# Patient Record
Sex: Female | Born: 1950 | Race: White | Hispanic: No | Smoking: Current every day smoker
Health system: Southern US, Community
[De-identification: ages and names within clinical notes are randomized; demographics above are authoritative.]

## PROBLEM LIST (undated history)

## (undated) DIAGNOSIS — R42 Dizziness and giddiness: Secondary | ICD-10-CM

## (undated) DIAGNOSIS — F419 Anxiety disorder, unspecified: Secondary | ICD-10-CM

## (undated) DIAGNOSIS — I10 Essential (primary) hypertension: Secondary | ICD-10-CM

## (undated) DIAGNOSIS — F32A Depression, unspecified: Secondary | ICD-10-CM

## (undated) DIAGNOSIS — M1990 Unspecified osteoarthritis, unspecified site: Secondary | ICD-10-CM

## (undated) DIAGNOSIS — I428 Other cardiomyopathies: Secondary | ICD-10-CM

## (undated) DIAGNOSIS — I519 Heart disease, unspecified: Secondary | ICD-10-CM

## (undated) DIAGNOSIS — R634 Abnormal weight loss: Secondary | ICD-10-CM

## (undated) DIAGNOSIS — F17200 Nicotine dependence, unspecified, uncomplicated: Secondary | ICD-10-CM

## (undated) DIAGNOSIS — R233 Spontaneous ecchymoses: Secondary | ICD-10-CM

## (undated) DIAGNOSIS — J4520 Mild intermittent asthma, uncomplicated: Secondary | ICD-10-CM

## (undated) DIAGNOSIS — E7800 Pure hypercholesterolemia, unspecified: Secondary | ICD-10-CM

## (undated) HISTORY — PX: OVARIAN CYST REMOVAL: SHX89

---

## 2005-11-04 ENCOUNTER — Ambulatory Visit: Payer: Self-pay | Admitting: Family Medicine

## 2005-12-02 ENCOUNTER — Ambulatory Visit: Payer: Self-pay | Admitting: Family Medicine

## 2005-12-19 ENCOUNTER — Ambulatory Visit: Payer: Self-pay | Admitting: Gastroenterology

## 2006-01-15 ENCOUNTER — Ambulatory Visit: Payer: Self-pay | Admitting: Internal Medicine

## 2006-01-30 ENCOUNTER — Ambulatory Visit: Payer: Self-pay | Admitting: Internal Medicine

## 2006-09-23 ENCOUNTER — Ambulatory Visit: Payer: Self-pay | Admitting: Family Medicine

## 2008-09-17 ENCOUNTER — Ambulatory Visit: Payer: Self-pay | Admitting: Internal Medicine

## 2009-04-22 ENCOUNTER — Ambulatory Visit: Payer: Self-pay | Admitting: Internal Medicine

## 2010-04-18 ENCOUNTER — Ambulatory Visit: Payer: Self-pay | Admitting: Family Medicine

## 2011-01-29 ENCOUNTER — Ambulatory Visit: Payer: Self-pay | Admitting: Gastroenterology

## 2011-10-29 DIAGNOSIS — F17210 Nicotine dependence, cigarettes, uncomplicated: Secondary | ICD-10-CM | POA: Insufficient documentation

## 2011-10-29 DIAGNOSIS — I429 Cardiomyopathy, unspecified: Secondary | ICD-10-CM | POA: Insufficient documentation

## 2011-10-29 DIAGNOSIS — Z720 Tobacco use: Secondary | ICD-10-CM | POA: Insufficient documentation

## 2012-05-06 ENCOUNTER — Ambulatory Visit: Payer: Self-pay | Admitting: Family Medicine

## 2012-11-03 DIAGNOSIS — F32A Depression, unspecified: Secondary | ICD-10-CM | POA: Insufficient documentation

## 2012-11-03 DIAGNOSIS — F329 Major depressive disorder, single episode, unspecified: Secondary | ICD-10-CM | POA: Insufficient documentation

## 2013-07-22 ENCOUNTER — Ambulatory Visit: Payer: Self-pay | Admitting: Family Medicine

## 2013-08-02 ENCOUNTER — Ambulatory Visit: Payer: Self-pay

## 2013-08-05 LAB — BETA STREP CULTURE(ARMC)

## 2013-08-31 ENCOUNTER — Ambulatory Visit: Payer: Self-pay | Admitting: Gastroenterology

## 2014-07-31 ENCOUNTER — Ambulatory Visit: Payer: Self-pay | Admitting: Physician Assistant

## 2014-07-31 LAB — URINALYSIS, COMPLETE
Bacteria: NEGATIVE
Bilirubin,UR: NEGATIVE
GLUCOSE, UR: NEGATIVE
Ketone: NEGATIVE
Leukocyte Esterase: NEGATIVE
Nitrite: NEGATIVE
PH: 6 (ref 5.0–8.0)
PROTEIN: NEGATIVE
SPECIFIC GRAVITY: 1.02 (ref 1.000–1.030)

## 2014-08-02 LAB — URINE CULTURE

## 2015-05-23 ENCOUNTER — Other Ambulatory Visit: Payer: Self-pay | Admitting: Family Medicine

## 2015-05-23 DIAGNOSIS — J4521 Mild intermittent asthma with (acute) exacerbation: Secondary | ICD-10-CM

## 2015-07-09 ENCOUNTER — Ambulatory Visit
Admission: EM | Admit: 2015-07-09 | Discharge: 2015-07-09 | Disposition: A | Discharge status: Home / Self Care | Payer: 59 | Attending: Internal Medicine | Admitting: Internal Medicine

## 2015-07-09 ENCOUNTER — Encounter: Payer: Self-pay | Admitting: *Deleted

## 2015-07-09 DIAGNOSIS — I10 Essential (primary) hypertension: Secondary | ICD-10-CM | POA: Insufficient documentation

## 2015-07-09 DIAGNOSIS — R110 Nausea: Secondary | ICD-10-CM | POA: Diagnosis present

## 2015-07-09 DIAGNOSIS — F419 Anxiety disorder, unspecified: Secondary | ICD-10-CM | POA: Diagnosis not present

## 2015-07-09 DIAGNOSIS — E780 Pure hypercholesterolemia: Secondary | ICD-10-CM | POA: Diagnosis not present

## 2015-07-09 DIAGNOSIS — Z79899 Other long term (current) drug therapy: Secondary | ICD-10-CM | POA: Insufficient documentation

## 2015-07-09 DIAGNOSIS — N3001 Acute cystitis with hematuria: Secondary | ICD-10-CM | POA: Insufficient documentation

## 2015-07-09 DIAGNOSIS — R509 Fever, unspecified: Secondary | ICD-10-CM | POA: Diagnosis present

## 2015-07-09 DIAGNOSIS — M545 Low back pain: Secondary | ICD-10-CM | POA: Diagnosis present

## 2015-07-09 DIAGNOSIS — F17210 Nicotine dependence, cigarettes, uncomplicated: Secondary | ICD-10-CM | POA: Diagnosis not present

## 2015-07-09 HISTORY — DX: Anxiety disorder, unspecified: F41.9

## 2015-07-09 HISTORY — DX: Heart disease, unspecified: I51.9

## 2015-07-09 HISTORY — DX: Essential (primary) hypertension: I10

## 2015-07-09 HISTORY — DX: Pure hypercholesterolemia, unspecified: E78.00

## 2015-07-09 LAB — URINALYSIS COMPLETE WITH MICROSCOPIC (ARMC ONLY)
BACTERIA UA: NONE SEEN — AB
Bilirubin Urine: NEGATIVE
Glucose, UA: NEGATIVE mg/dL
Ketones, ur: NEGATIVE mg/dL
Leukocytes, UA: NEGATIVE
Nitrite: NEGATIVE
Protein, ur: NEGATIVE mg/dL
WBC, UA: NONE SEEN WBC/hpf (ref <3)
pH: 6.5 (ref 5.0–8.0)

## 2015-07-09 MED ORDER — NITROFURANTOIN MONOHYD MACRO 100 MG PO CAPS: 100.0000 mg | ORAL_CAPSULE | Freq: Two times a day (BID) | ORAL | Status: DC

## 2015-07-09 MED ORDER — PROMETHAZINE HCL 25 MG PO TABS: 25.0000 mg | ORAL_TABLET | Freq: Four times a day (QID) | ORAL | Status: DC | PRN

## 2015-07-09 NOTE — ED Provider Notes (Signed)
CSN: [id number]     Arrival date & time 07/09/15  1014 History   First MD Initiated Contact with Patient 07/09/15 1200     Chief Complaint  Patient presents with  . Urinary Tract Infection   HPI Patient is a 64 year old lady who presents today with malaise, fever to 101 last night. Low back discomfort, radiates forward bilaterally to the suprapubic area. Really feels achy all over. Very nauseous, but no vomiting. No diarrhea. No change in bowel habits. No dysuria, no hesitancy, no frequency, but did have an unexpected episode of urge incontinence last night. No unusual vaginal bleeding or discharge. Has a little nasal congestion and a little fullness in her anterior neck glands, but is renovating her kitchen and it's very dusty.  Past Medical History  Diagnosis Date  . Hypertension   . Anxiety   . High cholesterol   . 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 Ovarian cyst removal      History  Substance Use Topics  . Smoking status: Current Every Day Smoker -- 1.00 packs/day    Types: Cigarettes  . Smokeless tobacco: Not on file  . Alcohol Use: No    Review of Systems  All other systems reviewed and are negative.   Allergies  Review of patient's allergies indicates no known allergies.  Home Medications   Prior to Admission medications   Medication Sig Start Date End Date Taking? Authorizing Provider  atorvastatin (LIPITOR) 10 MG tablet Take 10 mg by mouth daily.   Yes Historical Provider, MD  carvedilol (COREG) 3.125 MG tablet Take 3.125 mg by mouth 2 (two) times daily with a meal.   Yes Historical Provider, MD  sertraline (ZOLOFT) 100 MG tablet Take 100 mg by mouth daily.   Yes Historical Provider, MD  Valsartan (DIOVAN PO) Take by mouth.   Yes Historical Provider, MD  montelukast (SINGULAIR) 10 MG tablet TAKE 1 TABLET BY MOUTH EVERY DAY 05/25/15   Duanne Limerick, MD                 BP 109/66 mmHg  Pulse 60   Temp(Src) 98.7 F (37.1 C) (Oral)  Resp 20  Ht  (1.676 m)  Wt 150 lb (68.04 kg)  BMI 24.22 kg/m2  SpO2 96% Physical Exam  Constitutional: She is oriented to person, place, and time. No distress.  Alert, nicely groomed  HENT:  Head: Atraumatic.  Eyes:  Conjugate gaze, no eye redness/drainage  Neck: Neck supple.  Cardiovascular: Normal rate.   Pulmonary/Chest: No respiratory distress.  Abdominal: She exhibits no distension.  Musculoskeletal: Normal range of motion.  No leg swelling  Neurological: She is alert and oriented to person, place, and time.  Skin: Skin is warm and dry.  No cyanosis  Nursing note and vitals reviewed.   ED Course  Procedures  Results for orders placed or performed during the hospital encounter of 07/09/15  Urinalysis complete, with microscopic  Result Value Ref Range   Color, Urine YELLOW YELLOW   APPearance CLEAR CLEAR   Glucose, UA NEGATIVE NEGATIVE mg/dL   Bilirubin Urine NEGATIVE NEGATIVE   Ketones, ur NEGATIVE NEGATIVE mg/dL   Specific Gravity, Urine <1.005 (L) 1.005 - 1.030   Hgb urine dipstick 2+ (A) NEGATIVE   pH 6.5 5.0 - 8.0   Protein, ur NEGATIVE NEGATIVE mg/dL   Nitrite NEGATIVE NEGATIVE   Leukocytes, UA NEGATIVE NEGATIVE   RBC /  HPF 6-30 <3 RBC/hpf   WBC, UA NONE SEEN <3 WBC/hpf   Bacteria, UA NONE SEEN (A) RARE   Squamous Epithelial / LPF 0-5 (A) RARE   Amorphous Crystal PRESENT    Urine culture pending  MDM   1. Acute cystitis with hematuria    Discharge Medication List as of 07/09/2015 12:12 PM    START taking these medications   Details  nitrofurantoin, macrocrystal-monohydrate, (MACROBID) 100 MG capsule Take 1 capsule (100 mg total) by mouth 2 (two) times daily., Starting 07/09/2015, Until Discontinued, Normal    promethazine (PHENERGAN) 25 MG tablet Take 1 tablet (25 mg total) by mouth every 6 (six) hours as needed for nausea or vomiting., Starting 07/09/2015, Until Discontinued, Normal       Recheck or followup  pcp/Dr Yetta Barre if not improving in a few days. Have urine rechecked in a few weeks to see if rbc's are still present.    Eustace Moore, MD 07/09/15 1726

## 2015-07-09 NOTE — Discharge Instructions (Signed)
Prescription for macrobid (antibiotic) sent to the [facility] in [place] Urine sample had blood in it; urine culture is pending.   Have urine rechecked for blood in a few weeks, to make sure it goes away.  Urinary Tract Infection Urinary tract infections (UTIs) can develop anywhere along your urinary tract. Your urinary tract is your body's drainage system for removing wastes and extra water. Your urinary tract includes two kidneys, two ureters, a bladder, and a urethra. Your kidneys are a pair of bean-shaped organs. Each kidney is about the size of your fist. They are located below your ribs, one on each side of your spine. CAUSES Infections are caused by microbes, which are microscopic organisms, including fungi, viruses, and bacteria. These organisms are so small that they can only be seen through a microscope. Bacteria are the microbes that most commonly cause UTIs. SYMPTOMS  Symptoms of UTIs may vary by age and gender of the patient and by the location of the infection. Symptoms in young women typically include a frequent and intense urge to urinate and a painful, burning feeling in the bladder or urethra during urination. Older women and men are more likely to be tired, shaky, and weak and have muscle aches and abdominal pain. A fever may mean the infection is in your kidneys. Other symptoms of a kidney infection include pain in your back or sides below the ribs, nausea, and vomiting. DIAGNOSIS To diagnose a UTI, your caregiver will ask you about your symptoms. Your caregiver also will ask to provide a urine sample. The urine sample will be tested for bacteria and white blood cells. White blood cells are made by your body to help fight infection. TREATMENT  Typically, UTIs can be treated with medication. Because most UTIs are caused by a bacterial infection, they usually can be treated with the use of antibiotics. The choice of antibiotic and length of treatment depend on your symptoms and the type  of bacteria causing your infection. HOME CARE INSTRUCTIONS  If you were prescribed antibiotics, take them exactly as your caregiver instructs you. Finish the medication even if you feel better after you have only taken some of the medication.  Drink enough water and fluids to keep your urine clear or pale yellow.  Avoid caffeine, tea, and carbonated beverages. They tend to irritate your bladder.  Empty your bladder often. Avoid holding urine for long periods of time.  Empty your bladder before and after sexual intercourse.  After a bowel movement, women should cleanse from front to back. Use each tissue only once. SEEK MEDICAL CARE IF:   You have back pain.  You develop a fever.  Your symptoms do not begin to resolve within 3 days. SEEK IMMEDIATE MEDICAL CARE IF:   You have severe back pain or lower abdominal pain.  You develop chills.  You have nausea or vomiting.  You have continued burning or discomfort with urination. MAKE SURE YOU:   Understand these instructions.  Will watch your condition.  Will get help right away if you are not doing well or get worse. Document Released: 08/28/2005 Document Revised: 05/19/2012 Document Reviewed: 12/27/2011 [facility] Patient Information 2015 [place], [place]. This information is not intended to replace advice given to you by your health care provider. Make sure you discuss any questions you have with your health care provider.

## 2015-07-09 NOTE — ED Notes (Signed)
Patient c/o lower back pain and lower pelvic / urge to go x yesterday.

## 2015-07-11 ENCOUNTER — Ambulatory Visit (INDEPENDENT_AMBULATORY_CARE_PROVIDER_SITE_OTHER): Payer: 59 | Admitting: Family Medicine

## 2015-07-11 ENCOUNTER — Encounter: Payer: Self-pay | Admitting: Family Medicine

## 2015-07-11 VITALS — BP 120/80 | HR 64 | Temp 100.9°F | Ht 66.0 in | Wt 147.0 lb

## 2015-07-11 DIAGNOSIS — N10 Acute tubulo-interstitial nephritis: Secondary | ICD-10-CM | POA: Diagnosis not present

## 2015-07-11 LAB — POCT URINALYSIS DIPSTICK
Bilirubin, UA: NEGATIVE
Glucose, UA: NEGATIVE
KETONES UA: NEGATIVE
Nitrite, UA: NEGATIVE
Protein, UA: NEGATIVE
Spec Grav, UA: <=1.005
UROBILINOGEN UA: 0.2
pH, UA: 6

## 2015-07-11 LAB — URINE CULTURE

## 2015-07-11 MED ORDER — LEVOFLOXACIN 500 MG PO TABS: 500.0000 mg | ORAL_TABLET | Freq: Every day | ORAL | Status: DC

## 2015-07-11 NOTE — Progress Notes (Signed)
Name: Marilyn Molina   MRN: [id number]    DOB: 1950-12-31   Date:07/11/2015       Progress Note  Subjective  Chief Complaint  Chief Complaint  Patient presents with  . Back Pain    had blood in urine  . Sinusitis    with headache    Back Pain This is a new problem. The current episode started in the past 7 days. The problem occurs daily. The problem has been waxing and waning since onset. The pain is present in the sacro-iliac. The quality of the pain is described as aching. The pain does not radiate. The pain is at a severity of 6/10. The pain is moderate. Associated symptoms include bladder incontinence and a fever. Pertinent negatives include no abdominal pain, bowel incontinence, chest pain, dysuria, headaches, leg pain, numbness, paresis, paresthesias, pelvic pain, tingling or weight loss. Treatments tried: macrobid. The treatment provided no relief.  Sinusitis This is a recurrent problem. The current episode started in the past 7 days. The problem has been waxing and waning since onset. The maximum temperature recorded prior to her arrival was 100.4 - 100.9 F. The fever has been present for 3 to 4 days. Her pain is at a severity of 4/10. Associated symptoms include chills, congestion, coughing, sinus pressure and a sore throat. Pertinent negatives include no diaphoresis, ear pain, headaches, hoarse voice, neck pain or shortness of breath. Past treatments include antibiotics.  Fever  This is a new problem. The current episode started in the past 7 days. The problem occurs daily. The problem has been waxing and waning. The maximum temperature noted was 100 to 100.9 F. Associated symptoms include congestion, coughing, diarrhea, nausea and a sore throat. Pertinent negatives include no abdominal pain, chest pain, ear pain, headaches, muscle aches, rash, urinary pain or wheezing. She has tried acetaminophen for the symptoms.    No problem-specific assessment & plan notes found for this  encounter.   Past Medical History  Diagnosis Date  . Hypertension   . Anxiety   . High cholesterol   . 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 Ovarian cyst removal      History reviewed. No pertinent family history.  History   Social History  . Marital Status: Married    Spouse Name: N/A  . Number of Children: N/A  . Years of Education: N/A   Occupational History  . Not on file.   Social History Main Topics  . Smoking status: Current Every Day Smoker -- 1.00 packs/day    Types: Cigarettes  . Smokeless tobacco: Not on file  . Alcohol Use: No  . Drug Use: No  . Sexual Activity: No   Other Topics Concern  . Not on file   Social History Narrative    No Known Allergies   Review of Systems  Constitutional: Positive for fever and chills. Negative for weight loss, malaise/fatigue and diaphoresis.  HENT: Positive for congestion, sinus pressure and sore throat. Negative for ear discharge, ear pain and hoarse voice.   Eyes: Negative for blurred vision.  Respiratory: Positive for cough. Negative for sputum production, shortness of breath and wheezing.   Cardiovascular: Negative for chest pain, palpitations and leg swelling.  Gastrointestinal: Positive for nausea and diarrhea. Negative for heartburn, abdominal pain, constipation, blood in stool, melena and bowel incontinence.  Genitourinary: Positive for bladder incontinence. Negative for dysuria, urgency, frequency, hematuria and pelvic  pain.  Musculoskeletal: Positive for back pain. Negative for myalgias, joint pain and neck pain.  Skin: Negative for rash.  Neurological: Negative for dizziness, tingling, sensory change, focal weakness, numbness, headaches and paresthesias.  Endo/Heme/Allergies: Negative for environmental allergies and polydipsia. Does not bruise/bleed easily.  Psychiatric/Behavioral: Negative for depression and suicidal ideas. The patient is not  nervous/anxious and does not have insomnia.      Objective  Filed Vitals:   07/11/15 1357  BP: 120/80  Pulse: 64  Temp: 100.9 F (38.3 C)  TempSrc: Oral  Height:  (1.676 m)  Weight: 147 lb (66.679 kg)    Physical Exam  Constitutional: She is well-developed, well-nourished, and in no distress. No distress.  HENT:  Head: Normocephalic and atraumatic.  Right Ear: External ear normal.  Left Ear: External ear normal.  Nose: Nose normal.  Mouth/Throat: Oropharynx is clear and moist.  Eyes: Conjunctivae and EOM are normal. Pupils are equal, round, and reactive to light. Right eye exhibits no discharge. Left eye exhibits no discharge.  Neck: Normal range of motion. Neck supple. No JVD present. No thyromegaly present.  Cardiovascular: Normal rate, regular rhythm, normal heart sounds and intact distal pulses.  Exam reveals no gallop and no friction rub.   No murmur heard. Pulmonary/Chest: Effort normal and breath sounds normal.  Abdominal: Soft. Bowel sounds are normal. She exhibits no mass. There is no tenderness. There is no guarding.  Musculoskeletal: Normal range of motion. She exhibits no edema.  Lymphadenopathy:    She has no cervical adenopathy.  Neurological: She is alert. She has normal reflexes.  Skin: Skin is warm and dry. She is not diaphoretic.  Psychiatric: Mood and affect normal.      Assessment & Plan  Problem List Items Addressed This Visit    None    Visit Diagnoses    Acute pyelonephritis    -  Primary    Relevant Medications    levofloxacin (LEVAQUIN) 500 MG tablet    Other Relevant Orders    CBC w/Diff/Platelet    POCT Urinalysis Dipstick    Urine Culture    Rocky mtn spotted fvr ab, IgM-blood         Dr. [facility]  07/11/2015

## 2015-07-12 ENCOUNTER — Other Ambulatory Visit: Payer: Self-pay

## 2015-07-12 LAB — CBC WITH DIFFERENTIAL/PLATELET
BASOS: 0 %
Basophils Absolute: 0 10*3/uL (ref 0.0–0.2)
EOS (ABSOLUTE): 0 10*3/uL (ref 0.0–0.4)
Eos: 0 %
HEMOGLOBIN: 13.4 g/dL (ref 11.1–15.9)
Hematocrit: 39.5 % (ref 34.0–46.6)
Immature Grans (Abs): 0 10*3/uL (ref 0.0–0.1)
Immature Granulocytes: 0 %
LYMPHS: 14 %
Lymphocytes Absolute: 1.9 10*3/uL (ref 0.7–3.1)
MCH: 30.8 pg (ref 26.6–33.0)
MCHC: 33.9 g/dL (ref 31.5–35.7)
MCV: 91 fL (ref 79–97)
MONOCYTES: 8 %
Monocytes Absolute: 1.1 10*3/uL — ABNORMAL HIGH (ref 0.1–0.9)
NEUTROS ABS: 10.5 10*3/uL — AB (ref 1.4–7.0)
Neutrophils: 78 %
Platelets: 257 10*3/uL (ref 150–379)
RBC: 4.35 x10E6/uL (ref 3.77–5.28)
RDW: 15.3 % (ref 12.3–15.4)
WBC: 13.7 10*3/uL — ABNORMAL HIGH (ref 3.4–10.8)

## 2015-07-13 ENCOUNTER — Encounter: Payer: Self-pay | Admitting: Family Medicine

## 2015-07-13 ENCOUNTER — Ambulatory Visit (INDEPENDENT_AMBULATORY_CARE_PROVIDER_SITE_OTHER): Payer: 59 | Admitting: Family Medicine

## 2015-07-13 VITALS — BP 120/64 | HR 68 | Temp 98.9°F | Ht 66.0 in | Wt 147.0 lb

## 2015-07-13 DIAGNOSIS — K5732 Diverticulitis of large intestine without perforation or abscess without bleeding: Secondary | ICD-10-CM | POA: Diagnosis not present

## 2015-07-13 LAB — ROCKY MTN SPOTTED FVR AB, IGM-BLOOD: RMSF IgM: 0.81 index (ref 0.00–0.89)

## 2015-07-13 LAB — URINE CULTURE

## 2015-07-13 MED ORDER — METRONIDAZOLE 500 MG PO TABS: 500.0000 mg | ORAL_TABLET | Freq: Three times a day (TID) | ORAL | Status: DC

## 2015-07-13 NOTE — Progress Notes (Signed)
Name: Barrett Holthaus   MRN: [id number]    DOB: 09-Oct-1951   Date:07/13/2015       Progress Note  Subjective  Chief Complaint  Chief Complaint  Patient presents with  . Fever    follow up for fever and elevated WBC count- RMSF normal    Fever  This is a new problem. The current episode started in the past 7 days. The problem has been gradually improving. The maximum temperature noted was 99 to 99.9 F. Pertinent negatives include no abdominal pain, chest pain, congestion, coughing, diarrhea, ear pain, headaches, muscle aches, nausea, rash, sleepiness, sore throat, urinary pain, vomiting or wheezing. She has tried fluids for the symptoms. The treatment provided mild relief.    No problem-specific assessment & plan notes found for this encounter.   Past Medical History  Diagnosis Date  . Hypertension   . Anxiety   . High cholesterol   . 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 Ovarian cyst removal      History reviewed. No pertinent family history.  Social History   Social History  . Marital Status: Married    Spouse Name: N/A  . Number of Children: N/A  . Years of Education: N/A   Occupational History  . Not on file.   Social History Main Topics  . Smoking status: Current Every Day Smoker -- 1.00 packs/day    Types: Cigarettes  . Smokeless tobacco: Not on file  . Alcohol Use: No  . Drug Use: No  . Sexual Activity: No   Other Topics Concern  . Not on file   Social History Narrative    No Known Allergies   Review of Systems  Constitutional: Positive for fever. Negative for chills, weight loss and malaise/fatigue.  HENT: Negative for congestion, ear discharge, ear pain and sore throat.   Eyes: Negative for blurred vision.  Respiratory: Negative for cough, sputum production, shortness of breath and wheezing.   Cardiovascular: Negative for chest pain, palpitations and leg swelling.  Gastrointestinal:  Negative for heartburn, nausea, vomiting, abdominal pain, diarrhea, constipation, blood in stool and melena.  Genitourinary: Negative for dysuria, urgency, frequency and hematuria.  Musculoskeletal: Negative for myalgias, back pain, joint pain and neck pain.  Skin: Negative for rash.  Neurological: Negative for dizziness, tingling, sensory change, focal weakness and headaches.  Endo/Heme/Allergies: Negative for environmental allergies and polydipsia. Does not bruise/bleed easily.  Psychiatric/Behavioral: Negative for depression and suicidal ideas. The patient is not nervous/anxious and does not have insomnia.      Objective  Filed Vitals:   07/13/15 1442  BP: 120/64  Pulse: 68  Temp: 98.9 F (37.2 C)  TempSrc: Oral  Height:  (1.676 m)  Weight: 147 lb (66.679 kg)    Physical Exam  Constitutional: She is well-developed, well-nourished, and in no distress. No distress.  HENT:  Head: Normocephalic and atraumatic.  Right Ear: External ear normal.  Left Ear: External ear normal.  Nose: Nose normal.  Mouth/Throat: Oropharynx is clear and moist.  Eyes: Conjunctivae and EOM are normal. Pupils are equal, round, and reactive to light. Right eye exhibits no discharge. Left eye exhibits no discharge.  Neck: Normal range of motion. Neck supple. No JVD present. No thyromegaly present.  Cardiovascular: Normal rate, regular rhythm, normal heart sounds and intact distal pulses.  Exam reveals no gallop and no friction rub.   No murmur heard. Pulmonary/Chest: Effort normal  and breath sounds normal.  Abdominal: Soft. Bowel sounds are normal. She exhibits no mass. There is tenderness in the left lower quadrant. There is no rigidity, no rebound, no guarding and no CVA tenderness.  Musculoskeletal: Normal range of motion. She exhibits no edema.  Lymphadenopathy:    She has no cervical adenopathy.  Neurological: She is alert. She has normal reflexes.  Skin: Skin is warm and dry. She is not  diaphoretic.  Psychiatric: Mood and affect normal.      Assessment & Plan  Problem List Items Addressed This Visit    None    Visit Diagnoses    Diverticulitis of large intestine without perforation or abscess without bleeding    -  Primary    Relevant Medications    metroNIDAZOLE (FLAGYL) 500 MG tablet         Dr. [facility]  07/13/2015

## 2015-07-19 IMAGING — MG MM DIGITAL SCREENING BILAT W/ CAD
1 series · 4 of 4 positions shown · non-contrast
Comparison: None.

REASON FOR EXAM: scr mammo no order
COMMENTS:

PROCEDURE:     MMM - MMM DGT SCR NO ORDER W/CAD  - July 22, 2013  [DATE]
CLINICAL DATA: Screening.
DIGITAL SCREENING BILATERAL MAMMOGRAM WITH CAD

[R CC · right · 4 of 4 slices shown]
[im 1/4]
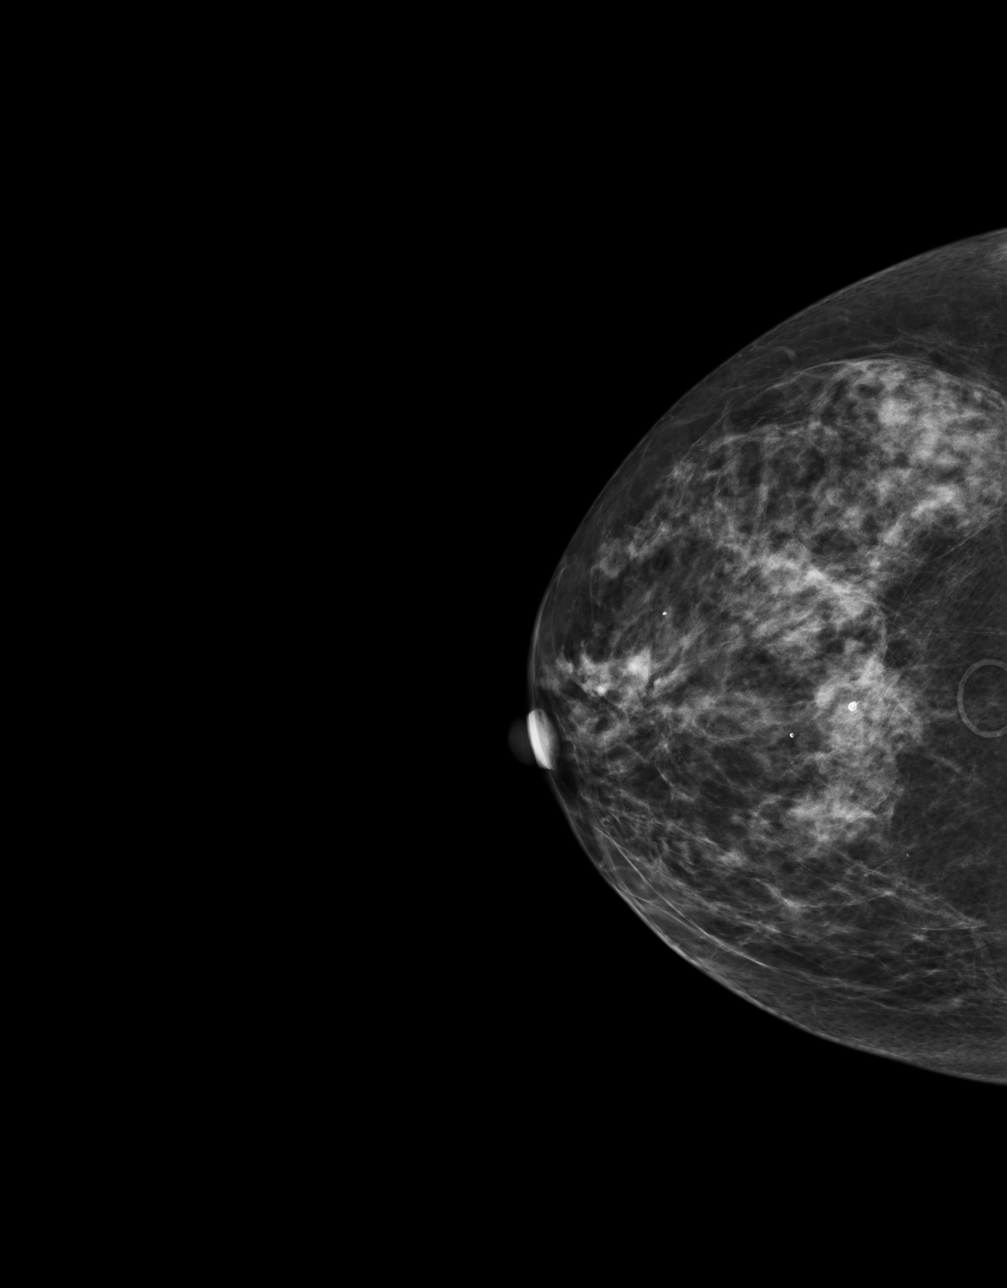
[im 2/4]
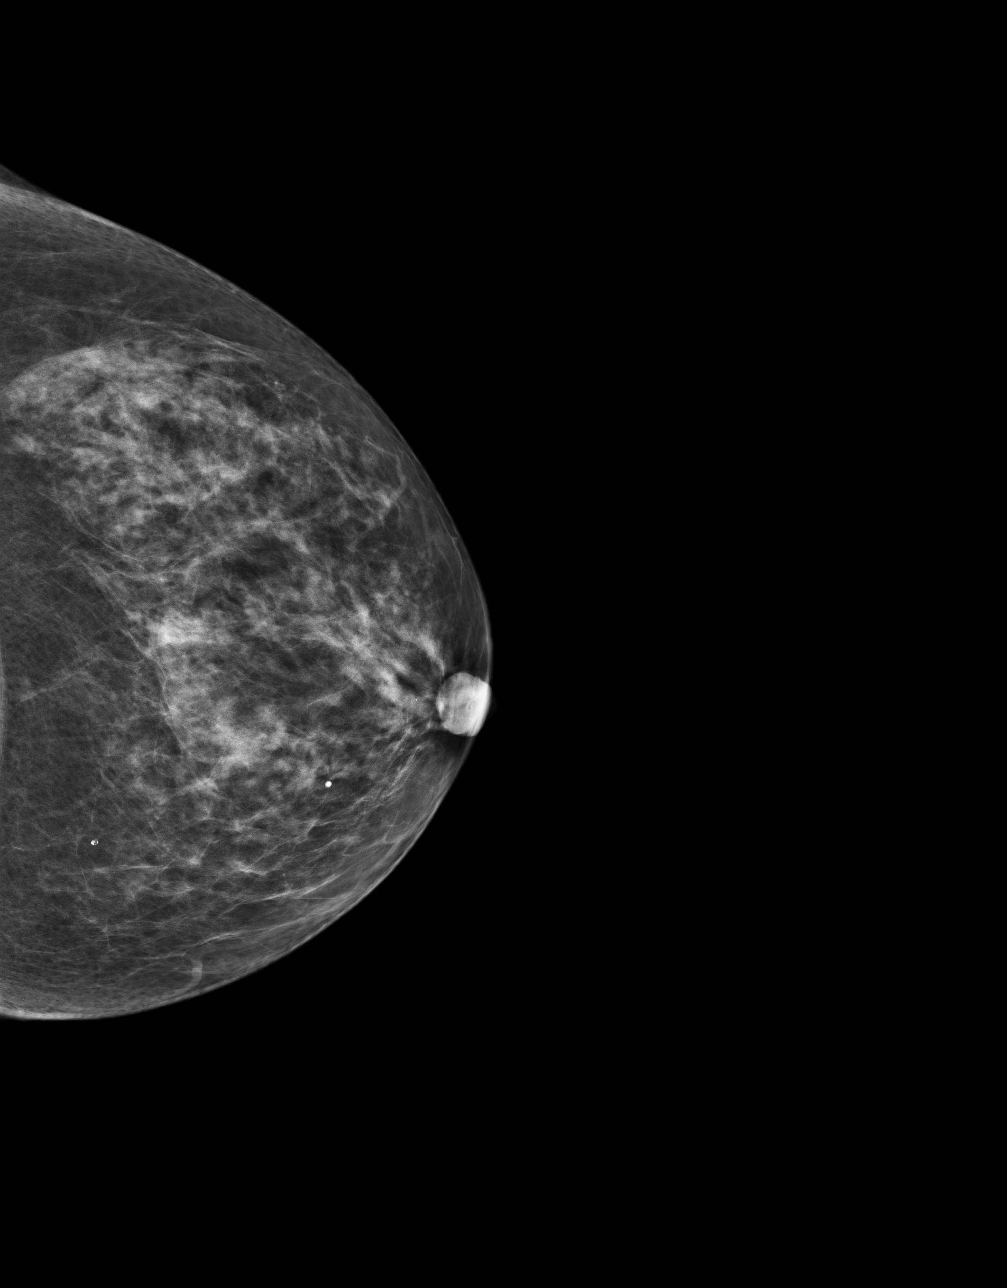
[im 3/4]
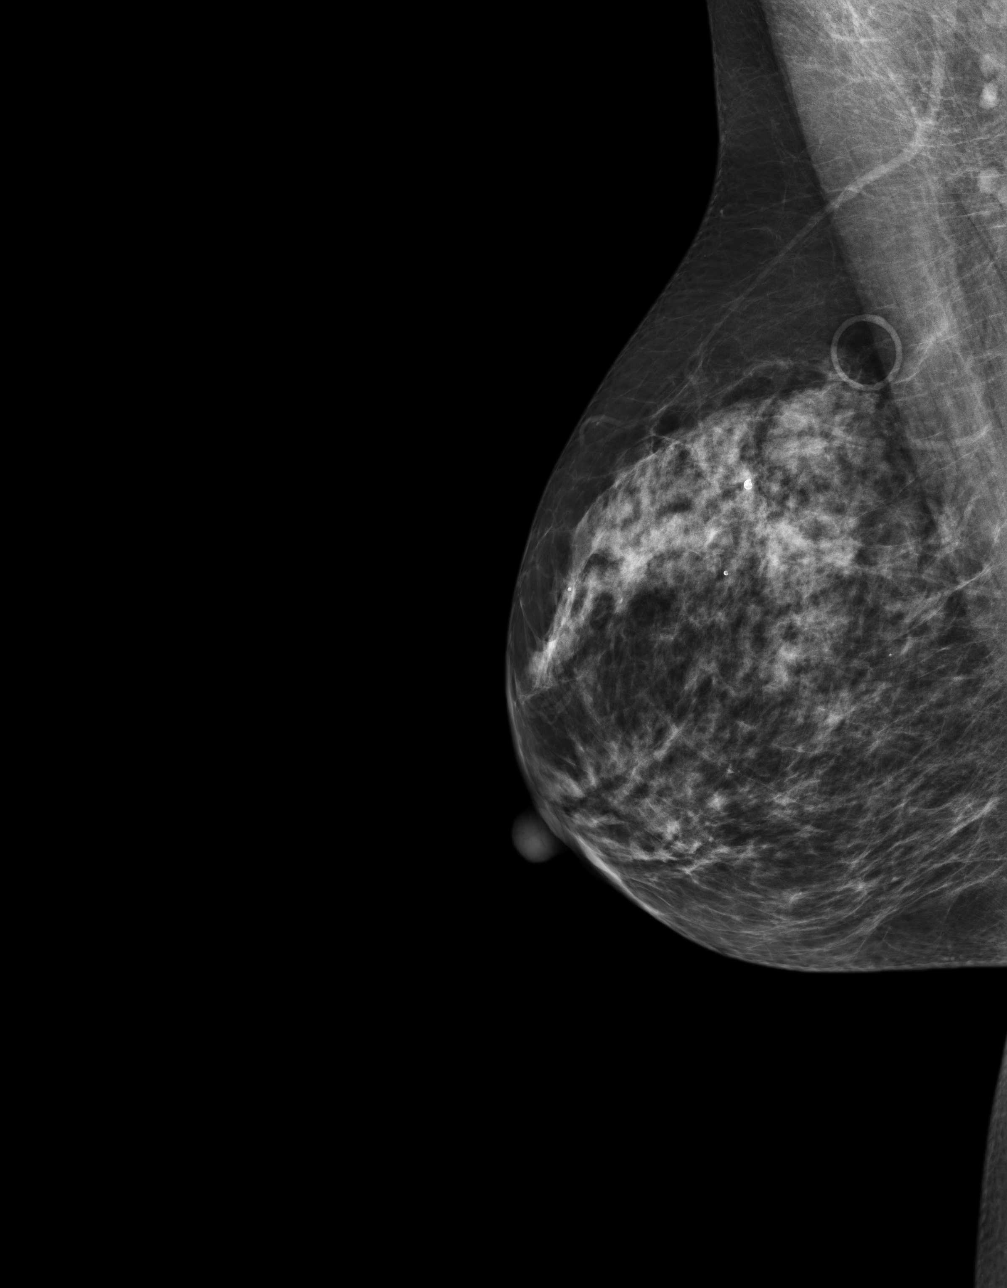
[im 4/4]
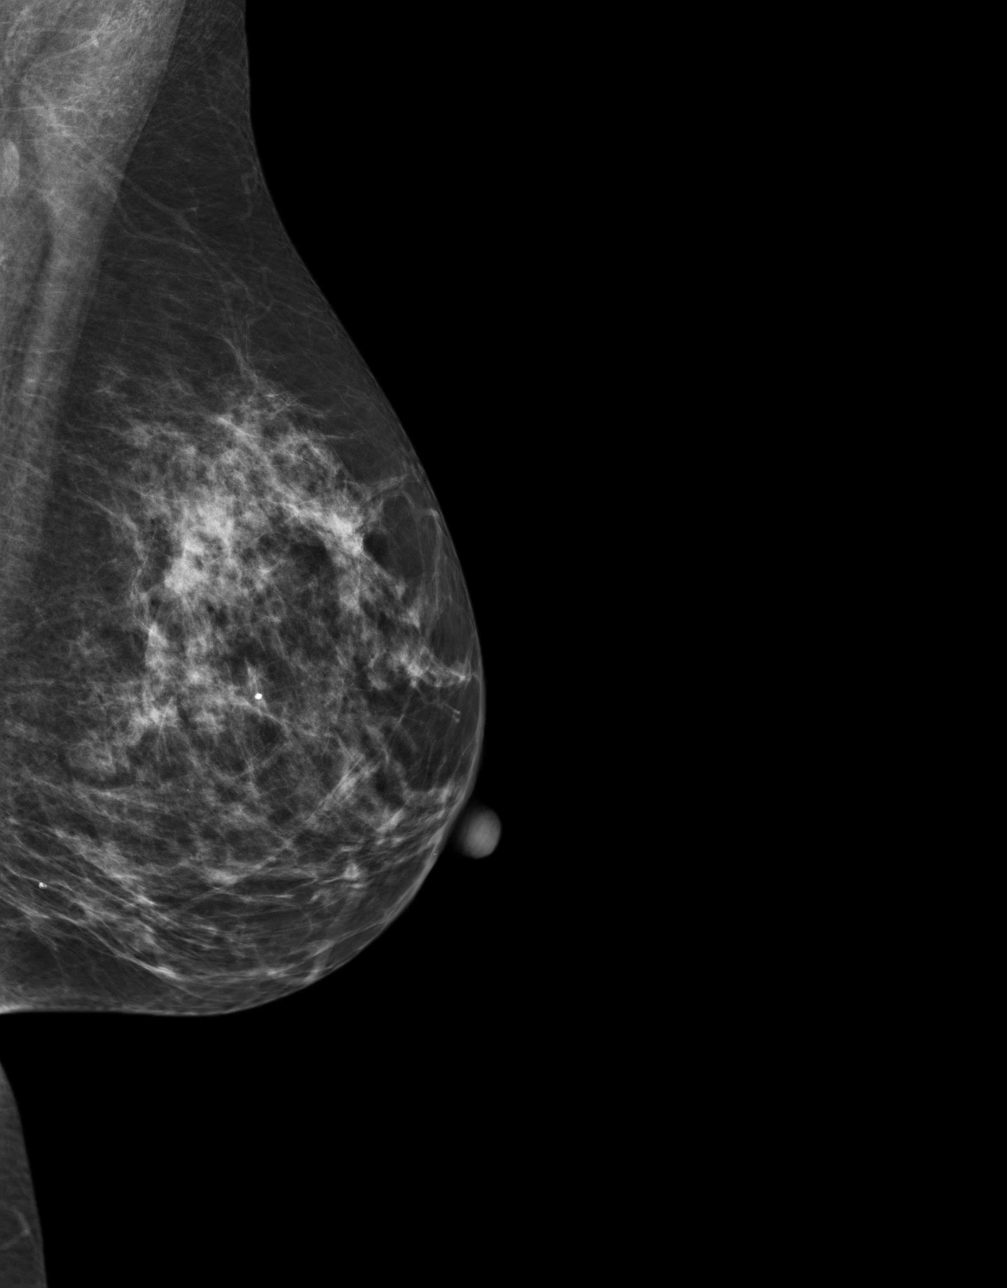

[4 of 4 positions shown; findings below may reference images not displayed]

FINDINGS: ACR Breast Density Category c:  The breast tissue is
heterogeneously dense, which may obscure small masses.

There are no findings suspicious for malignancy.

Images were processed with CAD.
IMPRESSION: No mammographic evidence of malignancy.

A result letter of this screening mammogram will be mailed directly
to the patient.

RECOMMENATION:
Screening mammogram in one year. (Code:JI-K-T8D)

BI-RADS CATEGORY 1:  Negative.

## 2015-08-28 ENCOUNTER — Other Ambulatory Visit: Payer: Self-pay | Admitting: Family Medicine

## 2015-08-28 DIAGNOSIS — F329 Major depressive disorder, single episode, unspecified: Secondary | ICD-10-CM

## 2015-08-28 DIAGNOSIS — F32A Depression, unspecified: Secondary | ICD-10-CM

## 2015-10-18 ENCOUNTER — Other Ambulatory Visit: Payer: Self-pay | Admitting: Family Medicine

## 2015-11-21 DIAGNOSIS — I10 Essential (primary) hypertension: Secondary | ICD-10-CM | POA: Insufficient documentation

## 2015-11-21 DIAGNOSIS — F419 Anxiety disorder, unspecified: Secondary | ICD-10-CM | POA: Insufficient documentation

## 2016-03-26 ENCOUNTER — Ambulatory Visit (INDEPENDENT_AMBULATORY_CARE_PROVIDER_SITE_OTHER): Payer: BLUE CROSS/BLUE SHIELD | Admitting: Family Medicine

## 2016-03-26 ENCOUNTER — Encounter: Payer: Self-pay | Admitting: Family Medicine

## 2016-03-26 VITALS — BP 120/70 | HR 78 | Ht 66.0 in | Wt 146.0 lb

## 2016-03-26 DIAGNOSIS — J0100 Acute maxillary sinusitis, unspecified: Secondary | ICD-10-CM

## 2016-03-26 DIAGNOSIS — F419 Anxiety disorder, unspecified: Principal | ICD-10-CM

## 2016-03-26 DIAGNOSIS — F418 Other specified anxiety disorders: Secondary | ICD-10-CM

## 2016-03-26 DIAGNOSIS — F329 Major depressive disorder, single episode, unspecified: Secondary | ICD-10-CM

## 2016-03-26 DIAGNOSIS — F32A Depression, unspecified: Secondary | ICD-10-CM

## 2016-03-26 MED ORDER — ALPRAZOLAM 0.25 MG PO TABS: 0.2500 mg | ORAL_TABLET | Freq: Two times a day (BID) | ORAL | Status: DC | PRN

## 2016-03-26 MED ORDER — AMOXICILLIN 500 MG PO CAPS: 500.0000 mg | ORAL_CAPSULE | Freq: Three times a day (TID) | ORAL | Status: DC

## 2016-03-26 MED ORDER — SERTRALINE HCL 50 MG PO TABS: 50.0000 mg | ORAL_TABLET | Freq: Every day | ORAL | Status: DC

## 2016-03-26 NOTE — Progress Notes (Signed)
Name: Marilyn Molina   MRN: [id number]    DOB: [birth year]   Date:03/26/2016       Progress Note  Subjective  Chief Complaint  Chief Complaint  Patient presents with  . Depression    been off med x 2 weeks- stopped abruptly because she ran out of refills  . Sinusitis    Depression        This is a recurrent problem.  The current episode started more than 1 month ago.   The onset quality is gradual.   The problem occurs constantly.  The problem has been gradually improving since onset.  Associated symptoms include no decreased concentration, no fatigue, no helplessness, no hopelessness, does not have insomnia, not irritable, no restlessness, no decreased interest, no appetite change, no body aches, no myalgias, no headaches, no indigestion, not sad and no suicidal ideas.     The symptoms are aggravated by family issues.  Past treatments include SSRIs - Selective serotonin reuptake inhibitors.  Compliance with treatment is good. Sinusitis This is a chronic problem. The current episode started more than 1 month ago. The problem has been waxing and waning since onset. There has been no fever. Associated symptoms include congestion, diaphoresis and a sore throat. Pertinent negatives include no chills, coughing, ear pain, headaches, hoarse voice, neck pain, shortness of breath, sinus pressure, sneezing or swollen glands. Past treatments include acetaminophen. The treatment provided mild relief.    No problem-specific assessment & plan notes found for this encounter.   Past Medical History  Diagnosis Date  . Hypertension   . Anxiety   . High cholesterol   . 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 Ovarian cyst removal      History reviewed. No pertinent family history.  Social History   Social History  . Marital Status: Married    Spouse Name: N/A  . Number of Children: N/A  . Years of Education: N/A   Occupational History   . Not on file.   Social History Main Topics  . Smoking status: Current Every Day Smoker -- 1.00 packs/day    Types: Cigarettes  . Smokeless tobacco: Not on file  . Alcohol Use: No  . Drug Use: No  . Sexual Activity: No   Other Topics Concern  . Not on file   Social History Narrative    No Known Allergies   Review of Systems  Constitutional: Positive for diaphoresis. Negative for fever, chills, weight loss, malaise/fatigue, appetite change and fatigue.  HENT: Positive for congestion and sore throat. Negative for ear discharge, ear pain, hoarse voice, sinus pressure and sneezing.   Eyes: Negative for blurred vision.  Respiratory: Negative for cough, sputum production, shortness of breath and wheezing.   Cardiovascular: Negative for chest pain, palpitations and leg swelling.  Gastrointestinal: Negative for heartburn, nausea, abdominal pain, diarrhea, constipation, blood in stool and melena.  Genitourinary: Negative for dysuria, urgency, frequency and hematuria.  Musculoskeletal: Negative for myalgias, back pain, joint pain and neck pain.  Skin: Negative for rash.  Neurological: Negative for dizziness, tingling, sensory change, focal weakness and headaches.  Endo/Heme/Allergies: Negative for environmental allergies and polydipsia. Does not bruise/bleed easily.  Psychiatric/Behavioral: Positive for depression. Negative for suicidal ideas and decreased concentration. The patient is not nervous/anxious and does not have insomnia.      Objective  Filed Vitals:   03/26/16 1513  BP: 120/70  Pulse: 78  Height: 5\' 6"  (1.676 m)  Weight: 146 lb (66.225 kg)    Physical Exam  Constitutional: She is well-developed, well-nourished, and in no distress. She is not irritable. No distress.  HENT:  Head: Normocephalic and atraumatic.  Right Ear: External ear normal.  Left Ear: External ear normal.  Nose: Nose normal.  Mouth/Throat: Oropharynx is clear and moist.  Eyes: Conjunctivae  and EOM are normal. Pupils are equal, round, and reactive to light. Right eye exhibits no discharge. Left eye exhibits no discharge.  Neck: Normal range of motion. Neck supple. No JVD present. No thyromegaly present.  Cardiovascular: Normal rate, regular rhythm, normal heart sounds and intact distal pulses.  Exam reveals no gallop and no friction rub.   No murmur heard. Pulmonary/Chest: Effort normal and breath sounds normal.  Abdominal: Soft. Bowel sounds are normal. She exhibits no mass. There is no tenderness. There is no guarding.  Musculoskeletal: Normal range of motion. She exhibits no edema.  Lymphadenopathy:    She has no cervical adenopathy.  Neurological: She is alert. She has normal reflexes.  Skin: Skin is warm and dry. She is not diaphoretic.  Psychiatric: Mood and affect normal.  Nursing note and vitals reviewed.     Assessment & Plan  Problem List Items Addressed This Visit      Other   Anxiety and depression - Primary   Relevant Medications   sertraline (ZOLOFT) 50 MG tablet   ALPRAZolam (XANAX) 0.25 MG tablet    Other Visit Diagnoses    Acute maxillary sinusitis, recurrence not specified        Relevant Medications    amoxicillin (AMOXIL) 500 MG capsule         Dr. [facility]  03/26/2016

## 2016-04-19 ENCOUNTER — Telehealth: Payer: Self-pay

## 2016-04-19 DIAGNOSIS — J329 Chronic sinusitis, unspecified: Secondary | ICD-10-CM

## 2016-04-19 MED ORDER — DOXYCYCLINE HYCLATE 100 MG PO TABS: 100.0000 mg | ORAL_TABLET | Freq: Two times a day (BID) | ORAL | Status: DC

## 2016-04-19 NOTE — Telephone Encounter (Signed)
Patient called saying that she is not feeling any better. She reports that she still has sinus pain, thick yellow drainage, low grade fever, and upper teeth pain. Patient was prescribed Amoxil 500mg  about 2-3 weeks ago. She is requesting that something else be called in to help with symptoms. Per Dr. Yetta Barre, ok to send in Doxycycline 100 mg BID X 10 days. Patient was advised.

## 2016-04-22 ENCOUNTER — Other Ambulatory Visit: Payer: Self-pay

## 2016-04-22 MED ORDER — SERTRALINE HCL 100 MG PO TABS: 100.0000 mg | ORAL_TABLET | Freq: Every day | ORAL | Status: DC

## 2016-04-23 ENCOUNTER — Other Ambulatory Visit: Payer: Self-pay

## 2016-05-20 ENCOUNTER — Other Ambulatory Visit: Payer: Self-pay | Admitting: Family Medicine

## 2016-07-16 ENCOUNTER — Ambulatory Visit (INDEPENDENT_AMBULATORY_CARE_PROVIDER_SITE_OTHER): Payer: BLUE CROSS/BLUE SHIELD | Admitting: Family Medicine

## 2016-07-16 ENCOUNTER — Encounter: Payer: Self-pay | Admitting: Family Medicine

## 2016-07-16 VITALS — BP 120/70 | HR 70 | Ht 66.0 in | Wt 145.0 lb

## 2016-07-16 DIAGNOSIS — J0100 Acute maxillary sinusitis, unspecified: Secondary | ICD-10-CM

## 2016-07-16 MED ORDER — AMOXICILLIN-POT CLAVULANATE 875-125 MG PO TABS: 1.0000 | ORAL_TABLET | Freq: Two times a day (BID) | ORAL | 0 refills | Status: DC

## 2016-07-16 NOTE — Progress Notes (Signed)
Name: Marilyn Molina   MRN: [id number]    DOB: 06-01-1951   Date:07/16/2016       Progress Note  Subjective  Chief Complaint  Chief Complaint  Patient presents with  . Sinusitis    swollen glands, headache, stopped up/ cong, sore throat    Sinusitis  This is a new problem. The current episode started in the past 7 days. The problem has been gradually worsening since onset. The maximum temperature recorded prior to her arrival was 100.4 - 100.9 F. The fever has been present for 1 to 2 days. The pain is mild. Associated symptoms include chills, congestion, diaphoresis, ear pain, headaches, a hoarse voice, neck pain, sinus pressure, sneezing, a sore throat and swollen glands. Pertinent negatives include no coughing or shortness of breath. Past treatments include acetaminophen. The treatment provided no relief.    No problem-specific Assessment & Plan notes found for this encounter.   Past Medical History:  Diagnosis Date  . Anxiety   . 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 High cholesterol   . Hypertension     Past Surgical History:  Procedure Laterality Date  . OVARIAN CYST REMOVAL      History reviewed. No pertinent family history.  Social History   Social History  . Marital status: Married    Spouse name: N/A  . Number of children: N/A  . Years of education: N/A   Occupational History  . Not on file.   Social History Main Topics  . Smoking status: Current Every Day Smoker    Packs/day: 1.00    Types: Cigarettes  . Smokeless tobacco: Not on file  . Alcohol use No  . Drug use: No  . Sexual activity: No   Other Topics Concern  . Not on file   Social History Narrative  . No narrative on file    No Known Allergies   Review of Systems  Constitutional: Positive for chills and diaphoresis. Negative for fever, malaise/fatigue and weight loss.  HENT: Positive for congestion, ear pain, hoarse voice, sinus pressure, sneezing and sore throat. Negative for  ear discharge.   Eyes: Negative for blurred vision.  Respiratory: Negative for cough, sputum production, shortness of breath and wheezing.   Cardiovascular: Negative for chest pain, palpitations and leg swelling.  Gastrointestinal: Negative for abdominal pain, blood in stool, constipation, diarrhea, heartburn, melena and nausea.  Genitourinary: Negative for dysuria, frequency, hematuria and urgency.  Musculoskeletal: Positive for neck pain. Negative for back pain, joint pain and myalgias.  Skin: Negative for rash.  Neurological: Positive for headaches. Negative for dizziness, tingling, sensory change and focal weakness.  Endo/Heme/Allergies: Negative for environmental allergies and polydipsia. Does not bruise/bleed easily.  Psychiatric/Behavioral: Negative for depression and suicidal ideas. The patient is not nervous/anxious and does not have insomnia.      Objective  Vitals:   07/16/16 0900  BP: 120/70  Pulse: 70  Weight: 145 lb (65.8 kg)  Height: 5\' 6"  (1.676 m)    Physical Exam  Constitutional: She is well-developed, well-nourished, and in no distress. No distress.  HENT:  Head: Normocephalic and atraumatic.  Right Ear: External ear normal.  Left Ear: External ear normal.  Nose: Nose normal.  Mouth/Throat: Oropharynx is clear and moist.  Eyes: Conjunctivae and EOM are normal. Pupils are equal, round, and reactive to light. Right eye exhibits no discharge. Left eye exhibits no discharge.  Neck: Normal range of motion. Neck supple. No JVD present. No thyromegaly present.  Cardiovascular: Normal rate, regular rhythm, normal heart sounds and intact distal pulses.  Exam reveals no gallop and no friction rub.   No murmur heard. Pulmonary/Chest: Effort normal and breath sounds normal.  Abdominal: Soft. Bowel sounds are normal. She exhibits no mass. There is no tenderness. There is no guarding.  Musculoskeletal: Normal range of motion. She exhibits no edema.  Lymphadenopathy:     She has no cervical adenopathy.  Neurological: She is alert.  Skin: Skin is warm and dry. She is not diaphoretic.  Psychiatric: Mood and affect normal.  Nursing note and vitals reviewed.     Assessment & Plan  Problem List Items Addressed This Visit    None    Visit Diagnoses    Acute maxillary sinusitis, recurrence not specified    -  Primary   Relevant Medications   amoxicillin-clavulanate (AUGMENTIN) 875-125 MG tablet        Dr. Hayden Rasmusseneanna [facility]  07/16/16

## 2016-07-25 ENCOUNTER — Other Ambulatory Visit: Payer: Self-pay | Admitting: Family Medicine

## 2016-08-27 ENCOUNTER — Other Ambulatory Visit: Payer: Self-pay | Admitting: Family Medicine

## 2016-11-11 ENCOUNTER — Other Ambulatory Visit: Payer: Self-pay | Admitting: Family Medicine

## 2017-03-17 ENCOUNTER — Ambulatory Visit (INDEPENDENT_AMBULATORY_CARE_PROVIDER_SITE_OTHER): Payer: BLUE CROSS/BLUE SHIELD | Admitting: Family Medicine

## 2017-03-17 VITALS — BP 128/74 | HR 64 | Temp 98.1°F | Ht 66.0 in | Wt 139.0 lb

## 2017-03-17 DIAGNOSIS — J0100 Acute maxillary sinusitis, unspecified: Secondary | ICD-10-CM

## 2017-03-17 MED ORDER — AMOXICILLIN-POT CLAVULANATE 875-125 MG PO TABS: 1.0000 | ORAL_TABLET | Freq: Two times a day (BID) | ORAL | 0 refills | Status: DC

## 2017-03-17 MED ORDER — GUAIFENESIN-CODEINE 100-10 MG/5ML PO SYRP: 5.0000 mL | ORAL_SOLUTION | Freq: Three times a day (TID) | ORAL | 0 refills | Status: DC | PRN

## 2017-03-17 NOTE — Progress Notes (Signed)
Name: Marilyn Molina   MRN: [id number]    DOB: 21-Apr-1951   Date:03/17/2017       Progress Note  Subjective  Chief Complaint  Chief Complaint  Patient presents with  . Headache    X 3 days   . Sinusitis    Face pressure. Fever yesterday.    Headache   This is a new problem. The current episode started in the past 7 days. The problem occurs daily. The problem has been waxing and waning. The pain is located in the frontal region. The quality of the pain is described as aching. The pain is moderate. Associated symptoms include coughing, drainage, sinus pressure and a sore throat. Pertinent negatives include no abdominal pain, back pain, blurred vision, dizziness, ear pain, fever, insomnia, nausea, neck pain, swollen glands, tingling or weight loss. She has tried acetaminophen for the symptoms. The treatment provided mild relief. There is no history of cancer, cluster headaches, hypertension, migraines in the family or sinus disease.  Sinusitis  This is a chronic problem. The problem has been gradually worsening since onset. There has been no fever. The pain is mild. Associated symptoms include congestion, coughing, headaches, sinus pressure and a sore throat. Pertinent negatives include no chills, diaphoresis, ear pain, neck pain, shortness of breath or swollen glands. Past treatments include nothing. The treatment provided mild relief.    No problem-specific Assessment & Plan notes found for this encounter.   Past Medical History:  Diagnosis Date  . Anxiety   . 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 High cholesterol   . Hypertension     Past Surgical History:  Procedure Laterality Date  . OVARIAN CYST REMOVAL      No family history on file.  Social History   Social History  . Marital status: Married    Spouse name: N/A  . Number of children: N/A  . Years of education: N/A   Occupational History  . Not on file.   Social History Main Topics  . Smoking status:  Current Every Day Smoker    Packs/day: 1.00    Types: Cigarettes  . Smokeless tobacco: Not on file  . Alcohol use No  . Drug use: No  . Sexual activity: No   Other Topics Concern  . Not on file   Social History Narrative  . No narrative on file    No Known Allergies  Outpatient Medications Prior to Visit  Medication Sig Dispense Refill  . atorvastatin (LIPITOR) 10 MG tablet Take 10 mg by mouth daily.    . carvedilol (COREG) 3.125 MG tablet Take 3.125 mg by mouth 2 (two) times daily with a meal.    . losartan (COZAAR) 50 MG tablet Take 1 tablet by mouth daily at 6 (six) AM.    . montelukast (SINGULAIR) 10 MG tablet TAKE 1 TABLET BY MOUTH EVERY DAY 30 tablet 5  . sertraline (ZOLOFT) 100 MG tablet TAKE 1 TABLET BY MOUTH DAILY (Patient not taking: Reported on 03/17/2017) 30 tablet 0  . ALPRAZolam (XANAX) 0.25 MG tablet Take 1 tablet (0.25 mg total) by mouth 2 (two) times daily as needed for anxiety. (Patient not taking: Reported on 07/16/2016) 30 tablet 1  . amoxicillin-clavulanate (AUGMENTIN) 875-125 MG tablet Take 1 tablet by mouth 2 (two) times daily. (Patient not taking: Reported on 03/17/2017) 20 tablet 0  . sertraline (ZOLOFT) 100 MG tablet TAKE 1 TABLET BY MOUTH DAILY 30 tablet 1   No facility-administered medications prior  to visit.     Review of Systems  Constitutional: Negative for chills, diaphoresis, fever, malaise/fatigue and weight loss.  HENT: Positive for congestion, sinus pressure and sore throat. Negative for ear discharge and ear pain.   Eyes: Negative for blurred vision.  Respiratory: Positive for cough. Negative for sputum production, shortness of breath and wheezing.   Cardiovascular: Negative for chest pain, palpitations and leg swelling.  Gastrointestinal: Negative for abdominal pain, blood in stool, constipation, diarrhea, heartburn, melena and nausea.  Genitourinary: Negative for dysuria, frequency, hematuria and urgency.  Musculoskeletal: Negative for back  pain, joint pain, myalgias and neck pain.  Skin: Negative for rash.  Neurological: Positive for headaches. Negative for dizziness, tingling, sensory change and focal weakness.  Endo/Heme/Allergies: Negative for environmental allergies and polydipsia. Does not bruise/bleed easily.  Psychiatric/Behavioral: Negative for depression and suicidal ideas. The patient is not nervous/anxious and does not have insomnia.      Objective  Vitals:   03/17/17 1644  BP: 128/74  Pulse: 64  Temp: 98.1 F (36.7 C)  Weight: 139 lb (63 kg)  Height: 5\' 6"  (1.676 m)    Physical Exam  Constitutional: She is well-developed, well-nourished, and in no distress. No distress.  HENT:  Head: Normocephalic and atraumatic.  Right Ear: External ear normal.  Left Ear: External ear normal.  Nose: Nose normal.  Mouth/Throat: Oropharynx is clear and moist.  Eyes: Conjunctivae and EOM are normal. Pupils are equal, round, and reactive to light. Right eye exhibits no discharge. Left eye exhibits no discharge.  Neck: Normal range of motion. Neck supple. No JVD present. No thyromegaly present.  Cardiovascular: Normal rate, regular rhythm, normal heart sounds and intact distal pulses.  Exam reveals no gallop and no friction rub.   No murmur heard. Pulmonary/Chest: Effort normal and breath sounds normal. She has no wheezes. She has no rales.  Abdominal: Soft. Bowel sounds are normal. She exhibits no mass. There is no tenderness. There is no guarding.  Musculoskeletal: Normal range of motion. She exhibits no edema.  Lymphadenopathy:    She has no cervical adenopathy.  Neurological: She is alert. She has normal reflexes.  Skin: Skin is warm and dry. She is not diaphoretic.  Psychiatric: Mood and affect normal.  Nursing note and vitals reviewed.     Assessment & Plan  Problem List Items Addressed This Visit    None    Visit Diagnoses    Acute maxillary sinusitis, recurrence not specified    -  Primary   Relevant  Medications   amoxicillin-clavulanate (AUGMENTIN) 875-125 MG tablet   guaiFENesin-codeine (ROBITUSSIN AC) 100-10 MG/5ML syrup      Meds ordered this encounter  Medications  . amoxicillin-clavulanate (AUGMENTIN) 875-125 MG tablet    Sig: Take 1 tablet by mouth 2 (two) times daily.    Dispense:  20 tablet    Refill:  0  . guaiFENesin-codeine (ROBITUSSIN AC) 100-10 MG/5ML syrup    Sig: Take 5 mLs by mouth 3 (three) times daily as needed for cough.    Dispense:  150 mL    Refill:  0      Dr. [facility]den [facility]  03/17/17

## 2017-05-08 ENCOUNTER — Telehealth: Payer: Self-pay

## 2017-05-08 ENCOUNTER — Other Ambulatory Visit: Payer: Self-pay

## 2017-05-08 MED ORDER — AZITHROMYCIN 250 MG PO TABS: ORAL_TABLET | ORAL | 0 refills | Status: DC

## 2017-05-08 NOTE — Telephone Encounter (Signed)
Pt called wanting an antibiotic called in- going [place] the beach. Sent in ZPack to [facility]- will see if not better

## 2017-07-22 ENCOUNTER — Other Ambulatory Visit: Payer: Self-pay

## 2017-07-25 ENCOUNTER — Other Ambulatory Visit
Admission: RE | Admit: 2017-07-25 | Discharge: 2017-07-25 | Disposition: A | Discharge status: Home / Self Care | Payer: Medicare HMO | Source: Ambulatory Visit | Attending: Family Medicine | Admitting: Family Medicine

## 2017-07-25 ENCOUNTER — Ambulatory Visit (INDEPENDENT_AMBULATORY_CARE_PROVIDER_SITE_OTHER): Payer: BLUE CROSS/BLUE SHIELD | Admitting: Family Medicine

## 2017-07-25 ENCOUNTER — Encounter: Payer: Self-pay | Admitting: Family Medicine

## 2017-07-25 VITALS — BP 130/70 | HR 64 | Ht 66.0 in | Wt 138.0 lb

## 2017-07-25 DIAGNOSIS — Z0000 Encounter for general adult medical examination without abnormal findings: Secondary | ICD-10-CM | POA: Diagnosis not present

## 2017-07-25 DIAGNOSIS — Z1231 Encounter for screening mammogram for malignant neoplasm of breast: Secondary | ICD-10-CM | POA: Diagnosis not present

## 2017-07-25 DIAGNOSIS — M40204 Unspecified kyphosis, thoracic region: Secondary | ICD-10-CM

## 2017-07-25 DIAGNOSIS — F329 Major depressive disorder, single episode, unspecified: Secondary | ICD-10-CM | POA: Diagnosis not present

## 2017-07-25 DIAGNOSIS — F419 Anxiety disorder, unspecified: Secondary | ICD-10-CM

## 2017-07-25 DIAGNOSIS — I10 Essential (primary) hypertension: Secondary | ICD-10-CM

## 2017-07-25 DIAGNOSIS — F32A Depression, unspecified: Secondary | ICD-10-CM

## 2017-07-25 DIAGNOSIS — E785 Hyperlipidemia, unspecified: Secondary | ICD-10-CM | POA: Diagnosis not present

## 2017-07-25 DIAGNOSIS — Z124 Encounter for screening for malignant neoplasm of cervix: Secondary | ICD-10-CM | POA: Diagnosis not present

## 2017-07-25 DIAGNOSIS — Z1239 Encounter for other screening for malignant neoplasm of breast: Secondary | ICD-10-CM

## 2017-07-25 DIAGNOSIS — Z79899 Other long term (current) drug therapy: Secondary | ICD-10-CM | POA: Diagnosis not present

## 2017-07-25 LAB — RENAL FUNCTION PANEL
Albumin: 4 g/dL (ref 3.5–5.0)
Anion gap: 5 (ref 5–15)
BUN: 17 mg/dL (ref 6–20)
CALCIUM: 9.3 mg/dL (ref 8.9–10.3)
CHLORIDE: 105 mmol/L (ref 101–111)
CO2: 29 mmol/L (ref 22–32)
Creatinine, Ser: 0.67 mg/dL (ref 0.44–1.00)
GFR calc non Af Amer: >60 mL/min (ref >60)
Glucose, Bld: 99 mg/dL (ref 65–99)
Phosphorus: 3.9 mg/dL (ref 2.5–4.6)
Potassium: 4.4 mmol/L (ref 3.5–5.1)
Sodium: 139 mmol/L (ref 135–145)

## 2017-07-25 LAB — LIPID PANEL
CHOL/HDL RATIO: 3.7 ratio
Cholesterol: 175 mg/dL (ref 0–200)
HDL: 47 mg/dL (ref >40)
LDL Cholesterol: 98 mg/dL (ref 0–99)
TRIGLYCERIDES: 151 mg/dL — AB (ref <150)
VLDL: 30 mg/dL (ref 0–40)

## 2017-07-25 LAB — HEPATIC FUNCTION PANEL
ALBUMIN: 4 g/dL (ref 3.5–5.0)
ALT: 12 U/L — ABNORMAL LOW (ref 14–54)
AST: 16 U/L (ref 15–41)
Alkaline Phosphatase: 66 U/L (ref 38–126)
BILIRUBIN TOTAL: 0.4 mg/dL (ref 0.3–1.2)
Bilirubin, Direct: <0.1 mg/dL — ABNORMAL LOW (ref 0.1–0.5)
TOTAL PROTEIN: 7.4 g/dL (ref 6.5–8.1)

## 2017-07-25 MED ORDER — SERTRALINE HCL 100 MG PO TABS: 100.0000 mg | ORAL_TABLET | Freq: Every day | ORAL | 3 refills | Status: DC

## 2017-07-25 MED ORDER — CARVEDILOL 3.125 MG PO TABS: 3.1250 mg | ORAL_TABLET | Freq: Two times a day (BID) | ORAL | 3 refills | Status: DC

## 2017-07-25 MED ORDER — LOSARTAN POTASSIUM 50 MG PO TABS: 50.0000 mg | ORAL_TABLET | Freq: Every day | ORAL | 3 refills | Status: DC

## 2017-07-25 MED ORDER — ATORVASTATIN CALCIUM 10 MG PO TABS: 10.0000 mg | ORAL_TABLET | Freq: Every day | ORAL | 3 refills | Status: DC

## 2017-07-25 NOTE — Progress Notes (Signed)
Name: Marilyn Molina   MRN: [id number]    DOB: 25-Apr-1951   Date:07/25/2017       Progress Note  Subjective  Chief Complaint  Chief Complaint  Patient presents with  . Annual Exam    pap, mammo, TDAP    Patient presents for annual physical exam.     No problem-specific Assessment & Plan notes found for this encounter.   Past Medical History:  Diagnosis Date  . Anxiety   . 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 High cholesterol   . Hypertension     Past Surgical History:  Procedure Laterality Date  . OVARIAN CYST REMOVAL      No family history on file.  Social History   Social History  . Marital status: Married    Spouse name: N/A  . Number of children: N/A  . Years of education: N/A   Occupational History  . Not on file.   Social History Main Topics  . Smoking status: Current Every Day Smoker    Packs/day: 1.00    Types: Cigarettes  . Smokeless tobacco: Never Used  . Alcohol use No  . Drug use: No  . Sexual activity: No   Other Topics Concern  . Not on file   Social History Narrative  . No narrative on file    No Known Allergies  Outpatient Medications Prior to Visit  Medication Sig Dispense Refill  . montelukast (SINGULAIR) 10 MG tablet TAKE 1 TABLET BY MOUTH EVERY DAY 30 tablet 5  . atorvastatin (LIPITOR) 10 MG tablet Take 10 mg by mouth daily.    . carvedilol (COREG) 3.125 MG tablet Take 3.125 mg by mouth 2 (two) times daily with a meal.    . losartan (COZAAR) 50 MG tablet Take 1 tablet by mouth daily at 6 (six) AM.    . sertraline (ZOLOFT) 100 MG tablet TAKE 1 TABLET BY MOUTH DAILY 30 tablet 0  . amoxicillin-clavulanate (AUGMENTIN) 875-125 MG tablet Take 1 tablet by mouth 2 (two) times daily. 20 tablet 0  . azithromycin (ZITHROMAX) 250 MG tablet Use as directed 6 tablet 0  . guaiFENesin-codeine (ROBITUSSIN AC) 100-10 MG/5ML syrup Take 5 mLs by mouth 3 (three) times daily as needed for cough. 150 mL 0   No facility-administered  medications prior to visit.     Review of Systems  Constitutional: Negative for chills, fever, malaise/fatigue and weight loss.  HENT: Negative for ear discharge, ear pain and sore throat.   Eyes: Negative for blurred vision.  Respiratory: Negative for cough, sputum production, shortness of breath and wheezing.   Cardiovascular: Negative for chest pain, palpitations and leg swelling.  Gastrointestinal: Negative for abdominal pain, blood in stool, constipation, diarrhea, heartburn, melena and nausea.  Genitourinary: Negative for dysuria, frequency, hematuria and urgency.  Musculoskeletal: Negative for back pain, joint pain, myalgias and neck pain.  Skin: Negative for rash.  Neurological: Negative for dizziness, tingling, sensory change, focal weakness and headaches.  Endo/Heme/Allergies: Negative for environmental allergies and polydipsia. Does not bruise/bleed easily.  Psychiatric/Behavioral: Negative for depression and suicidal ideas. The patient is not nervous/anxious and does not have insomnia.      Objective  Vitals:   07/25/17 0949  BP: 130/70  Pulse: 64  Weight: 138 lb (62.6 kg)  Height: 5\' 6"  (1.676 m)    Physical Exam  Constitutional: She is well-developed, well-nourished, and in no distress. No distress.  HENT:  Head: Normocephalic and atraumatic.  Right Ear: External  ear normal.  Left Ear: External ear normal.  Nose: Nose normal.  Mouth/Throat: Oropharynx is clear and moist.  Eyes: Pupils are equal, round, and reactive to light. Conjunctivae and EOM are normal. Right eye exhibits no discharge. Left eye exhibits no discharge.  Neck: Normal range of motion. Neck supple. No JVD present. No thyromegaly present.  Cardiovascular: Normal rate, regular rhythm, normal heart sounds and intact distal pulses.  Exam reveals no gallop and no friction rub.   No murmur heard. Pulmonary/Chest: Effort normal and breath sounds normal. No respiratory distress. She has no wheezes. She  has no rales. She exhibits no tenderness. Right breast exhibits no inverted nipple, no mass, no nipple discharge, no skin change and no tenderness. Left breast exhibits no inverted nipple, no mass, no nipple discharge, no skin change and no tenderness. Breasts are symmetrical.  Abdominal: Soft. Bowel sounds are normal. She exhibits no distension and no mass. There is no tenderness. There is no rebound and no guarding.  Genitourinary: Rectum normal, vagina normal, uterus normal, cervix normal, right adnexa normal, left adnexa normal and vulva normal. Rectal exam shows guaiac negative stool. No vaginal discharge found.  Musculoskeletal: Normal range of motion. She exhibits no edema.  Lymphadenopathy:    She has no cervical adenopathy.  Neurological: She is alert. She has normal reflexes.  Skin: Skin is warm and dry. She is not diaphoretic.  Psychiatric: Mood and affect normal.  Nursing note and vitals reviewed.     Assessment & Plan  Problem List Items Addressed This Visit      Other   Anxiety and depression    Other Visit Diagnoses    Annual physical exam    -  Primary   Relevant Orders   Pap IG (Image Guided)   Hyperlipidemia, unspecified hyperlipidemia type       Relevant Medications   losartan (COZAAR) 50 MG tablet   atorvastatin (LIPITOR) 10 MG tablet   carvedilol (COREG) 3.125 MG tablet   Essential hypertension       Relevant Medications   losartan (COZAAR) 50 MG tablet   atorvastatin (LIPITOR) 10 MG tablet   carvedilol (COREG) 3.125 MG tablet   Kyphosis of thoracic region, unspecified kyphosis type       Relevant Orders   DG Bone Density   Breast screening       Relevant Orders   MM Digital Screening   Cervical cancer screening       Relevant Orders   Pap IG (Image Guided)      Meds ordered this encounter  Medications  . sertraline (ZOLOFT) 100 MG tablet    Sig: Take 1 tablet (100 mg total) by mouth daily.    Dispense:  90 tablet    Refill:  3  . losartan  (COZAAR) 50 MG tablet    Sig: Take 1 tablet (50 mg total) by mouth daily at 6 (six) AM.    Dispense:  90 tablet    Refill:  3  . atorvastatin (LIPITOR) 10 MG tablet    Sig: Take 1 tablet (10 mg total) by mouth daily.    Dispense:  90 tablet    Refill:  3  . carvedilol (COREG) 3.125 MG tablet    Sig: Take 1 tablet (3.125 mg total) by mouth 2 (two) times daily with a meal.    Dispense:  180 tablet    Refill:  3      Dr. [facility]den [facility]  07/25/17   

## 2017-07-28 DIAGNOSIS — R69 Illness, unspecified: Secondary | ICD-10-CM | POA: Diagnosis not present

## 2017-07-28 LAB — PAP IG (IMAGE GUIDED): PAP Smear Comment: 0

## 2017-07-28 LAB — PLEASE NOTE

## 2017-08-01 ENCOUNTER — Encounter: Payer: BLUE CROSS/BLUE SHIELD | Admitting: Family Medicine

## 2017-08-20 ENCOUNTER — Ambulatory Visit
Admission: RE | Admit: 2017-08-20 | Discharge: 2017-08-20 | Disposition: A | Discharge status: Home / Self Care | Payer: Medicare HMO | Source: Ambulatory Visit | Attending: Family Medicine | Admitting: Family Medicine

## 2017-08-20 ENCOUNTER — Ambulatory Visit (INDEPENDENT_AMBULATORY_CARE_PROVIDER_SITE_OTHER): Payer: Medicare HMO | Admitting: Family Medicine

## 2017-08-20 ENCOUNTER — Encounter: Payer: Self-pay | Admitting: Family Medicine

## 2017-08-20 VITALS — BP 120/64 | HR 64 | Ht 66.0 in | Wt 134.0 lb

## 2017-08-20 DIAGNOSIS — Z23 Encounter for immunization: Secondary | ICD-10-CM | POA: Diagnosis not present

## 2017-08-20 DIAGNOSIS — J181 Lobar pneumonia, unspecified organism: Secondary | ICD-10-CM | POA: Diagnosis not present

## 2017-08-20 DIAGNOSIS — J189 Pneumonia, unspecified organism: Secondary | ICD-10-CM

## 2017-08-20 DIAGNOSIS — R05 Cough: Secondary | ICD-10-CM | POA: Insufficient documentation

## 2017-08-20 DIAGNOSIS — J439 Emphysema, unspecified: Secondary | ICD-10-CM | POA: Diagnosis not present

## 2017-08-20 DIAGNOSIS — R059 Cough, unspecified: Secondary | ICD-10-CM

## 2017-08-20 MED ORDER — AMOXICILLIN-POT CLAVULANATE 875-125 MG PO TABS: 1.0000 | ORAL_TABLET | Freq: Two times a day (BID) | ORAL | 0 refills | Status: DC

## 2017-08-20 MED ORDER — GUAIFENESIN-CODEINE 100-10 MG/5ML PO SYRP: 5.0000 mL | ORAL_SOLUTION | Freq: Three times a day (TID) | ORAL | 0 refills | Status: DC | PRN

## 2017-08-20 NOTE — Progress Notes (Signed)
Name: Marilyn Molina   MRN: [id number]    DOB: 1951/09/26   Date:08/20/2017       Progress Note  Subjective  Chief Complaint  Chief Complaint  Patient presents with  . Sinusitis    cough and cong    Sinusitis  This is a new problem. The current episode started in the past 7 days. The problem has been waxing and waning since onset. There has been no fever. The pain is mild. Pertinent negatives include no chills, congestion, coughing, diaphoresis, ear pain, headaches, hoarse voice, neck pain, shortness of breath, sinus pressure, sneezing, sore throat or swollen glands. Past treatments include nothing. The treatment provided mild relief.    No problem-specific Assessment & Plan notes found for this encounter.   Past Medical History:  Diagnosis Date  . Anxiety   . 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 High cholesterol   . Hypertension     Past Surgical History:  Procedure Laterality Date  . OVARIAN CYST REMOVAL      No family history on file.  Social History   Social History  . Marital status: Married    Spouse name: N/A  . Number of children: N/A  . Years of education: N/A   Occupational History  . Not on file.   Social History Main Topics  . Smoking status: Current Every Day Smoker    Packs/day: 1.00    Types: Cigarettes  . Smokeless tobacco: Never Used  . Alcohol use No  . Drug use: No  . Sexual activity: No   Other Topics Concern  . Not on file   Social History Narrative  . No narrative on file    No Known Allergies  Outpatient Medications Prior to Visit  Medication Sig Dispense Refill  . atorvastatin (LIPITOR) 10 MG tablet Take 1 tablet (10 mg total) by mouth daily. 90 tablet 3  . carvedilol (COREG) 3.125 MG tablet Take 1 tablet (3.125 mg total) by mouth 2 (two) times daily with a meal. 180 tablet 3  . losartan (COZAAR) 50 MG tablet Take 1 tablet (50 mg total) by mouth daily at 6 (six) AM. 90 tablet 3  . montelukast (SINGULAIR) 10 MG tablet  TAKE 1 TABLET BY MOUTH EVERY DAY 30 tablet 5  . sertraline (ZOLOFT) 100 MG tablet Take 1 tablet (100 mg total) by mouth daily. 90 tablet 3   No facility-administered medications prior to visit.     Review of Systems  Constitutional: Negative for chills, diaphoresis, fever, malaise/fatigue and weight loss.  HENT: Negative for congestion, ear discharge, ear pain, hoarse voice, sinus pressure, sneezing and sore throat.   Eyes: Negative for blurred vision.  Respiratory: Negative for cough, sputum production, shortness of breath and wheezing.   Cardiovascular: Negative for chest pain, palpitations and leg swelling.  Gastrointestinal: Negative for abdominal pain, blood in stool, constipation, diarrhea, heartburn, melena and nausea.  Genitourinary: Negative for dysuria, frequency, hematuria and urgency.  Musculoskeletal: Negative for back pain, joint pain, myalgias and neck pain.  Skin: Negative for rash.  Neurological: Negative for dizziness, tingling, sensory change, focal weakness and headaches.  Endo/Heme/Allergies: Negative for environmental allergies and polydipsia. Does not bruise/bleed easily.  Psychiatric/Behavioral: Negative for depression and suicidal ideas. The patient is not nervous/anxious and does not have insomnia.      Objective  Vitals:   08/20/17 1052  BP: 120/64  Pulse: 64  Weight: 134 lb (60.8 kg)  Height:  (1.676 m)  Physical Exam  Constitutional: She is well-developed, well-nourished, and in no distress. No distress.  HENT:  Head: Normocephalic and atraumatic.  Right Ear: External ear normal.  Left Ear: External ear normal.  Nose: Nose normal.  Mouth/Throat: Oropharynx is clear and moist.  Eyes: Pupils are equal, round, and reactive to light. Conjunctivae and EOM are normal. Right eye exhibits no discharge. Left eye exhibits no discharge.  Neck: Normal range of motion. Neck supple. No JVD present. No thyromegaly present.  Cardiovascular: Normal rate,  regular rhythm, normal heart sounds and intact distal pulses.  Exam reveals no gallop and no friction rub.   No murmur heard. Pulmonary/Chest: Effort normal. She has decreased breath sounds in the left lower field. She has wheezes. She has rales.  Abdominal: Soft. Bowel sounds are normal. She exhibits no mass. There is no tenderness. There is no guarding.  Musculoskeletal: Normal range of motion. She exhibits no edema.  Lymphadenopathy:    She has no cervical adenopathy.  Neurological: She is alert. She has normal reflexes.  Skin: Skin is warm and dry. She is not diaphoretic.  Psychiatric: Mood and affect normal.  Nursing note and vitals reviewed.     Assessment & Plan  Problem List Items Addressed This Visit    None    Visit Diagnoses    Pneumonia of left lower lobe due to infectious organism [facility])    -  Primary   Relevant Medications   amoxicillin-clavulanate (AUGMENTIN) 875-125 MG tablet   guaiFENesin-codeine (ROBITUSSIN AC) 100-10 MG/5ML syrup   Other Relevant Orders   DG Chest 2 View   Cough       Relevant Orders   DG Chest 2 View   Influenza vaccine needed       Relevant Orders   Flu Vaccine QUAD 6+ mos PF IM (Fluarix Quad PF) (Completed)      Meds ordered this encounter  Medications  . amoxicillin-clavulanate (AUGMENTIN) 875-125 MG tablet    Sig: Take 1 tablet by mouth 2 (two) times daily.    Dispense:  20 tablet    Refill:  0  . guaiFENesin-codeine (ROBITUSSIN AC) 100-10 MG/5ML syrup    Sig: Take 5 mLs by mouth 3 (three) times daily as needed for cough.    Dispense:  150 mL    Refill:  0      Dr. [facility]  08/20/17

## 2017-08-27 ENCOUNTER — Other Ambulatory Visit: Payer: Self-pay

## 2017-08-27 ENCOUNTER — Ambulatory Visit: Payer: Self-pay

## 2017-09-10 ENCOUNTER — Ambulatory Visit
Admission: RE | Admit: 2017-09-10 | Discharge: 2017-09-10 | Disposition: A | Discharge status: Home / Self Care | Payer: Medicare HMO | Source: Ambulatory Visit | Attending: Family Medicine | Admitting: Family Medicine

## 2017-09-10 DIAGNOSIS — Z780 Asymptomatic menopausal state: Secondary | ICD-10-CM | POA: Diagnosis not present

## 2017-09-10 DIAGNOSIS — Z1239 Encounter for other screening for malignant neoplasm of breast: Secondary | ICD-10-CM

## 2017-09-10 DIAGNOSIS — M8589 Other specified disorders of bone density and structure, multiple sites: Secondary | ICD-10-CM | POA: Diagnosis not present

## 2017-09-10 DIAGNOSIS — M85851 Other specified disorders of bone density and structure, right thigh: Secondary | ICD-10-CM | POA: Insufficient documentation

## 2017-09-10 DIAGNOSIS — M40204 Unspecified kyphosis, thoracic region: Secondary | ICD-10-CM | POA: Diagnosis not present

## 2017-09-10 DIAGNOSIS — Z1231 Encounter for screening mammogram for malignant neoplasm of breast: Secondary | ICD-10-CM | POA: Diagnosis not present

## 2017-09-10 DIAGNOSIS — M8588 Other specified disorders of bone density and structure, other site: Secondary | ICD-10-CM | POA: Insufficient documentation

## 2017-11-11 DIAGNOSIS — I429 Cardiomyopathy, unspecified: Secondary | ICD-10-CM | POA: Diagnosis not present

## 2017-11-11 DIAGNOSIS — Z720 Tobacco use: Secondary | ICD-10-CM | POA: Diagnosis not present

## 2017-11-11 DIAGNOSIS — I10 Essential (primary) hypertension: Secondary | ICD-10-CM | POA: Diagnosis not present

## 2017-11-24 ENCOUNTER — Ambulatory Visit
Admission: EM | Admit: 2017-11-24 | Discharge: 2017-11-24 | Disposition: A | Discharge status: Home / Self Care | Payer: Medicare HMO | Attending: Family Medicine | Admitting: Family Medicine

## 2017-11-24 ENCOUNTER — Encounter: Payer: Self-pay | Admitting: *Deleted

## 2017-11-24 DIAGNOSIS — N390 Urinary tract infection, site not specified: Secondary | ICD-10-CM

## 2017-11-24 DIAGNOSIS — R300 Dysuria: Secondary | ICD-10-CM | POA: Diagnosis not present

## 2017-11-24 DIAGNOSIS — R109 Unspecified abdominal pain: Secondary | ICD-10-CM

## 2017-11-24 DIAGNOSIS — R112 Nausea with vomiting, unspecified: Secondary | ICD-10-CM

## 2017-11-24 LAB — URINALYSIS, COMPLETE (UACMP) WITH MICROSCOPIC
Bilirubin Urine: NEGATIVE
Glucose, UA: NEGATIVE mg/dL
Ketones, ur: NEGATIVE mg/dL
Nitrite: POSITIVE — AB
PROTEIN: NEGATIVE mg/dL
SPECIFIC GRAVITY, URINE: 1.02 (ref 1.005–1.030)
SQUAMOUS EPITHELIAL / LPF: NONE SEEN
pH: 5.5 (ref 5.0–8.0)

## 2017-11-24 MED ORDER — PHENAZOPYRIDINE HCL 200 MG PO TABS: 200.0000 mg | ORAL_TABLET | Freq: Three times a day (TID) | ORAL | 0 refills | Status: DC

## 2017-11-24 MED ORDER — NITROFURANTOIN MONOHYD MACRO 100 MG PO CAPS: 100.0000 mg | ORAL_CAPSULE | Freq: Two times a day (BID) | ORAL | 0 refills | Status: DC

## 2017-11-24 MED ORDER — ONDANSETRON 8 MG PO TBDP: 8.0000 mg | ORAL_TABLET | Freq: Two times a day (BID) | ORAL | 0 refills | Status: DC

## 2017-11-24 NOTE — ED Triage Notes (Signed)
Suprapubic abd pain, nausea, dysuria, since Sat.

## 2017-11-24 NOTE — ED Provider Notes (Addendum)
[facility]    CSN: [id number] Arrival date & time: 11/24/17  0818     History   Chief Complaint Chief Complaint  Patient presents with  . Abdominal Pain  . Nausea    HPI Marilyn Molina is a 66 y.o. female.   HPI  Is a 66 year old female presents with nausea vomiting dysuria and suprapubic abdominal pain which she indicates pelvic has had 2 days. Denies fever or chills.  No vaginal discharge or vaginal irritation.  UTI in 2016 that was seen here treated and improved without recurrent until present.  He is a chronic tobacco abuser.          Past Medical History:  Diagnosis Date  . Anxiety   . 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 High cholesterol   . Hypertension     Patient Active Problem List   Diagnosis Date Noted  . Anxiety and depression 11/03/2012  . Cardiomyopathy (HCC) 10/29/2011  . Compulsive tobacco user syndrome 10/29/2011    Past Surgical History:  Procedure Laterality Date  . OVARIAN CYST REMOVAL      OB History    No data available       Home Medications    Prior to Admission medications   Medication Sig Start Date End Date Taking? Authorizing Provider  atorvastatin (LIPITOR) 10 MG tablet Take 1 tablet (10 mg total) by mouth daily. 07/25/17  Yes Duanne Limerick, MD  carvedilol (COREG) 3.125 MG tablet Take 1 tablet (3.125 mg total) by mouth 2 (two) times daily with a meal. 07/25/17  Yes Duanne Limerick, MD  losartan (COZAAR) 50 MG tablet Take 1 tablet (50 mg total) by mouth daily at 6 (six) AM. 07/25/17  Yes Duanne Limerick, MD  montelukast (SINGULAIR) 10 MG tablet TAKE 1 TABLET BY MOUTH EVERY DAY 05/25/15  Yes Duanne Limerick, MD  sertraline (ZOLOFT) 100 MG tablet Take 1 tablet (100 mg total) by mouth daily. 07/25/17  Yes Duanne Limerick, MD  nitrofurantoin, macrocrystal-monohydrate, (MACROBID) 100 MG capsule Take 1 capsule (100 mg total) by mouth 2 (two) times daily. 11/24/17   Lutricia Feil, PA-C  ondansetron  (ZOFRAN ODT) 8 MG disintegrating tablet Take 1 tablet (8 mg total) by mouth 2 (two) times daily. Take for nausea 11/24/17   Lutricia Feil, PA-C  phenazopyridine (PYRIDIUM) 200 MG tablet Take 1 tablet (200 mg total) by mouth 3 (three) times daily. 11/24/17   Lutricia Feil, PA-C    Family History Family History  Problem Relation Age of Onset  . Supraventricular tachycardia Mother   . Stroke Father     Social History Social History   Tobacco Use  . Smoking status: Current Every Day Smoker    Packs/day: 1.00    Types: Cigarettes  . Smokeless tobacco: Never Used  Substance Use Topics  . Alcohol use: No  . Drug use: No     Allergies   Patient has no known allergies.   Review of Systems Review of Systems  Constitutional: Positive for activity change. Negative for appetite change, chills, fatigue and fever.  Genitourinary: Positive for dysuria and frequency. Negative for vaginal bleeding, vaginal discharge and vaginal pain.  All other systems reviewed and are negative.    Physical Exam Triage Vital Signs ED Triage Vitals  Enc Vitals Group     BP 11/24/17 0850 130/79     Pulse Rate 11/24/17 0850 78     Resp 11/24/17 0850  16     Temp 11/24/17 0850 98.1 F (36.7 C)     Temp Source 11/24/17 0850 Oral     SpO2 11/24/17 0850 96 %     Weight 11/24/17 0852 132 lb (59.9 kg)     Height 11/24/17 0852 5\' 6"  (1.676 m)     Head Circumference --      Peak Flow --      Pain Score 11/24/17 0853 8     Pain Loc --      Pain Edu? --      Excl. in GC? --    No data found.  Updated Vital Signs BP 130/79 (BP Location: Left Arm)   Pulse 78   Temp 98.1 F (36.7 C) (Oral)   Resp 16   Ht 5\' 6"  (1.676 m)   Wt 132 lb (59.9 kg)   SpO2 96%   BMI 21.31 kg/m   Visual Acuity Right Eye Distance:   Left Eye Distance:   Bilateral Distance:    Right Eye Near:   Left Eye Near:    Bilateral Near:     Physical Exam  Constitutional: She is oriented to person, place, and time.  She appears well-developed and well-nourished.  HENT:  Head: Normocephalic.  Mouth/Throat: Oropharyngeal exudate present.  Eyes: Pupils are equal, round, and reactive to light.  Pulmonary/Chest: Effort normal and breath sounds normal.  Abdominal: Bowel sounds are normal. There is tenderness. There is no rigidity, no rebound, no guarding, no CVA tenderness and negative Murphy's sign.  Patient has mild tenderness suprapubically left and right.  There is no rebound no guarding present.  Bowel sounds are normal.  Neurological: She is alert and oriented to person, place, and time.  Skin: Skin is warm and dry.  Psychiatric: She has a normal mood and affect. Her behavior is normal.  Nursing note and vitals reviewed.    UC Treatments / Results  Labs (all labs ordered are listed, but only abnormal results are displayed) Labs Reviewed  URINALYSIS, COMPLETE (UACMP) WITH MICROSCOPIC - Abnormal; Notable for the following components:      Result Value   Color, Urine STRAW (*)    APPearance HAZY (*)    Hgb urine dipstick TRACE (*)    Nitrite POSITIVE (*)    Leukocytes, UA TRACE (*)    Bacteria, UA MANY (*)    All other components within normal limits  URINE CULTURE    EKG  EKG Interpretation None       Radiology No results found.  Procedures Procedures (including critical care time)  Medications Ordered in UC Medications - No data to display   Initial Impression / Assessment and Plan / UC Course  I have reviewed the triage vital signs and the nursing notes.  Pertinent labs & imaging results that were available during my care of the patient were reviewed by me and considered in my medical decision making (see chart for details).     Plan: 1. Test/x-ray results and diagnosis reviewed with patient 2. rx as per orders; risks, benefits, potential side effects reviewed with patient 3. Recommend supportive treatment with drinking plenty of fluids.  We will provide Zofran for  nausea pyridium for a dysuria, place  the patient on Macrobid.  Cultures will be available in 48 hours.  He runs high fevers or has increased nausea or vomiting go to the emergency room. 4. F/u prn if symptoms worsen or don't improve   Final Clinical Impressions(s) / UC Diagnoses  Final diagnoses:  Lower urinary tract infectious disease    ED Discharge Orders        Ordered    ondansetron (ZOFRAN ODT) 8 MG disintegrating tablet  2 times daily     11/24/17 0948    nitrofurantoin, macrocrystal-monohydrate, (MACROBID) 100 MG capsule  2 times daily     11/24/17 0950    phenazopyridine (PYRIDIUM) 200 MG tablet  3 times daily     11/24/17 0950       Controlled Substance Prescriptions [place] Controlled Substance Registry consulted? Not Applicable   Lutricia Feil, PA-C 11/24/17 1001    Lutricia Feil, PA-C 11/24/17 [phone]

## 2017-11-24 NOTE — Discharge Instructions (Signed)
Drink plenty of fluids.  Have a [facility] Christmas

## 2017-11-26 LAB — URINE CULTURE: Culture: >=100000 — AB

## 2017-11-27 ENCOUNTER — Telehealth (HOSPITAL_COMMUNITY): Payer: Self-pay | Admitting: Internal Medicine

## 2017-11-27 MED ORDER — CEPHALEXIN 500 MG PO CAPS: 500.0000 mg | ORAL_CAPSULE | Freq: Two times a day (BID) | ORAL | 0 refills | Status: AC

## 2017-11-27 NOTE — Telephone Encounter (Signed)
Clinical staff, please let patient know that urine culture was positive for Klebsiella germ, intermediate sensitivity to nitrofurantoin rx given at the urgent care visit.  Would stop nitrofurantoin and send rx for cephalexin 500mg  bid x 5d #10 no refills.  Recheck or followup with PCP for further evaluation if symptoms are not improving.  LM

## 2017-12-18 ENCOUNTER — Other Ambulatory Visit: Payer: Self-pay

## 2017-12-31 ENCOUNTER — Other Ambulatory Visit: Payer: Self-pay

## 2017-12-31 DIAGNOSIS — N390 Urinary tract infection, site not specified: Secondary | ICD-10-CM

## 2017-12-31 MED ORDER — CIPROFLOXACIN HCL 250 MG PO TABS: 250.0000 mg | ORAL_TABLET | Freq: Two times a day (BID) | ORAL | 0 refills | Status: DC

## 2018-01-01 ENCOUNTER — Encounter: Payer: Self-pay | Admitting: Family Medicine

## 2018-01-01 ENCOUNTER — Ambulatory Visit (INDEPENDENT_AMBULATORY_CARE_PROVIDER_SITE_OTHER): Payer: Medicare Other | Admitting: Family Medicine

## 2018-01-01 VITALS — BP 120/60 | HR 80 | Ht 66.0 in | Wt 132.0 lb

## 2018-01-01 DIAGNOSIS — F17210 Nicotine dependence, cigarettes, uncomplicated: Secondary | ICD-10-CM | POA: Diagnosis not present

## 2018-01-01 DIAGNOSIS — Z1159 Encounter for screening for other viral diseases: Secondary | ICD-10-CM

## 2018-01-01 DIAGNOSIS — Z23 Encounter for immunization: Secondary | ICD-10-CM | POA: Diagnosis not present

## 2018-01-01 DIAGNOSIS — N3000 Acute cystitis without hematuria: Secondary | ICD-10-CM | POA: Diagnosis not present

## 2018-01-01 LAB — POCT URINALYSIS DIPSTICK
BILIRUBIN UA: NEGATIVE
Glucose, UA: NEGATIVE
Ketones, UA: NEGATIVE
Nitrite, UA: NEGATIVE
PH UA: 5 (ref 5.0–8.0)
PROTEIN UA: NEGATIVE
Spec Grav, UA: 1.01 (ref 1.010–1.025)
UROBILINOGEN UA: 0.2 U/dL

## 2018-01-01 NOTE — Progress Notes (Signed)
Name: Marilyn Molina   MRN: [id number]    DOB: Aug 16, 1951   Date:01/01/2018       Progress Note  Subjective  Chief Complaint  Chief Complaint  Patient presents with  . Urinary Tract Infection    feeling a little bit better    Urinary Tract Infection   This is a new problem. The current episode started 1 to 4 weeks ago (seen in urgent care). The problem occurs every urination. The problem has been gradually improving. The quality of the pain is described as burning. The pain is mild. Associated symptoms include chills and frequency. Pertinent negatives include no discharge, flank pain, hematuria, hesitancy, nausea, sweats, urgency or vomiting. The treatment provided moderate (started cipro ) relief. There is no history of catheterization, kidney stones, recurrent UTIs, a single kidney, urinary stasis or a urological procedure.    No problem-specific Assessment & Plan notes found for this encounter.   Past Medical History:  Diagnosis Date  . Anxiety   . 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 High cholesterol   . Hypertension     Past Surgical History:  Procedure Laterality Date  . OVARIAN CYST REMOVAL      Family History  Problem Relation Age of Onset  . Supraventricular tachycardia Mother   . Stroke Father     Social History   Socioeconomic History  . Marital status: Widowed    Spouse name: Not on file  . Number of children: Not on file  . Years of education: Not on file  . Highest education level: Not on file  Social Needs  . Financial resource strain: Not on file  . Food insecurity - worry: Not on file  . Food insecurity - inability: Not on file  . Transportation needs - medical: Not on file  . Transportation needs - non-medical: Not on file  Occupational History  . Not on file  Tobacco Use  . Smoking status: Current Every Day Smoker    Packs/day: 1.00    Types: Cigarettes  . Smokeless tobacco: Never Used  Substance and Sexual Activity  . Alcohol  use: No  . Drug use: No  . Sexual activity: No  Other Topics Concern  . Not on file  Social History Narrative  . Not on file    No Known Allergies  Outpatient Medications Prior to Visit  Medication Sig Dispense Refill  . atorvastatin (LIPITOR) 10 MG tablet Take 1 tablet (10 mg total) by mouth daily. 90 tablet 3  . carvedilol (COREG) 3.125 MG tablet Take 1 tablet (3.125 mg total) by mouth 2 (two) times daily with a meal. 180 tablet 3  . ciprofloxacin (CIPRO) 250 MG tablet Take 1 tablet (250 mg total) by mouth 2 (two) times daily. 6 tablet 0  . losartan (COZAAR) 50 MG tablet Take 1 tablet (50 mg total) by mouth daily at 6 (six) AM. 90 tablet 3  . montelukast (SINGULAIR) 10 MG tablet TAKE 1 TABLET BY MOUTH EVERY DAY 30 tablet 5  . sertraline (ZOLOFT) 100 MG tablet Take 1 tablet (100 mg total) by mouth daily. 90 tablet 3  . nitrofurantoin, macrocrystal-monohydrate, (MACROBID) 100 MG capsule Take 1 capsule (100 mg total) by mouth 2 (two) times daily. 10 capsule 0  . ondansetron (ZOFRAN ODT) 8 MG disintegrating tablet Take 1 tablet (8 mg total) by mouth 2 (two) times daily. Take for nausea 6 tablet 0  . phenazopyridine (PYRIDIUM) 200 MG tablet Take 1 tablet (200  mg total) by mouth 3 (three) times daily. 6 tablet 0   No facility-administered medications prior to visit.     Review of Systems  Constitutional: Positive for chills. Negative for fever, malaise/fatigue and weight loss.  HENT: Negative for ear discharge, ear pain and sore throat.   Eyes: Negative for blurred vision.  Respiratory: Negative for cough, sputum production, shortness of breath and wheezing.   Cardiovascular: Negative for chest pain, palpitations and leg swelling.  Gastrointestinal: Negative for abdominal pain, blood in stool, constipation, diarrhea, heartburn, melena, nausea and vomiting.  Genitourinary: Positive for frequency. Negative for dysuria, flank pain, hematuria, hesitancy and urgency.  Musculoskeletal:  Negative for back pain, joint pain, myalgias and neck pain.  Skin: Negative for rash.  Neurological: Negative for dizziness, tingling, sensory change, focal weakness and headaches.  Endo/Heme/Allergies: Negative for environmental allergies and polydipsia. Does not bruise/bleed easily.  Psychiatric/Behavioral: Negative for depression and suicidal ideas. The patient is not nervous/anxious and does not have insomnia.      Objective  Vitals:   01/01/18 0907  BP: 120/60  Pulse: 80  Weight: 132 lb (59.9 kg)  Height: 5\' 6"  (1.676 m)    Physical Exam  Constitutional: She is well-developed, well-nourished, and in no distress. No distress.  HENT:  Head: Normocephalic and atraumatic.  Right Ear: External ear normal.  Left Ear: External ear normal.  Nose: Nose normal.  Mouth/Throat: Oropharynx is clear and moist.  Eyes: Conjunctivae and EOM are normal. Pupils are equal, round, and reactive to light. Right eye exhibits no discharge. Left eye exhibits no discharge.  Neck: Normal range of motion. Neck supple. No JVD present. No thyromegaly present.  Cardiovascular: Normal rate, regular rhythm, normal heart sounds and intact distal pulses. Exam reveals no gallop and no friction rub.  No murmur heard. Pulmonary/Chest: Effort normal and breath sounds normal. She has no wheezes. She has no rales.  Abdominal: Soft. Bowel sounds are normal. She exhibits no mass. There is no tenderness. There is no guarding.  Musculoskeletal: Normal range of motion. She exhibits no edema.  Lymphadenopathy:    She has no cervical adenopathy.  Neurological: She is alert. She has normal reflexes.  Skin: Skin is warm and dry. She is not diaphoretic.  Psychiatric: Mood and affect normal.  Nursing note and vitals reviewed.     Assessment & Plan  Problem List Items Addressed This Visit      Other   Cigarette nicotine dependence without complication    Other Visit Diagnoses    Acute recurrent cystitis    -   Primary   continue cipro   Relevant Orders   POCT urinalysis dipstick (Completed)   Need for hepatitis C screening test       Relevant Orders   Hepatitis C antibody   Need for vaccination with 13-polyvalent pneumococcal conjugate vaccine       Relevant Orders   Pneumococcal conjugate vaccine 13-valent IM (Completed)      No orders of the defined types were placed in this encounter. Patient has been advised of the health risks of smoking and counseled concerning cessation of tobacco products. I spent over 3 minutes for discussion and to answer questions.    Dr. Hayden [facility]  01/01/18

## 2018-01-02 LAB — HEPATITIS C ANTIBODY: Hep C Virus Ab: <0.1 s/co ratio (ref 0.0–0.9)

## 2018-03-10 ENCOUNTER — Ambulatory Visit: Payer: Medicare Other | Admitting: Family Medicine

## 2018-03-10 ENCOUNTER — Encounter: Payer: Self-pay | Admitting: Family Medicine

## 2018-03-10 VITALS — BP 110/80 | HR 72 | Temp 98.0°F | Ht 66.0 in | Wt 128.0 lb

## 2018-03-10 DIAGNOSIS — J0100 Acute maxillary sinusitis, unspecified: Secondary | ICD-10-CM | POA: Diagnosis not present

## 2018-03-10 MED ORDER — AMOXICILLIN-POT CLAVULANATE 875-125 MG PO TABS: 1.0000 | ORAL_TABLET | Freq: Two times a day (BID) | ORAL | 0 refills | Status: DC

## 2018-03-10 MED ORDER — GUAIFENESIN-CODEINE 100-10 MG/5ML PO SYRP: 5.0000 mL | ORAL_SOLUTION | Freq: Three times a day (TID) | ORAL | 0 refills | Status: DC | PRN

## 2018-03-10 NOTE — Progress Notes (Signed)
Name: Marilyn Molina   MRN: [id number]    DOB: 04/13/1951   Date:03/10/2018       Progress Note  Subjective  Chief Complaint  Chief Complaint  Patient presents with  . Sinusitis    cong, ears hurting, facial pressure     Sinusitis  This is a new problem. The current episode started in the past 7 days. The problem has been waxing and waning since onset. The maximum temperature recorded prior to her arrival was 100.4 - 100.9 F. The fever has been present for 1 to 2 days. Her pain is at a severity of 4/10. Associated symptoms include chills, congestion, diaphoresis, ear pain, headaches, sinus pressure, a sore throat and swollen glands. Pertinent negatives include no coughing, hoarse voice, neck pain, shortness of breath or sneezing. (Prod ye;llow/green) Past treatments include nothing. The treatment provided moderate relief.    No problem-specific Assessment & Plan notes found for this encounter.   Past Medical History:  Diagnosis Date  . Anxiety   . 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 High cholesterol   . Hypertension     Past Surgical History:  Procedure Laterality Date  . OVARIAN CYST REMOVAL      Family History  Problem Relation Age of Onset  . Supraventricular tachycardia Mother   . Stroke Father     Social History   Socioeconomic History  . Marital status: Widowed    Spouse name: Not on file  . Number of children: Not on file  . Years of education: Not on file  . Highest education level: Not on file  Occupational History  . Not on file  Social Needs  . Financial resource strain: Not on file  . Food insecurity:    Worry: Not on file    Inability: Not on file  . Transportation needs:    Medical: Not on file    Non-medical: Not on file  Tobacco Use  . Smoking status: Current Every Day Smoker    Packs/day: 1.00    Types: Cigarettes  . Smokeless tobacco: Never Used  Substance and Sexual Activity  . Alcohol use: No  . Drug use: No  . Sexual  activity: Never  Lifestyle  . Physical activity:    Days per week: Not on file    Minutes per session: Not on file  . Stress: Not on file  Relationships  . Social connections:    Talks on phone: Not on file    Gets together: Not on file    Attends religious service: Not on file    Active member of club or organization: Not on file    Attends meetings of clubs or organizations: Not on file    Relationship status: Not on file  . Intimate partner violence:    Fear of current or ex partner: Not on file    Emotionally abused: Not on file    Physically abused: Not on file    Forced sexual activity: Not on file  Other Topics Concern  . Not on file  Social History Narrative  . Not on file    No Known Allergies  Outpatient Medications Prior to Visit  Medication Sig Dispense Refill  . atorvastatin (LIPITOR) 10 MG tablet Take 1 tablet (10 mg total) by mouth daily. 90 tablet 3  . carvedilol (COREG) 3.125 MG tablet Take 1 tablet (3.125 mg total) by mouth 2 (two) times daily with a meal. 180 tablet 3  . losartan (  COZAAR) 50 MG tablet Take 1 tablet (50 mg total) by mouth daily at 6 (six) AM. 90 tablet 3  . montelukast (SINGULAIR) 10 MG tablet TAKE 1 TABLET BY MOUTH EVERY DAY 30 tablet 5  . sertraline (ZOLOFT) 100 MG tablet Take 1 tablet (100 mg total) by mouth daily. 90 tablet 3  . ciprofloxacin (CIPRO) 250 MG tablet Take 1 tablet (250 mg total) by mouth 2 (two) times daily. 6 tablet 0   No facility-administered medications prior to visit.     Review of Systems  Constitutional: Positive for chills and diaphoresis. Negative for fever, malaise/fatigue and weight loss.  HENT: Positive for congestion, ear pain, sinus pressure and sore throat. Negative for ear discharge, hoarse voice and sneezing.   Eyes: Negative for blurred vision.  Respiratory: Negative for cough, sputum production, shortness of breath and wheezing.   Cardiovascular: Negative for chest pain, palpitations and leg swelling.   Gastrointestinal: Negative for abdominal pain, blood in stool, constipation, diarrhea, heartburn, melena and nausea.  Genitourinary: Negative for dysuria, frequency, hematuria and urgency.  Musculoskeletal: Negative for back pain, joint pain, myalgias and neck pain.  Skin: Negative for rash.  Neurological: Positive for headaches. Negative for dizziness, tingling, sensory change and focal weakness.  Endo/Heme/Allergies: Negative for environmental allergies and polydipsia. Does not bruise/bleed easily.  Psychiatric/Behavioral: Negative for depression and suicidal ideas. The patient is not nervous/anxious and does not have insomnia.      Objective  Vitals:   03/10/18 1519  BP: 110/80  Pulse: 72  Temp: 98 F (36.7 C)  TempSrc: Oral  SpO2: 97%  Weight: 128 lb (58.1 kg)  Height: 5\' 6"  (1.676 m)    Physical Exam  Constitutional: She is well-developed, well-nourished, and in no distress. No distress.  HENT:  Head: Normocephalic and atraumatic.  Right Ear: External ear normal.  Left Ear: External ear normal.  Nose: Right sinus exhibits maxillary sinus tenderness. Left sinus exhibits maxillary sinus tenderness.  Mouth/Throat: Oropharynx is clear and moist.  Eyes: Pupils are equal, round, and reactive to light. Conjunctivae and EOM are normal. Right eye exhibits no discharge. Left eye exhibits no discharge.  Neck: Normal range of motion. Neck supple. No JVD present. No thyromegaly present.  Cardiovascular: Normal rate, regular rhythm, normal heart sounds and intact distal pulses. Exam reveals no gallop and no friction rub.  No murmur heard. Pulmonary/Chest: Effort normal. She has no wheezes. She has no rales.  Abdominal: Soft. Bowel sounds are normal. She exhibits no mass. There is no tenderness. There is no guarding.  Musculoskeletal: Normal range of motion. She exhibits no edema.  Lymphadenopathy:    She has no cervical adenopathy.  Neurological: She is alert. She has normal  reflexes.  Skin: Skin is warm and dry. She is not diaphoretic.  Psychiatric: Mood and affect normal.  Nursing note and vitals reviewed.     Assessment & Plan  Problem List Items Addressed This Visit    None    Visit Diagnoses    Acute maxillary sinusitis, recurrence not specified    -  Primary   Relevant Medications   amoxicillin-clavulanate (AUGMENTIN) 875-125 MG tablet   guaiFENesin-codeine (ROBITUSSIN AC) 100-10 MG/5ML syrup      Meds ordered this encounter  Medications  . amoxicillin-clavulanate (AUGMENTIN) 875-125 MG tablet    Sig: Take 1 tablet by mouth 2 (two) times daily.    Dispense:  20 tablet    Refill:  0  . guaiFENesin-codeine (ROBITUSSIN AC) 100-10 MG/5ML syrup  Sig: Take 5 mLs by mouth 3 (three) times daily as needed for cough.    Dispense:  150 mL    Refill:  0      Dr. [facility]  03/10/18

## 2018-03-19 ENCOUNTER — Other Ambulatory Visit: Payer: Self-pay

## 2018-03-19 MED ORDER — SERTRALINE HCL 100 MG PO TABS: 100.0000 mg | ORAL_TABLET | Freq: Every day | ORAL | 0 refills | Status: DC

## 2018-06-08 DIAGNOSIS — Z6821 Body mass index (BMI) 21.0-21.9, adult: Secondary | ICD-10-CM | POA: Diagnosis not present

## 2018-06-08 DIAGNOSIS — Z720 Tobacco use: Secondary | ICD-10-CM | POA: Diagnosis not present

## 2018-06-08 DIAGNOSIS — J0100 Acute maxillary sinusitis, unspecified: Secondary | ICD-10-CM | POA: Diagnosis not present

## 2018-06-08 DIAGNOSIS — J309 Allergic rhinitis, unspecified: Secondary | ICD-10-CM | POA: Diagnosis not present

## 2018-06-10 ENCOUNTER — Other Ambulatory Visit: Payer: Self-pay | Admitting: Family Medicine

## 2018-09-05 ENCOUNTER — Other Ambulatory Visit: Payer: Self-pay | Admitting: Family Medicine

## 2018-09-16 ENCOUNTER — Other Ambulatory Visit: Payer: Self-pay | Admitting: Family Medicine

## 2018-09-16 DIAGNOSIS — I10 Essential (primary) hypertension: Secondary | ICD-10-CM

## 2018-10-08 ENCOUNTER — Ambulatory Visit: Payer: Medicare Other | Admitting: Family Medicine

## 2018-10-08 ENCOUNTER — Encounter: Payer: Self-pay | Admitting: Family Medicine

## 2018-10-08 VITALS — BP 100/60 | HR 60 | Ht 66.0 in | Wt 130.0 lb

## 2018-10-08 DIAGNOSIS — J4521 Mild intermittent asthma with (acute) exacerbation: Secondary | ICD-10-CM

## 2018-10-08 DIAGNOSIS — I10 Essential (primary) hypertension: Secondary | ICD-10-CM

## 2018-10-08 DIAGNOSIS — R110 Nausea: Secondary | ICD-10-CM

## 2018-10-08 DIAGNOSIS — J0100 Acute maxillary sinusitis, unspecified: Secondary | ICD-10-CM

## 2018-10-08 DIAGNOSIS — Z23 Encounter for immunization: Secondary | ICD-10-CM

## 2018-10-08 DIAGNOSIS — E785 Hyperlipidemia, unspecified: Secondary | ICD-10-CM | POA: Diagnosis not present

## 2018-10-08 DIAGNOSIS — F324 Major depressive disorder, single episode, in partial remission: Secondary | ICD-10-CM

## 2018-10-08 MED ORDER — LOSARTAN POTASSIUM 50 MG PO TABS: 50.0000 mg | ORAL_TABLET | Freq: Every day | ORAL | 1 refills | Status: DC

## 2018-10-08 MED ORDER — ONDANSETRON HCL 4 MG PO TABS: 4.0000 mg | ORAL_TABLET | Freq: Three times a day (TID) | ORAL | 0 refills | Status: DC | PRN

## 2018-10-08 MED ORDER — CARVEDILOL 3.125 MG PO TABS: 3.1250 mg | ORAL_TABLET | Freq: Two times a day (BID) | ORAL | 1 refills | Status: DC

## 2018-10-08 MED ORDER — AMOXICILLIN-POT CLAVULANATE 875-125 MG PO TABS: 1.0000 | ORAL_TABLET | Freq: Two times a day (BID) | ORAL | 1 refills | Status: DC

## 2018-10-08 MED ORDER — SERTRALINE HCL 100 MG PO TABS: 100.0000 mg | ORAL_TABLET | Freq: Every day | ORAL | 1 refills | Status: DC

## 2018-10-08 MED ORDER — ATORVASTATIN CALCIUM 10 MG PO TABS: 10.0000 mg | ORAL_TABLET | Freq: Every day | ORAL | 1 refills | Status: DC

## 2018-10-08 MED ORDER — MONTELUKAST SODIUM 10 MG PO TABS: 10.0000 mg | ORAL_TABLET | Freq: Every day | ORAL | 5 refills | Status: DC

## 2018-10-08 NOTE — Progress Notes (Signed)
Date:  10/08/2018   Name:  Marilyn Molina   DOB:  03-21-1951   MRN:  [id number]   Chief Complaint: Hyperlipidemia; Hypertension; Depression; Sinusitis (coughing and has congestion, neauseated- wants "something to help me keep something down"); and Flu Vaccine Hyperlipidemia  This is a chronic problem. The current episode started more than 1 year ago. The problem is controlled. Recent lipid tests were reviewed and are normal. She has no history of chronic renal disease, diabetes, hypothyroidism, liver disease, obesity or nephrotic syndrome. There are no known factors aggravating her hyperlipidemia. Pertinent negatives include no chest pain, focal sensory loss, focal weakness, leg pain, myalgias or shortness of breath. Current antihyperlipidemic treatment includes statins. The current treatment provides moderate improvement of lipids. There are no compliance problems.  Risk factors for coronary artery disease include dyslipidemia and hypertension.  Hypertension  This is a chronic problem. The current episode started more than 1 year ago. The problem has been gradually improving since onset. The problem is controlled. Pertinent negatives include no anxiety, blurred vision, chest pain, headaches, malaise/fatigue, neck pain, orthopnea, palpitations, peripheral edema, PND, shortness of breath or sweats. There are no associated agents to hypertension. There are no known risk factors for coronary artery disease. Past treatments include beta blockers and angiotensin blockers. The current treatment provides moderate improvement. There are no compliance problems.  There is no history of angina, kidney disease, CAD/MI, CVA, heart failure, left ventricular hypertrophy, PVD or retinopathy. There is no history of chronic renal disease, a hypertension causing med or renovascular disease.  Depression         This is a chronic problem.  The current episode started more than 1 year ago.   The onset quality is  sudden.   The problem occurs intermittently.  Associated symptoms include no decreased concentration, no fatigue, no helplessness, no hopelessness, does not have insomnia, not irritable, no restlessness, no decreased interest, no appetite change, no body aches, no myalgias, no headaches, no indigestion, not sad and no suicidal ideas.  Past treatments include SSRIs - Selective serotonin reuptake inhibitors.  Compliance with treatment is good.  Previous treatment provided moderate relief.   Pertinent negatives include no hypothyroidism and no anxiety. Sinusitis  This is a new problem. The current episode started in the past 7 days (Monday). The problem has been waxing and waning since onset. There has been no fever. The pain is mild. Pertinent negatives include no chills, congestion, coughing, diaphoresis, ear pain, headaches, hoarse voice, neck pain, shortness of breath, sinus pressure, sneezing, sore throat or swollen glands. The treatment provided mild relief.     Review of Systems  Constitutional: Negative.  Negative for appetite change, chills, diaphoresis, fatigue, fever, malaise/fatigue and unexpected weight change.  HENT: Negative for congestion, ear discharge, ear pain, hoarse voice, rhinorrhea, sinus pressure, sneezing and sore throat.   Eyes: Negative for blurred vision, photophobia, pain, discharge, redness and itching.  Respiratory: Negative for cough, shortness of breath, wheezing and stridor.   Cardiovascular: Negative for chest pain, palpitations, orthopnea and PND.  Gastrointestinal: Negative for abdominal pain, blood in stool, constipation, diarrhea, nausea and vomiting.  Endocrine: Negative for cold intolerance, heat intolerance, polydipsia, polyphagia and polyuria.  Genitourinary: Negative for dysuria, flank pain, frequency, hematuria, menstrual problem, pelvic pain, urgency, vaginal bleeding and vaginal discharge.  Musculoskeletal: Negative for arthralgias, back pain, myalgias and  neck pain.  Skin: Negative for rash.  Allergic/Immunologic: Negative for environmental allergies and food allergies.  Neurological: Negative for dizziness, focal  weakness, weakness, light-headedness, numbness and headaches.  Hematological: Negative for adenopathy. Does not bruise/bleed easily.  Psychiatric/Behavioral: Positive for depression. Negative for decreased concentration, dysphoric mood and suicidal ideas. The patient is not nervous/anxious and does not have insomnia.     Patient Active Problem List   Diagnosis Date Noted  . Anxiety and depression 11/03/2012  . Cardiomyopathy (HCC) 10/29/2011  . Cigarette nicotine dependence without complication 10/29/2011    No Known Allergies  Past Surgical History:  Procedure Laterality Date  . OVARIAN CYST REMOVAL      Social History   Tobacco Use  . Smoking status: Current Every Day Smoker    Packs/day: 1.00    Types: Cigarettes  . Smokeless tobacco: Never Used  Substance Use Topics  . Alcohol use: No  . Drug use: No     Medication list has been reviewed and updated.  Current Meds  Medication Sig  . atorvastatin (LIPITOR) 10 MG tablet Take 1 tablet (10 mg total) by mouth daily.  . carvedilol (COREG) 3.125 MG tablet TAKE 1 TABLET (3.125 MG TOTAL) BY MOUTH 2 (TWO) TIMES DAILY WITH A MEAL.  Marland Kitchen losartan (COZAAR) 50 MG tablet Take 1 tablet (50 mg total) by mouth daily at 6 (six) AM.  . montelukast (SINGULAIR) 10 MG tablet TAKE 1 TABLET BY MOUTH EVERY DAY  . sertraline (ZOLOFT) 100 MG tablet TAKE 1 TABLET BY MOUTH EVERY DAY    PHQ 2/9 Scores 10/08/2018 08/20/2017  PHQ - 2 Score 0 0  PHQ- 9 Score 0 -    Physical Exam  Constitutional: She is oriented to person, place, and time. She appears well-developed and well-nourished. She is not irritable.  HENT:  Head: Normocephalic.  Right Ear: External ear normal.  Left Ear: External ear normal.  Mouth/Throat: Oropharynx is clear and moist.  Eyes: Pupils are equal, round, and  reactive to light. Conjunctivae and EOM are normal. Lids are everted and swept, no foreign bodies found. Left eye exhibits no hordeolum. No foreign body present in the left eye. Right conjunctiva is not injected. Left conjunctiva is not injected. No scleral icterus.  Neck: Normal range of motion. Neck supple. No JVD present. No tracheal deviation present. No thyromegaly present.  Cardiovascular: Normal rate, regular rhythm, normal heart sounds and intact distal pulses. Exam reveals no gallop and no friction rub.  No murmur heard. Pulmonary/Chest: Effort normal and breath sounds normal. No respiratory distress. She has no wheezes. She has no rales.  Abdominal: Soft. Bowel sounds are normal. She exhibits no mass. There is no hepatosplenomegaly. There is no tenderness. There is no rebound and no guarding.  Musculoskeletal: Normal range of motion. She exhibits no edema or tenderness.  Lymphadenopathy:    She has no cervical adenopathy.  Neurological: She is alert and oriented to person, place, and time. She has normal strength. She displays normal reflexes. No cranial nerve deficit.  Skin: Skin is warm. No rash noted.  Psychiatric: She has a normal mood and affect. Her mood appears not anxious. She does not exhibit a depressed mood.  Nursing note and vitals reviewed.   BP 100/60   Pulse 60   Ht 5\' 6"  (1.676 m)   Wt 130 lb (59 kg)   BMI 20.98 kg/m   Assessment and Plan:  1. Mild intermittent asthma with acute exacerbation Recurrent intermittent Controlled. Continue montelukast as needed. - montelukast (SINGULAIR) 10 MG tablet; Take 1 tablet (10 mg total) by mouth daily.  Dispense: 30 tablet; Refill: 5  2.  Essential hypertension Chronic Stable. Controlled. Continue losartan 50 mg daily and carvedilol 3.125 mg bid. Check renal panel.  - losartan (COZAAR) 50 MG tablet; Take 1 tablet (50 mg total) by mouth daily at 6 (six) AM.  Dispense: 90 tablet; Refill: 1 - carvedilol (COREG) 3.125 MG  tablet; Take 1 tablet (3.125 mg total) by mouth 2 (two) times daily with a meal.  Dispense: 180 tablet; Refill: 1 - Renal Function Panel  3. Hyperlipidemia, unspecified hyperlipidemia type Chronic Controlled Continue atorvastatin 10 mg Check lipid panel. - atorvastatin (LIPITOR) 10 MG tablet; Take 1 tablet (10 mg total) by mouth daily.  Dispense: 90 tablet; Refill: 1 - Lipid panel  4. Nausea Nausea intermittent ZOFRAN 4 mg prn. - ondansetron (ZOFRAN) 4 MG tablet; Take 1 tablet (4 mg total) by mouth every 8 (eight) hours as needed for nausea or vomiting.  Dispense: 20 tablet; Refill: 0  5. Acute maxillary sinusitis, recurrence not specified Acute Will intiate augmentin 875 bid for 10 days. - montelukast (SINGULAIR) 10 MG tablet; Take 1 tablet (10 mg total) by mouth daily.  Dispense: 30 tablet; Refill: 5 - amoxicillin-clavulanate (AUGMENTIN) 875-125 MG tablet; Take 1 tablet by mouth 2 (two) times daily.  Dispense: 20 tablet; Refill: 1  6. Major depressive disorder in partial remission, unspecified whether recurrent (HCC) Chronic stable controlled depression and anxiety. Continue sertraline 100 mg daily - sertraline (ZOLOFT) 100 MG tablet; Take 1 tablet (100 mg total) by mouth daily.  Dispense: 90 tablet; Refill: 1  7. Influenza vaccine needed Discussed and administered. - Flu vaccine HIGH DOSE PF   Dr. Elizabeth Sauer [facility]  10/08/2018

## 2018-10-09 LAB — RENAL FUNCTION PANEL
Albumin: 4 g/dL (ref 3.6–4.8)
BUN / CREAT RATIO: 14 (ref 12–28)
BUN: 11 mg/dL (ref 8–27)
CALCIUM: 9.3 mg/dL (ref 8.7–10.3)
CO2: 23 mmol/L (ref 20–29)
CREATININE: 0.8 mg/dL (ref 0.57–1.00)
Chloride: 101 mmol/L (ref 96–106)
GFR calc Af Amer: 89 mL/min/{1.73_m2} (ref >59)
GFR calc non Af Amer: 77 mL/min/{1.73_m2} (ref >59)
GLUCOSE: 89 mg/dL (ref 65–99)
PHOSPHORUS: 2.9 mg/dL (ref 2.5–4.5)
POTASSIUM: 4.2 mmol/L (ref 3.5–5.2)
SODIUM: 141 mmol/L (ref 134–144)

## 2018-10-09 LAB — LIPID PANEL
CHOL/HDL RATIO: 2.9 ratio (ref 0.0–4.4)
Cholesterol, Total: 137 mg/dL (ref 100–199)
HDL: 47 mg/dL (ref >39)
LDL Calculated: 58 mg/dL (ref 0–99)
Triglycerides: 160 mg/dL — ABNORMAL HIGH (ref 0–149)
VLDL Cholesterol Cal: 32 mg/dL (ref 5–40)

## 2018-10-20 ENCOUNTER — Telehealth: Payer: Self-pay

## 2018-10-20 DIAGNOSIS — J0100 Acute maxillary sinusitis, unspecified: Secondary | ICD-10-CM

## 2018-10-20 MED ORDER — AZITHROMYCIN 250 MG PO TABS: ORAL_TABLET | ORAL | 0 refills | Status: DC

## 2018-10-20 NOTE — Telephone Encounter (Signed)
Pt called in saying that she is going on a cruise this weekend, has had 10 days of Augmentin. Not completely cleared up, but no color to production at this time. Sending in ZPack as precaution to [facility]

## 2018-11-10 DIAGNOSIS — Z720 Tobacco use: Secondary | ICD-10-CM | POA: Diagnosis not present

## 2018-11-10 DIAGNOSIS — I429 Cardiomyopathy, unspecified: Secondary | ICD-10-CM | POA: Diagnosis not present

## 2018-11-10 DIAGNOSIS — I10 Essential (primary) hypertension: Secondary | ICD-10-CM | POA: Diagnosis not present

## 2019-04-03 ENCOUNTER — Other Ambulatory Visit: Payer: Self-pay | Admitting: Family Medicine

## 2019-04-03 DIAGNOSIS — E785 Hyperlipidemia, unspecified: Secondary | ICD-10-CM

## 2019-04-08 ENCOUNTER — Other Ambulatory Visit: Payer: Self-pay

## 2019-04-08 ENCOUNTER — Encounter: Payer: Self-pay | Admitting: Family Medicine

## 2019-04-08 ENCOUNTER — Ambulatory Visit (INDEPENDENT_AMBULATORY_CARE_PROVIDER_SITE_OTHER): Payer: Medicare Other | Admitting: Family Medicine

## 2019-04-08 VITALS — BP 120/70 | HR 80 | Ht 66.0 in | Wt 132.0 lb

## 2019-04-08 DIAGNOSIS — Z23 Encounter for immunization: Secondary | ICD-10-CM

## 2019-04-08 DIAGNOSIS — E785 Hyperlipidemia, unspecified: Secondary | ICD-10-CM | POA: Diagnosis not present

## 2019-04-08 DIAGNOSIS — I10 Essential (primary) hypertension: Secondary | ICD-10-CM | POA: Diagnosis not present

## 2019-04-08 DIAGNOSIS — F324 Major depressive disorder, single episode, in partial remission: Secondary | ICD-10-CM | POA: Diagnosis not present

## 2019-04-08 DIAGNOSIS — I429 Cardiomyopathy, unspecified: Secondary | ICD-10-CM

## 2019-04-08 DIAGNOSIS — J4521 Mild intermittent asthma with (acute) exacerbation: Secondary | ICD-10-CM

## 2019-04-08 DIAGNOSIS — J0100 Acute maxillary sinusitis, unspecified: Secondary | ICD-10-CM

## 2019-04-08 MED ORDER — LOSARTAN POTASSIUM 50 MG PO TABS: 50.0000 mg | ORAL_TABLET | Freq: Every day | ORAL | 1 refills | Status: DC

## 2019-04-08 MED ORDER — SERTRALINE HCL 100 MG PO TABS: 100.0000 mg | ORAL_TABLET | Freq: Every day | ORAL | 1 refills | Status: DC

## 2019-04-08 MED ORDER — ATORVASTATIN CALCIUM 10 MG PO TABS: 10.0000 mg | ORAL_TABLET | Freq: Every day | ORAL | 1 refills | Status: DC

## 2019-04-08 MED ORDER — AZITHROMYCIN 250 MG PO TABS: ORAL_TABLET | ORAL | 0 refills | Status: DC

## 2019-04-08 MED ORDER — MONTELUKAST SODIUM 10 MG PO TABS: 10.0000 mg | ORAL_TABLET | Freq: Every day | ORAL | 1 refills | Status: DC

## 2019-04-08 MED ORDER — CARVEDILOL 3.125 MG PO TABS: 3.1250 mg | ORAL_TABLET | Freq: Two times a day (BID) | ORAL | 1 refills | Status: DC

## 2019-04-08 NOTE — Progress Notes (Signed)
Date:  04/08/2019   Name:  Marilyn Molina   DOB:  Mar 25, 1951   MRN:  [id number]   Chief Complaint: Hypertension; Hyperlipidemia; Depression (PHQ9=4); and Allergic Rhinitis   Hypertension  This is a chronic problem. The current episode started more than 1 year ago. The problem is unchanged. The problem is controlled. Pertinent negatives include no anxiety, blurred vision, chest pain, headaches, malaise/fatigue, neck pain, orthopnea, palpitations, peripheral edema, PND, shortness of breath or sweats. There are no associated agents to hypertension. There are no known risk factors for coronary artery disease. Past treatments include beta blockers and angiotensin blockers. The current treatment provides moderate improvement. There are no compliance problems.  There is no history of angina, kidney disease, CAD/MI, CVA, heart failure, left ventricular hypertrophy, PVD or retinopathy. There is no history of chronic renal disease, a hypertension causing med or renovascular disease.  Hyperlipidemia  This is a chronic problem. The current episode started more than 1 year ago. The problem is controlled. Recent lipid tests were reviewed and are normal. She has no history of chronic renal disease, diabetes, hypothyroidism, liver disease, obesity or nephrotic syndrome. There are no known factors aggravating her hyperlipidemia. Pertinent negatives include no chest pain, focal sensory loss, focal weakness, leg pain, myalgias or shortness of breath. Current antihyperlipidemic treatment includes statins. The current treatment provides moderate improvement of lipids. There are no compliance problems.  Risk factors for coronary artery disease include dyslipidemia and hypertension.  Depression         This is a chronic problem.  The current episode started more than 1 year ago.   The onset quality is undetermined.   The problem occurs intermittently.  Associated symptoms include no decreased concentration, no fatigue,  no helplessness, no hopelessness, does not have insomnia, not irritable, no restlessness, no decreased interest, no appetite change, no body aches, no myalgias, no headaches, no indigestion, not sad and no suicidal ideas.     The symptoms are aggravated by nothing.  Past treatments include SSRIs - Selective serotonin reuptake inhibitors.  Compliance with treatment is good.  Past compliance problems include medication issues.  Previous treatment provided moderate relief.   Pertinent negatives include no hypothyroidism and no anxiety. Heart Problem  This is a chronic problem. The current episode started more than 1 year ago. The problem occurs intermittently. The problem has been gradually improving. Pertinent negatives include no abdominal pain, anorexia, arthralgias, change in bowel habit, chest pain, chills, congestion, coughing, diaphoresis, fatigue, fever, headaches, joint swelling, myalgias, nausea, neck pain, numbness, rash, sore throat, swollen glands, vomiting or weakness. The symptoms are aggravated by smoking. The treatment provided moderate relief.  Sinusitis  This is a new problem. The current episode started yesterday. The problem has been gradually worsening since onset. There has been no fever. The pain is mild. Pertinent negatives include no chills, congestion, coughing, diaphoresis, ear pain, headaches, hoarse voice, neck pain, shortness of breath, sinus pressure, sneezing, sore throat or swollen glands.    Review of Systems  Constitutional: Negative.  Negative for appetite change, chills, diaphoresis, fatigue, fever, malaise/fatigue and unexpected weight change.  HENT: Negative for congestion, ear discharge, ear pain, hoarse voice, rhinorrhea, sinus pressure, sneezing and sore throat.   Eyes: Negative for blurred vision, photophobia, pain, discharge, redness and itching.  Respiratory: Negative for cough, shortness of breath, wheezing and stridor.   Cardiovascular: Negative for chest  pain, palpitations, orthopnea and PND.  Gastrointestinal: Negative for abdominal pain, anorexia, blood in stool, change  in bowel habit, constipation, diarrhea, nausea and vomiting.  Endocrine: Negative for cold intolerance, heat intolerance, polydipsia, polyphagia and polyuria.  Genitourinary: Negative for dysuria, flank pain, frequency, hematuria, menstrual problem, pelvic pain, urgency, vaginal bleeding and vaginal discharge.  Musculoskeletal: Negative for arthralgias, back pain, joint swelling, myalgias and neck pain.  Skin: Negative for rash.  Allergic/Immunologic: Negative for environmental allergies and food allergies.  Neurological: Negative for dizziness, focal weakness, weakness, light-headedness, numbness and headaches.  Hematological: Negative for adenopathy. Does not bruise/bleed easily.  Psychiatric/Behavioral: Positive for depression. Negative for decreased concentration, dysphoric mood and suicidal ideas. The patient is not nervous/anxious and does not have insomnia.     Patient Active Problem List   Diagnosis Date Noted  . Anxiety and depression 11/03/2012  . Cardiomyopathy (HCC) 10/29/2011  . Cigarette nicotine dependence without complication 10/29/2011    No Known Allergies  Past Surgical History:  Procedure Laterality Date  . OVARIAN CYST REMOVAL      Social History   Tobacco Use  . Smoking status: Current Every Day Smoker    Packs/day: 1.00    Types: Cigarettes  . Smokeless tobacco: Never Used  Substance Use Topics  . Alcohol use: No  . Drug use: No     Medication list has been reviewed and updated.  Current Meds  Medication Sig  . atorvastatin (LIPITOR) 10 MG tablet TAKE 1 TABLET BY MOUTH EVERY DAY  . carvedilol (COREG) 3.125 MG tablet Take 1 tablet (3.125 mg total) by mouth 2 (two) times daily with a meal.  . losartan (COZAAR) 50 MG tablet Take 1 tablet (50 mg total) by mouth daily at 6 (six) AM.  . montelukast (SINGULAIR) 10 MG tablet Take 1 tablet  (10 mg total) by mouth daily.  . sertraline (ZOLOFT) 100 MG tablet Take 1 tablet (100 mg total) by mouth daily.    PHQ 2/9 Scores 04/08/2019 10/08/2018 08/20/2017  PHQ - 2 Score 2 0 0  PHQ- 9 Score 4 0 -    BP Readings from Last 3 Encounters:  04/08/19 120/70  10/08/18 100/60  03/10/18 110/80    Physical Exam Vitals signs and nursing note reviewed.  Constitutional:      General: She is not irritable.She is not in acute distress.    Appearance: She is not diaphoretic.  HENT:     Head: Normocephalic and atraumatic.     Right Ear: Tympanic membrane, ear canal and external ear normal.     Left Ear: Tympanic membrane, ear canal and external ear normal.     Nose: Nose normal. No congestion or rhinorrhea.     Mouth/Throat:     Mouth: Mucous membranes are moist.  Eyes:     General:        Right eye: No discharge.        Left eye: No discharge.     Conjunctiva/sclera: Conjunctivae normal.     Pupils: Pupils are equal, round, and reactive to light.  Neck:     Musculoskeletal: Normal range of motion and neck supple. No neck rigidity or muscular tenderness.     Thyroid: No thyromegaly.     Vascular: No JVD.  Cardiovascular:     Rate and Rhythm: Normal rate and regular rhythm.     Pulses: Normal pulses.     Heart sounds: Normal heart sounds. No murmur. No friction rub. No gallop.   Pulmonary:     Effort: Pulmonary effort is normal.     Breath sounds: Normal breath sounds. No  wheezing, rhonchi or rales.  Abdominal:     General: Bowel sounds are normal.     Palpations: Abdomen is soft. There is no mass.     Tenderness: There is no abdominal tenderness. There is no guarding.  Musculoskeletal: Normal range of motion.  Lymphadenopathy:     Cervical: No cervical adenopathy.  Skin:    General: Skin is warm and dry.  Neurological:     Mental Status: She is alert.     Deep Tendon Reflexes: Reflexes are normal and symmetric.     Wt Readings from Last 3 Encounters:  04/08/19 132 lb  (59.9 kg)  10/08/18 130 lb (59 kg)  03/10/18 128 lb (58.1 kg)    BP 120/70   Pulse 80   Ht 5\' 6"  (1.676 m)   Wt 132 lb (59.9 kg)   BMI 21.31 kg/m   Assessment and Plan: 1. Major depressive disorder in partial remission, unspecified whether recurrent (HCC) Controlled.  Chronic.  Refill sertraline 100 mg once a day. - sertraline (ZOLOFT) 100 MG tablet; Take 1 tablet (100 mg total) by mouth daily.  Dispense: 90 tablet; Refill: 1  2. Hyperlipidemia, unspecified hyperlipidemia type Controlled.  Chronic.  Continue atorvastatin 10 mg once a day will check lipid panel. - atorvastatin (LIPITOR) 10 MG tablet; Take 1 tablet (10 mg total) by mouth daily.  Dispense: 90 tablet; Refill: 1 - Lipid Panel With LDL/HDL Ratio  3. Essential hypertension Controlled.  Chronic.  Continue losartan 50 mg 1 a day and carvedilol 3.125 once a day.  Renal function panel will be done. - Renal Function Panel - losartan (COZAAR) 50 MG tablet; Take 1 tablet (50 mg total) by mouth daily at 6 (six) AM.  Dispense: 90 tablet; Refill: 1 - carvedilol (COREG) 3.125 MG tablet; Take 1 tablet (3.125 mg total) by mouth 2 (two) times daily with a meal.  Dispense: 180 tablet; Refill: 1  4. Mild intermittent asthma with acute exacerbation Patient has allergies for which she takes antihistamines will refill her Singulair 10 mg to use in tandem. - montelukast (SINGULAIR) 10 MG tablet; Take 1 tablet (10 mg total) by mouth daily.  Dispense: 90 tablet; Refill: 1  5. Acute maxillary sinusitis, recurrence not specified And has an acute sinusitis with discomfort in the maxillary areas will initiate azithromycin 250 mg 2 today followed by 1 a day for 4 days. - montelukast (SINGULAIR) 10 MG tablet; Take 1 tablet (10 mg total) by mouth daily.  Dispense: 90 tablet; Refill: 1  6. Cardiomyopathy, unspecified type [facility]) Patient has a history of cardiomyopathy for which she is followed by cardiology this was reviewed and patient has an  upcoming appointment in the future.  7. Acute non-recurrent maxillary sinusitis As noted acute new onset will initiate azithromycin as prescribed - azithromycin (ZITHROMAX) 250 MG tablet; 2 today then 1 a day for4 days  Dispense: 6 tablet; Refill: 0  8. Need for 23-polyvalent pneumococcal polysaccharide vaccine Discussed and administered. - Pneumococcal polysaccharide vaccine 23-valent greater than or equal to 2yo subcutaneous/IM

## 2019-04-09 LAB — RENAL FUNCTION PANEL
Albumin: 4.5 g/dL (ref 3.8–4.8)
BUN/Creatinine Ratio: 21 (ref 12–28)
BUN: 16 mg/dL (ref 8–27)
CO2: 22 mmol/L (ref 20–29)
Calcium: 9.7 mg/dL (ref 8.7–10.3)
Chloride: 103 mmol/L (ref 96–106)
Creatinine, Ser: 0.75 mg/dL (ref 0.57–1.00)
GFR calc Af Amer: 96 mL/min/{1.73_m2} (ref >59)
GFR calc non Af Amer: 83 mL/min/{1.73_m2} (ref >59)
Glucose: 89 mg/dL (ref 65–99)
Phosphorus: 4.6 mg/dL — ABNORMAL HIGH (ref 3.0–4.3)
Potassium: 4.5 mmol/L (ref 3.5–5.2)
Sodium: 142 mmol/L (ref 134–144)

## 2019-04-09 LAB — LIPID PANEL WITH LDL/HDL RATIO
Cholesterol, Total: 177 mg/dL (ref 100–199)
HDL: 62 mg/dL (ref >39)
LDL Calculated: 93 mg/dL (ref 0–99)
LDl/HDL Ratio: 1.5 ratio (ref 0.0–3.2)
Triglycerides: 111 mg/dL (ref 0–149)
VLDL Cholesterol Cal: 22 mg/dL (ref 5–40)

## 2019-04-15 ENCOUNTER — Other Ambulatory Visit: Payer: Self-pay | Admitting: Family Medicine

## 2019-04-15 DIAGNOSIS — I10 Essential (primary) hypertension: Secondary | ICD-10-CM

## 2019-08-20 ENCOUNTER — Other Ambulatory Visit: Payer: Self-pay

## 2019-08-20 ENCOUNTER — Ambulatory Visit (INDEPENDENT_AMBULATORY_CARE_PROVIDER_SITE_OTHER): Payer: Medicare Other | Admitting: Family Medicine

## 2019-08-20 ENCOUNTER — Encounter: Payer: Self-pay | Admitting: Family Medicine

## 2019-08-20 VITALS — Temp 98.7°F

## 2019-08-20 DIAGNOSIS — R05 Cough: Secondary | ICD-10-CM

## 2019-08-20 DIAGNOSIS — J0100 Acute maxillary sinusitis, unspecified: Secondary | ICD-10-CM

## 2019-08-20 DIAGNOSIS — R059 Cough, unspecified: Secondary | ICD-10-CM

## 2019-08-20 MED ORDER — AMOXICILLIN-POT CLAVULANATE 875-125 MG PO TABS: 1.0000 | ORAL_TABLET | Freq: Two times a day (BID) | ORAL | 0 refills | Status: DC

## 2019-08-20 MED ORDER — GUAIFENESIN-CODEINE 100-10 MG/5ML PO SYRP: 5.0000 mL | ORAL_SOLUTION | Freq: Four times a day (QID) | ORAL | 0 refills | Status: DC | PRN

## 2019-08-20 NOTE — Progress Notes (Signed)
Date:  08/20/2019   Name:  Marilyn Molina   DOB:  [birth year]   MRN:  [id number]   Chief Complaint: Sinusitis (ears and around eyes are tender, glands swollen, drainage- thick yellow x 1 week- taking mucinex)  I connected withthis patient, Marilyn Molina, by telephoneat the patient's home.  I verified that I am speaking with the correct person using two identifiers. This visit was conducted via telephone due to the Covid-19 outbreak from my office at [facility] in [place], [place]. I discussed the limitations, risks, security and privacy concerns of performing an evaluation and management service by telephone. I also discussed with the patient that there may be a patient responsible charge related to this service. The patient expressed understanding and agreed to proceed.  Sinusitis This is a new problem. The current episode started in the past 7 days. The problem has been gradually worsening since onset. There has been no fever. The pain is moderate. Associated symptoms include congestion, ear pain and sinus pressure. Pertinent negatives include no chills, coughing, diaphoresis, headaches, hoarse voice, neck pain, shortness of breath, sneezing, sore throat or swollen glands. Treatments tried: mucinex. The treatment provided mild relief.    Review of Systems  Constitutional: Negative.  Negative for chills, diaphoresis, fatigue, fever and unexpected weight change.  HENT: Positive for congestion, ear pain and sinus pressure. Negative for ear discharge, hoarse voice, rhinorrhea, sneezing and sore throat.   Eyes: Negative for photophobia, pain, discharge, redness and itching.  Respiratory: Negative for cough, shortness of breath, wheezing and stridor.   Gastrointestinal: Negative for abdominal pain, blood in stool, constipation, diarrhea, nausea and vomiting.  Endocrine: Negative for cold intolerance, heat intolerance, polydipsia, polyphagia and polyuria.  Genitourinary: Negative for  dysuria, flank pain, frequency, hematuria, menstrual problem, pelvic pain, urgency, vaginal bleeding and vaginal discharge.  Musculoskeletal: Negative for arthralgias, back pain, myalgias and neck pain.  Skin: Negative for rash.  Allergic/Immunologic: Negative for environmental allergies and food allergies.  Neurological: Negative for dizziness, weakness, light-headedness, numbness and headaches.  Hematological: Negative for adenopathy. Does not bruise/bleed easily.  Psychiatric/Behavioral: Negative for dysphoric mood. The patient is not nervous/anxious.     Patient Active Problem List   Diagnosis Date Noted  . Anxiety and depression 11/03/2012  . Cardiomyopathy ([place]) 10/29/2011  . Cigarette nicotine dependence without complication 99/83/3825    No Known Allergies  Past Surgical History:  Procedure Laterality Date  . OVARIAN CYST REMOVAL      Social History   Tobacco Use  . Smoking status: Current Every Day Smoker    Packs/day: 1.00    Types: Cigarettes  . Smokeless tobacco: Never Used  Substance Use Topics  . Alcohol use: No  . Drug use: No     Medication list has been reviewed and updated.  Current Meds  Medication Sig  . atorvastatin (LIPITOR) 10 MG tablet Take 1 tablet (10 mg total) by mouth daily.  . carvedilol (COREG) 3.125 MG tablet Take 1 tablet (3.125 mg total) by mouth 2 (two) times daily with a meal.  . losartan (COZAAR) 50 MG tablet Take 1 tablet (50 mg total) by mouth daily at 6 (six) AM.  . montelukast (SINGULAIR) 10 MG tablet Take 1 tablet (10 mg total) by mouth daily.  . sertraline (ZOLOFT) 100 MG tablet Take 1 tablet (100 mg total) by mouth daily.  . [DISCONTINUED] losartan (COZAAR) 100 MG tablet TAKE 1/2 TABLET (50 MG TOTAL) BY MOUTH DAILY AT 6 (SIX) AM.  PHQ 2/9 Scores 08/20/2019 04/08/2019 10/08/2018 08/20/2017  PHQ - 2 Score 0 2 0 0  PHQ- 9 Score 0 4 0 -    BP Readings from Last 3 Encounters:  04/08/19 120/70  10/08/18 100/60  03/10/18 110/80     Physical Exam Nursing note reviewed.  HENT:     Head:     Jaw: There is normal jaw occlusion.     Nose:     Right Sinus: No maxillary sinus tenderness or frontal sinus tenderness.     Left Sinus: No maxillary sinus tenderness or frontal sinus tenderness.     Wt Readings from Last 3 Encounters:  04/08/19 132 lb (59.9 kg)  10/08/18 130 lb (59 kg)  03/10/18 128 lb (58.1 kg)    Temp 98.7 F (37.1 C) (Oral)   Assessment and Plan:  1. Acute non-recurrent maxillary sinusitis Telemedicine note visit set up with patient.  This is an acute sinusitis.  This is persistent.  Will initiate Augmentin 875 mg twice a day for 10 days. - amoxicillin-clavulanate (AUGMENTIN) 875-125 MG tablet; Take 1 tablet by mouth 2 (two) times daily.  Dispense: 20 tablet; Refill: 0  2. Cough Patient has a cough particularly at night.  Will initiate Robitussin-AC 1 teaspoon every 6-8 hours as needed cough patient was reminded that she needs to get a flu shot this year. - guaiFENesin-codeine (ROBITUSSIN AC) 100-10 MG/5ML syrup; Take 5 mLs by mouth 4 (four) times daily as needed for cough.  Dispense: 118 mL; Refill: 0  I spent 10 minutes with this patient, More than 50% of that time was spent in face to face education, counseling and care coordination.

## 2019-10-01 ENCOUNTER — Other Ambulatory Visit: Payer: Self-pay | Admitting: Family Medicine

## 2019-10-01 DIAGNOSIS — J0100 Acute maxillary sinusitis, unspecified: Secondary | ICD-10-CM

## 2019-10-01 DIAGNOSIS — I10 Essential (primary) hypertension: Secondary | ICD-10-CM

## 2019-10-01 DIAGNOSIS — J4521 Mild intermittent asthma with (acute) exacerbation: Secondary | ICD-10-CM

## 2019-10-09 ENCOUNTER — Other Ambulatory Visit: Payer: Self-pay | Admitting: Family Medicine

## 2019-10-09 DIAGNOSIS — I10 Essential (primary) hypertension: Secondary | ICD-10-CM

## 2019-10-15 ENCOUNTER — Other Ambulatory Visit: Payer: Self-pay

## 2019-10-15 ENCOUNTER — Ambulatory Visit (INDEPENDENT_AMBULATORY_CARE_PROVIDER_SITE_OTHER): Payer: Medicare Other | Admitting: Family Medicine

## 2019-10-15 ENCOUNTER — Encounter: Payer: Self-pay | Admitting: Family Medicine

## 2019-10-15 VITALS — BP 124/62 | HR 83 | Ht 66.0 in | Wt 131.0 lb

## 2019-10-15 DIAGNOSIS — I10 Essential (primary) hypertension: Secondary | ICD-10-CM

## 2019-10-15 DIAGNOSIS — R69 Illness, unspecified: Secondary | ICD-10-CM

## 2019-10-15 DIAGNOSIS — J4521 Mild intermittent asthma with (acute) exacerbation: Secondary | ICD-10-CM

## 2019-10-15 DIAGNOSIS — J0100 Acute maxillary sinusitis, unspecified: Secondary | ICD-10-CM

## 2019-10-15 DIAGNOSIS — Z122 Encounter for screening for malignant neoplasm of respiratory organs: Secondary | ICD-10-CM

## 2019-10-15 DIAGNOSIS — E785 Hyperlipidemia, unspecified: Secondary | ICD-10-CM

## 2019-10-15 DIAGNOSIS — F324 Major depressive disorder, single episode, in partial remission: Secondary | ICD-10-CM

## 2019-10-15 MED ORDER — SERTRALINE HCL 100 MG PO TABS: 100.0000 mg | ORAL_TABLET | Freq: Every day | ORAL | 1 refills | Status: DC

## 2019-10-15 MED ORDER — CARVEDILOL 3.125 MG PO TABS: 3.1250 mg | ORAL_TABLET | Freq: Two times a day (BID) | ORAL | 1 refills | Status: DC

## 2019-10-15 MED ORDER — LOSARTAN POTASSIUM 50 MG PO TABS: 50.0000 mg | ORAL_TABLET | Freq: Every day | ORAL | 1 refills | Status: DC

## 2019-10-15 MED ORDER — ATORVASTATIN CALCIUM 10 MG PO TABS: 10.0000 mg | ORAL_TABLET | Freq: Every day | ORAL | 1 refills | Status: DC

## 2019-10-15 MED ORDER — MONTELUKAST SODIUM 10 MG PO TABS: 10.0000 mg | ORAL_TABLET | Freq: Every day | ORAL | 1 refills | Status: DC

## 2019-10-15 NOTE — Progress Notes (Signed)
Date:  10/15/2019   Name:  Marilyn Molina   DOB:  Apr 03, 1951   MRN:  [id number]   Chief Complaint: Depression (PHQ9=0 and GAD7=0), Hypertension, Hyperlipidemia, and Allergic Rhinitis   Depression        This is a chronic problem.  The current episode started more than 1 year ago.   The onset quality is gradual.   The problem occurs intermittently.  The problem has been gradually improving since onset.  Associated symptoms include no decreased concentration, no fatigue, no helplessness, no hopelessness, does not have insomnia, not irritable, no restlessness, no decreased interest, no appetite change, no body aches, no myalgias, no headaches, no indigestion, not sad and no suicidal ideas.     The symptoms are aggravated by nothing.  Past treatments include SSRIs - Selective serotonin reuptake inhibitors.  Compliance with treatment is good.  Previous treatment provided mild relief.   Pertinent negatives include no hypothyroidism and no anxiety. Hypertension This is a chronic problem. The current episode started more than 1 year ago. The problem has been waxing and waning since onset. The problem is controlled. Pertinent negatives include no anxiety, blurred vision, chest pain, headaches, malaise/fatigue, neck pain, orthopnea, palpitations, peripheral edema, PND, shortness of breath or sweats. There are no associated agents to hypertension. There are no known risk factors for coronary artery disease. Past treatments include angiotensin blockers, beta blockers and alpha 1 blockers. The current treatment provides moderate improvement. There are no compliance problems.  There is no history of angina, kidney disease, CAD/MI, CVA, heart failure, left ventricular hypertrophy, PVD or retinopathy. There is no history of chronic renal disease, a hypertension causing med or renovascular disease.  Hyperlipidemia This is a chronic problem. The current episode started more than 1 year ago. The problem is  uncontrolled. Recent lipid tests were reviewed and are normal. She has no history of chronic renal disease, diabetes, hypothyroidism, liver disease, obesity or nephrotic syndrome. Factors aggravating her hyperlipidemia include thiazides. Pertinent negatives include no chest pain, focal sensory loss, focal weakness, leg pain, myalgias or shortness of breath. Current antihyperlipidemic treatment includes statins. The current treatment provides moderate improvement of lipids. There are no compliance problems.  Risk factors for coronary artery disease include dyslipidemia and hypertension.    Lab Results  Component Value Date   CREATININE 0.75 04/08/2019   BUN 16 04/08/2019   NA 142 04/08/2019   K 4.5 04/08/2019   CL 103 04/08/2019   CO2 22 04/08/2019   Lab Results  Component Value Date   CHOL 177 04/08/2019   HDL 62 04/08/2019   LDLCALC 93 04/08/2019   TRIG 111 04/08/2019   CHOLHDL 2.9 10/08/2018   No results found for: TSH No results found for: HGBA1C   Review of Systems  Constitutional: Negative for appetite change, chills, fatigue, fever and malaise/fatigue.  HENT: Negative for drooling, ear discharge, ear pain and sore throat.   Eyes: Negative for blurred vision.  Respiratory: Negative for cough, shortness of breath and wheezing.   Cardiovascular: Negative for chest pain, palpitations, orthopnea, leg swelling and PND.  Gastrointestinal: Negative for abdominal pain, blood in stool, constipation, diarrhea and nausea.  Endocrine: Negative for polydipsia.  Genitourinary: Negative for dysuria, frequency, hematuria and urgency.  Musculoskeletal: Negative for back pain, myalgias and neck pain.  Skin: Negative for rash.  Allergic/Immunologic: Negative for environmental allergies.  Neurological: Negative for dizziness, focal weakness and headaches.  Hematological: Does not bruise/bleed easily.  Psychiatric/Behavioral: Positive for depression. Negative for  decreased concentration and  suicidal ideas. The patient is not nervous/anxious and does not have insomnia.     Patient Active Problem List   Diagnosis Date Noted  . Anxiety and depression 11/03/2012  . Cardiomyopathy ([facility]) 10/29/2011  . Cigarette nicotine dependence without complication 10/29/2011    No Known Allergies  Past Surgical History:  Procedure Laterality Date  . OVARIAN CYST REMOVAL      Social History   Tobacco Use  . Smoking status: Current Every Day Smoker    Packs/day: 1.00    Types: Cigarettes  . Smokeless tobacco: Never Used  Substance Use Topics  . Alcohol use: No  . Drug use: No     Medication list has been reviewed and updated.  Current Meds  Medication Sig  . atorvastatin (LIPITOR) 10 MG tablet Take 1 tablet (10 mg total) by mouth daily.  . carvedilol (COREG) 3.125 MG tablet TAKE 1 TABLET (3.125 MG TOTAL) BY MOUTH 2 (TWO) TIMES DAILY WITH A MEAL.  Marland Kitchen losartan (COZAAR) 50 MG tablet TAKE 1 TABLET (50 MG TOTAL) BY MOUTH DAILY AT 6 (SIX) AM.  . montelukast (SINGULAIR) 10 MG tablet TAKE 1 TABLET BY MOUTH EVERY DAY  . sertraline (ZOLOFT) 100 MG tablet Take 1 tablet (100 mg total) by mouth daily.  . [DISCONTINUED] guaiFENesin-codeine (ROBITUSSIN AC) 100-10 MG/5ML syrup Take 5 mLs by mouth 4 (four) times daily as needed for cough.    PHQ 2/9 Scores 10/15/2019 08/20/2019 04/08/2019 10/08/2018  PHQ - 2 Score 0 0 2 0  PHQ- 9 Score 0 0 4 0    BP Readings from Last 3 Encounters:  10/15/19 124/62  04/08/19 120/70  10/08/18 100/60    Physical Exam Vitals signs and nursing note reviewed.  Constitutional:      General: She is not irritable.    Appearance: She is well-developed.  HENT:     Head: Normocephalic.     Right Ear: Tympanic membrane, ear canal and external ear normal.     Left Ear: Tympanic membrane, ear canal and external ear normal.     Nose: Nose normal.  Eyes:     General: Lids are everted, no foreign bodies appreciated. No scleral icterus.       Left eye: No foreign  body or hordeolum.     Conjunctiva/sclera: Conjunctivae normal.     Right eye: Right conjunctiva is not injected.     Left eye: Left conjunctiva is not injected.     Pupils: Pupils are equal, round, and reactive to light.  Neck:     Musculoskeletal: Normal range of motion and neck supple. No muscular tenderness.     Thyroid: No thyromegaly.     Vascular: No carotid bruit or JVD.     Trachea: No tracheal deviation.  Cardiovascular:     Rate and Rhythm: Normal rate and regular rhythm.     Heart sounds: Normal heart sounds. No murmur. No friction rub. No gallop.   Pulmonary:     Effort: Pulmonary effort is normal. No respiratory distress.     Breath sounds: Normal breath sounds. No wheezing, rhonchi or rales.  Abdominal:     General: Bowel sounds are normal.     Palpations: Abdomen is soft. There is no mass.     Tenderness: There is no abdominal tenderness. There is no guarding or rebound.  Musculoskeletal: Normal range of motion.        General: No tenderness, deformity or signs of injury.  Lymphadenopathy:  Cervical: No cervical adenopathy.  Skin:    General: Skin is warm.     Findings: No rash.  Neurological:     Mental Status: She is alert and oriented to person, place, and time.     Cranial Nerves: No cranial nerve deficit.     Deep Tendon Reflexes: Reflexes normal.  Psychiatric:        Mood and Affect: Mood is not anxious or depressed.     Wt Readings from Last 3 Encounters:  10/15/19 131 lb (59.4 kg)  04/08/19 132 lb (59.9 kg)  10/08/18 130 lb (59 kg)    BP 124/62   Pulse 83   Ht 5\' 6"  (1.676 m)   Wt 131 lb (59.4 kg)   SpO2 98%   BMI 21.14 kg/m   Assessment and Plan: 1. Hyperlipidemia, unspecified hyperlipidemia type Chronic.  Controlled.  Stable.  Continue atorvastatin 10 mg once a day.  Will check lipid panel. - atorvastatin (LIPITOR) 10 MG tablet; Take 1 tablet (10 mg total) by mouth daily.  Dispense: 90 tablet; Refill: 1  2. Essential hypertension  Chronic.  Controlled.  Stable.  Will check renal function panel.  Will continue Coreg 3.125 twice a day and losartan 50 mg once a day. - carvedilol (COREG) 3.125 MG tablet; Take 1 tablet (3.125 mg total) by mouth 2 (two) times daily with a meal.  Dispense: 180 tablet; Refill: 1 - losartan (COZAAR) 50 MG tablet; Take 1 tablet (50 mg total) by mouth daily at 6 (six) AM.  Dispense: 90 tablet; Refill: 1 - Renal Function Panel  3. Mild intermittent asthma with acute exacerbation Chronic.  Controlled.  Stable.  We will continue Singulair. - montelukast (SINGULAIR) 10 MG tablet; Take 1 tablet (10 mg total) by mouth daily.  Dispense: 90 tablet; Refill: 1  4. Acute maxillary sinusitis, recurrence not specified Chronic.  Controlled.  Continue Singulair 10 mg once a day. - montelukast (SINGULAIR) 10 MG tablet; Take 1 tablet (10 mg total) by mouth daily.  Dispense: 90 tablet; Refill: 1  5. Major depressive disorder in partial remission, unspecified whether recurrent (HCC) PHQ 0 gad score is 0 chronic.  Stable.  We will continue sertraline 100 mg once a day. - sertraline (ZOLOFT) 100 MG tablet; Take 1 tablet (100 mg total) by mouth daily.  Dispense: 90 tablet; Refill: 1  6. Taking medication for chronic disease Patient is on medications that we will check hepatic function panel to rule out elevated transaminase levels. - Hepatic function panel  7.  Patient has a greater than 30-pack-year history of smoking and she is currently continue to smoke there has been a steady weight loss over a 2-year interval which is not been intentional.  We will refer her to [facility] for evaluation and possible low-dose screening of the chest.  Melinda Crutch notified to contact patient concerning this evaluation which has been discussed with her.

## 2019-10-16 LAB — RENAL FUNCTION PANEL
Albumin: 4.5 g/dL (ref 3.8–4.8)
BUN/Creatinine Ratio: 21 (ref 12–28)
BUN: 17 mg/dL (ref 8–27)
CO2: 21 mmol/L (ref 20–29)
Calcium: 9.7 mg/dL (ref 8.7–10.3)
Chloride: 105 mmol/L (ref 96–106)
Creatinine, Ser: 0.82 mg/dL (ref 0.57–1.00)
GFR calc Af Amer: 86 mL/min/{1.73_m2} (ref >59)
GFR calc non Af Amer: 74 mL/min/{1.73_m2} (ref >59)
Glucose: 105 mg/dL — ABNORMAL HIGH (ref 65–99)
Phosphorus: 4.4 mg/dL — ABNORMAL HIGH (ref 3.0–4.3)
Potassium: 4.6 mmol/L (ref 3.5–5.2)
Sodium: 142 mmol/L (ref 134–144)

## 2019-10-16 LAB — HEPATIC FUNCTION PANEL
ALT: 14 IU/L (ref 0–32)
AST: 12 IU/L (ref 0–40)
Alkaline Phosphatase: 72 IU/L (ref 39–117)
Bilirubin Total: 0.3 mg/dL (ref 0.0–1.2)
Bilirubin, Direct: 0.09 mg/dL (ref 0.00–0.40)
Total Protein: 7.1 g/dL (ref 6.0–8.5)

## 2019-10-19 ENCOUNTER — Telehealth: Payer: Self-pay | Admitting: *Deleted

## 2019-10-19 ENCOUNTER — Encounter: Payer: Self-pay | Admitting: *Deleted

## 2019-10-19 DIAGNOSIS — Z122 Encounter for screening for malignant neoplasm of respiratory organs: Secondary | ICD-10-CM

## 2019-10-19 DIAGNOSIS — Z87891 Personal history of nicotine dependence: Secondary | ICD-10-CM

## 2019-10-19 NOTE — Telephone Encounter (Signed)
Received referral for initial lung cancer screening scan. Contacted patient and obtained smoking history,(current, 70.5 pack year) as well as answering questions related to screening process. Patient denies signs of lung cancer such as weight loss or hemoptysis. Patient denies comorbidity that would prevent curative treatment if lung cancer were found. Patient is scheduled for shared decision making visit and CT scan on 11/03/19 at 1045am.

## 2019-10-19 NOTE — Telephone Encounter (Signed)
Received referral for low dose lung cancer screening CT scan. Message left at phone number listed in EMR for patient to call me back to facilitate scheduling scan.  

## 2019-11-03 ENCOUNTER — Inpatient Hospital Stay: Payer: Medicare Other | Attending: Oncology | Admitting: Oncology

## 2019-11-03 ENCOUNTER — Ambulatory Visit
Admission: RE | Admit: 2019-11-03 | Discharge: 2019-11-03 | Disposition: A | Discharge status: Home / Self Care | Payer: Medicare Other | Source: Ambulatory Visit | Attending: Oncology | Admitting: Oncology

## 2019-11-03 ENCOUNTER — Other Ambulatory Visit: Payer: Self-pay

## 2019-11-03 DIAGNOSIS — Z87891 Personal history of nicotine dependence: Secondary | ICD-10-CM | POA: Insufficient documentation

## 2019-11-03 DIAGNOSIS — Z122 Encounter for screening for malignant neoplasm of respiratory organs: Secondary | ICD-10-CM | POA: Insufficient documentation

## 2019-11-03 NOTE — Progress Notes (Signed)
Virtual Visit via Video Note  I connected with Marilyn Molina on 11/03/19 at 10:45 AM EST by a video enabled telemedicine application and verified that I am speaking with the correct person using two identifiers.  Location: Patient: [facility] Provider: Office   I discussed the limitations of evaluation and management by telemedicine and the availability of in person appointments. The patient expressed understanding and agreed to proceed.  I discussed the assessment and treatment plan with the patient. The patient was provided an opportunity to ask questions and all were answered. The patient agreed with the plan and demonstrated an understanding of the instructions.   The patient was advised to call back or seek an in-person evaluation if the symptoms worsen or if the condition fails to improve as anticipated.   In accordance with CMS guidelines, patient has met eligibility criteria including age, absence of signs or symptoms of lung cancer.  Social History   Tobacco Use  . Smoking status: Current Every Day Smoker    Packs/day: 1.50    Years: 47.00    Pack years: 70.50    Types: Cigarettes  . Smokeless tobacco: Never Used  Substance Use Topics  . Alcohol use: No  . Drug use: No      A shared decision-making session was conducted prior to the performance of CT scan. This includes one or more decision aids, includes benefits and harms of screening, follow-up diagnostic testing, over-diagnosis, false positive rate, and total radiation exposure.   Counseling on the importance of adherence to annual lung cancer LDCT screening, impact of co-morbidities, and ability or willingness to undergo diagnosis and treatment is imperative for compliance of the program.   Counseling on the importance of continued smoking cessation for former smokers; the importance of smoking cessation for current smokers, and information about tobacco cessation interventions have been given to patient including [place] and [facility] quit [place] programs.   Written order for lung cancer screening with LDCT has been given to the patient and any and all questions have been answered to the best of my abilities.    Yearly follow up will be coordinated by Burgess Estelle, Thoracic Navigator.  I provided 15 minutes of face-to-face video visit time during this encounter, and > 50% was spent counseling as documented under my assessment & plan.   Jacquelin Hawking, NP

## 2019-11-04 ENCOUNTER — Telehealth: Payer: Self-pay | Admitting: *Deleted

## 2019-11-04 NOTE — Telephone Encounter (Signed)
Notified patient of LDCT lung cancer screening program results with recommendation for 12 month follow up imaging. Also notified of incidental findings noted below and is encouraged to discuss further with PCP who will receive a copy of this note and/or the CT report. Patient verbalizes understanding.   IMPRESSION: Lung-RADS 2, benign appearance or behavior. Continue annual screening with low-dose chest CT without contrast in 12 months.  Aortic Atherosclerosis (ICD10-I70.0) and Emphysema (ICD10-J43.9).  

## 2019-11-17 DIAGNOSIS — R6889 Other general symptoms and signs: Secondary | ICD-10-CM | POA: Diagnosis not present

## 2019-12-27 ENCOUNTER — Ambulatory Visit (INDEPENDENT_AMBULATORY_CARE_PROVIDER_SITE_OTHER): Payer: Medicare Other

## 2019-12-27 ENCOUNTER — Other Ambulatory Visit: Payer: Self-pay

## 2019-12-27 VITALS — BP 112/72 | HR 65 | Resp 15 | Ht 66.0 in | Wt 128.2 lb

## 2019-12-27 DIAGNOSIS — M8580 Other specified disorders of bone density and structure, unspecified site: Secondary | ICD-10-CM

## 2019-12-27 DIAGNOSIS — Z0000 Encounter for general adult medical examination without abnormal findings: Secondary | ICD-10-CM | POA: Diagnosis not present

## 2019-12-27 DIAGNOSIS — Z1231 Encounter for screening mammogram for malignant neoplasm of breast: Secondary | ICD-10-CM | POA: Diagnosis not present

## 2019-12-27 DIAGNOSIS — Z780 Asymptomatic menopausal state: Secondary | ICD-10-CM

## 2019-12-27 NOTE — Patient Instructions (Signed)
[name] , Thank you for taking time to come for your Medicare Wellness Visit. I appreciate your ongoing commitment to your health goals. Please review the following plan we discussed and let me know if I can assist you in the future.   Screening recommendations/referrals: Colonoscopy: done 08/31/13 Mammogram: done 09/10/17. Please call [phone] to schedule your mammogram and bone density screening.  Bone Density: done 09/10/17 Recommended yearly ophthalmology/optometry visit for glaucoma screening and checkup Recommended yearly dental visit for hygiene and checkup  Vaccinations: Influenza vaccine: done 09/20/19 Pneumococcal vaccine: done 04/08/19 Tdap vaccine: due Shingles vaccine: Shingrix discussed. Please contact your pharmacy for coverage information.     Advanced directives: Advance directive discussed with you today. Even though you declined this today please call our office should you change your mind and we can give you the proper paperwork for you to fill out.  Conditions/risks identified: Recommend increasing physical activity to at least 3 days per week.  Next appointment: Please follow up in one year for your Medicare Annual Wellness visit.     Preventive Care 56 Years and Older, Female Preventive care refers to lifestyle choices and visits with your health care provider that can promote health and wellness. What does preventive care include?  A yearly physical exam. This is also called an annual well check.  Dental exams once or twice a year.  Routine eye exams. Ask your health care provider how often you should have your eyes checked.  Personal lifestyle choices, including:  Daily care of your teeth and gums.  Regular physical activity.  Eating a healthy diet.  Avoiding tobacco and drug use.  Limiting alcohol use.  Practicing safe sex.  Taking low-dose aspirin every day.  Taking vitamin and mineral supplements as recommended by your health care  provider. What happens during an annual well check? The services and screenings done by your health care provider during your annual well check will depend on your age, overall health, lifestyle risk factors, and family history of disease. Counseling  Your health care provider may ask you questions about your:  Alcohol use.  Tobacco use.  Drug use.  Emotional well-being.  Home and relationship well-being.  Sexual activity.  Eating habits.  History of falls.  Memory and ability to understand (cognition).  Work and work Astronomer.  Reproductive health. Screening  You may have the following tests or measurements:  Height, weight, and BMI.  Blood pressure.  Lipid and cholesterol levels. These may be checked every 5 years, or more frequently if you are over 65 years old.  Skin check.  Lung cancer screening. You may have this screening every year starting at age 43 if you have a 30-pack-year history of smoking and currently smoke or have quit within the past 15 years.  Fecal occult blood test (FOBT) of the stool. You may have this test every year starting at age 30.  Flexible sigmoidoscopy or colonoscopy. You may have a sigmoidoscopy every 5 years or a colonoscopy every 10 years starting at age 36.  Hepatitis C blood test.  Hepatitis B blood test.  Sexually transmitted disease (STD) testing.  Diabetes screening. This is done by checking your blood sugar (glucose) after you have not eaten for a while (fasting). You may have this done every 1-3 years.  Bone density scan. This is done to screen for osteoporosis. You may have this done starting at age 72.  Mammogram. This may be done every 1-2 years. Talk to your health care provider about  how often you should have regular mammograms. Talk with your health care provider about your test results, treatment options, and if necessary, the need for more tests. Vaccines  Your health care provider may recommend certain  vaccines, such as:  Influenza vaccine. This is recommended every year.  Tetanus, diphtheria, and acellular pertussis (Tdap, Td) vaccine. You may need a Td booster every 10 years.  Zoster vaccine. You may need this after age 68.  Pneumococcal 13-valent conjugate (PCV13) vaccine. One dose is recommended after age 1.  Pneumococcal polysaccharide (PPSV23) vaccine. One dose is recommended after age 58. Talk to your health care provider about which screenings and vaccines you need and how often you need them. This information is not intended to replace advice given to you by your health care provider. Make sure you discuss any questions you have with your health care provider. Document Released: 12/15/2015 Document Revised: 08/07/2016 Document Reviewed: 09/19/2015 Elsevier Interactive Patient Education  2017 [place] Prevention in the Home Falls can cause injuries. They can happen to people of all ages. There are many things you can do to make your home safe and to help prevent falls. What can I do on the outside of my home?  Regularly fix the edges of walkways and driveways and fix any cracks.  Remove anything that might make you trip as you walk through a door, such as a raised step or threshold.  Trim any bushes or trees on the path to your home.  Use bright outdoor lighting.  Clear any walking paths of anything that might make someone trip, such as rocks or tools.  Regularly check to see if handrails are loose or broken. Make sure that both sides of any steps have handrails.  Any raised decks and porches should have guardrails on the edges.  Have any leaves, snow, or ice cleared regularly.  Use sand or salt on walking paths during winter.  Clean up any spills in your garage right away. This includes oil or grease spills. What can I do in the bathroom?  Use night lights.  Install grab bars by the toilet and in the tub and shower. Do not use towel bars as grab  bars.  Use non-skid mats or decals in the tub or shower.  If you need to sit down in the shower, use a plastic, non-slip stool.  Keep the floor dry. Clean up any water that spills on the floor as soon as it happens.  Remove soap buildup in the tub or shower regularly.  Attach bath mats securely with double-sided non-slip rug tape.  Do not have throw rugs and other things on the floor that can make you trip. What can I do in the bedroom?  Use night lights.  Make sure that you have a light by your bed that is easy to reach.  Do not use any sheets or blankets that are too big for your bed. They should not hang down onto the floor.  Have a firm chair that has side arms. You can use this for support while you get dressed.  Do not have throw rugs and other things on the floor that can make you trip. What can I do in the kitchen?  Clean up any spills right away.  Avoid walking on wet floors.  Keep items that you use a lot in easy-to-reach places.  If you need to reach something above you, use a strong step stool that has a grab bar.  Keep  electrical cords out of the way.  Do not use floor polish or wax that makes floors slippery. If you must use wax, use non-skid floor wax.  Do not have throw rugs and other things on the floor that can make you trip. What can I do with my stairs?  Do not leave any items on the stairs.  Make sure that there are handrails on both sides of the stairs and use them. Fix handrails that are broken or loose. Make sure that handrails are as long as the stairways.  Check any carpeting to make sure that it is firmly attached to the stairs. Fix any carpet that is loose or worn.  Avoid having throw rugs at the top or bottom of the stairs. If you do have throw rugs, attach them to the floor with carpet tape.  Make sure that you have a light switch at the top of the stairs and the bottom of the stairs. If you do not have them, ask someone to add them for  you. What else can I do to help prevent falls?  Wear shoes that:  Do not have high heels.  Have rubber bottoms.  Are comfortable and fit you well.  Are closed at the toe. Do not wear sandals.  If you use a stepladder:  Make sure that it is fully opened. Do not climb a closed stepladder.  Make sure that both sides of the stepladder are locked into place.  Ask someone to hold it for you, if possible.  Clearly mark and make sure that you can see:  Any grab bars or handrails.  First and last steps.  Where the edge of each step is.  Use tools that help you move around (mobility aids) if they are needed. These include:  Canes.  Walkers.  Scooters.  Crutches.  Turn on the lights when you go into a dark area. Replace any light bulbs as soon as they burn out.  Set up your furniture so you have a clear path. Avoid moving your furniture around.  If any of your floors are uneven, fix them.  If there are any pets around you, be aware of where they are.  Review your medicines with your doctor. Some medicines can make you feel dizzy. This can increase your chance of falling. Ask your doctor what other things that you can do to help prevent falls. This information is not intended to replace advice given to you by your health care provider. Make sure you discuss any questions you have with your health care provider. Document Released: 09/14/2009 Document Revised: 04/25/2016 Document Reviewed: 12/23/2014 Elsevier Interactive Patient Education  2017 [employer].

## 2019-12-27 NOTE — Progress Notes (Signed)
Subjective:   Marilyn Molina is a 69 y.o. female who presents for an Initial Medicare Annual Wellness Visit.  Review of Systems      Cardiac Risk Factors include: advanced age (>76men, >13 women);smoking/ tobacco exposure;dyslipidemia;hypertension     Objective:    Today's Vitals   12/27/19 0856  BP: 112/72  Pulse: 65  Resp: 15  SpO2: 98%  Weight: 128 lb 3.2 oz (58.2 kg)  Height: 5\' 6"  (1.676 m)   Body mass index is 20.69 kg/m.  Advanced Directives 12/27/2019 07/11/2015  Does Patient Have a Medical Advance Directive? No No  Would patient like information on creating a medical advance directive? No - Patient declined No - patient declined information    Current Medications (verified) Outpatient Encounter Medications as of 12/27/2019  Medication Sig  . atorvastatin (LIPITOR) 10 MG tablet Take 1 tablet (10 mg total) by mouth daily.  . carvedilol (COREG) 3.125 MG tablet Take 1 tablet (3.125 mg total) by mouth 2 (two) times daily with a meal.  . cholecalciferol (VITAMIN D3) 25 MCG (1000 UNIT) tablet Take 1,000 Units by mouth daily.  12/29/2019 losartan (COZAAR) 50 MG tablet Take 1 tablet (50 mg total) by mouth daily at 6 (six) AM.  . montelukast (SINGULAIR) 10 MG tablet Take 1 tablet (10 mg total) by mouth daily.  . Multiple Vitamin (MULTI-VITAMIN) tablet Take 1 tablet by mouth daily.  . sertraline (ZOLOFT) 100 MG tablet Take 1 tablet (100 mg total) by mouth daily.  . vitamin B-12 (CYANOCOBALAMIN) 1000 MCG tablet Take 1,000 mcg by mouth daily.   No facility-administered encounter medications on file as of 12/27/2019.    Allergies (verified) Patient has no known allergies.   History: Past Medical History:  Diagnosis Date  . Anxiety   . 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 High cholesterol   . Hypertension    Past Surgical History:  Procedure Laterality Date  . OVARIAN CYST REMOVAL     Family History  Problem Relation Age of Onset  . Supraventricular tachycardia  Mother   . Stroke Father    Social History   Socioeconomic History  . Marital status: Widowed    Spouse name: Not on file  . Number of children: 2  . Years of education: Not on file  . Highest education level: Not on file  Occupational History    Comment: retired  Tobacco Use  . Smoking status: Current Every Day Smoker    Packs/day: 1.00    Years: 47.00    Pack years: 47.00    Types: Cigarettes  . Smokeless tobacco: Never Used  Substance and Sexual Activity  . Alcohol use: No  . Drug use: No  . Sexual activity: Never  Other Topics Concern  . Not on file  Social History Narrative  . Not on file   Social Determinants of Health   Financial Resource Strain: Low Risk   . Difficulty of Paying Living Expenses: Not hard at all  Food Insecurity: No Food Insecurity  . Worried About 12/29/2019 in the Last Year: Never true  . Ran Out of Food in the Last Year: Never true  Transportation Needs: No Transportation Needs  . Lack of Transportation (Medical): No  . Lack of Transportation (Non-Medical): No  Physical Activity: Inactive  . Days of Exercise per Week: 0 days  . Minutes of Exercise per Session: 0 min  Stress: No Stress Concern Present  . Feeling of Stress :  Only a little  Social Connections: Unknown  . Frequency of Communication with Friends and Family: Patient refused  . Frequency of Social Gatherings with Friends and Family: Patient refused  . Attends Religious Services: Patient refused  . Active Member of Clubs or Organizations: Patient refused  . Attends Archivist Meetings: Patient refused  . Marital Status: Widowed    Tobacco Counseling Ready to quit: No Counseling given: Not Answered   Clinical Intake:  Pre-visit preparation completed: Yes  Pain : No/denies pain     BMI - recorded: 20.69 Nutritional Status: BMI of 19-24  Normal Nutritional Risks: None Diabetes: No  How often do you need to have someone help you when you read  instructions, pamphlets, or other written materials from your doctor or pharmacy?: 1 - Never  Interpreter Needed?: No  Information entered by :: Clemetine Marker LPN   Activities of Daily Living In your present state of health, do you have any difficulty performing the following activities: 12/27/2019  Hearing? N  Comment declines hearing aids  Vision? N  Difficulty concentrating or making decisions? N  Walking or climbing stairs? N  Dressing or bathing? N  Doing errands, shopping? N  Preparing Food and eating ? N  Using the Toilet? N  In the past six months, have you accidently leaked urine? N  Do you have problems with loss of bowel control? N  Managing your Medications? N  Managing your Finances? N  Housekeeping or managing your Housekeeping? N  Some recent data might be hidden     Immunizations and Health Maintenance Immunization History  Administered Date(s) Administered  . Influenza, High Dose Seasonal PF 10/08/2018  . Influenza, Quadrivalent, Recombinant, Inj, Pf 09/20/2019  . Influenza, Seasonal, Injecte, Preservative Fre 11/22/2014  . Influenza,inj,Quad PF,6+ Mos 11/21/2015, 08/20/2017  . Influenza-Unspecified 09/02/2013, 08/20/2017, 08/03/2019  . Pneumococcal Conjugate-13 01/01/2018  . Pneumococcal Polysaccharide-23 04/08/2019   There are no preventive care reminders to display for this patient.  Patient Care Team: Juline Patch, MD as PCP - General (Family Medicine)  Indicate any recent Medical Services you may have received from other than [facility] providers in the past year (date may be approximate).     Assessment:   This is a routine wellness examination for Marilyn Molina.  Hearing/Vision screen  Hearing Screening   125Hz  250Hz  500Hz  1000Hz  2000Hz  3000Hz  4000Hz  6000Hz  8000Hz   Right ear:           Left ear:           Comments: Pt declines hearing difficulty  Vision Screening Comments: Annual vision screenings with Dr. Mallie Mussel  Dietary issues and exercise  activities discussed: Current Exercise Habits: The patient does not participate in regular exercise at present, Exercise limited by: None identified  Goals    . Increase physical activity     Recommend increasing physical activity to at least 3 days per week.       Depression Screen PHQ 2/9 Scores 12/27/2019 10/15/2019 08/20/2019 04/08/2019 10/08/2018 08/20/2017  PHQ - 2 Score 0 0 0 2 0 0  PHQ- 9 Score - 0 0 4 0 -    Fall Risk Fall Risk  12/27/2019 10/08/2018 08/20/2017  Falls in the past year? 0 1 No  Number falls in past yr: 0 0 -  Injury with Fall? 0 0 -  Risk for fall due to : No Fall Risks - -  Follow up Falls prevention discussed Falls evaluation completed -    [place]  TO THE HOME:  Any stairs in or around the home? Yes  If so, do they handrails? Yes   Home free of loose throw rugs in walkways, pet beds, electrical cords, etc? Yes  Adequate lighting in your home to reduce risk of falls? Yes   ASSISTIVE DEVICES UTILIZED TO PREVENT FALLS:  Life alert? No  Use of a cane, walker or w/c? No  Grab bars in the bathroom? No  Shower chair or bench in shower? Yes  Elevated toilet seat or a handicapped toilet? No   DME ORDERS:  DME order needed?  No   TIMED UP AND GO:  Was the test performed? Yes .  Length of time to ambulate 10 feet: 5 sec.   GAIT:  Appearance of gait: Gait stead-fast and without the use of an assistive device.   Education: Fall risk prevention has been discussed.  Intervention(s) required? No   Cognitive Function:     6CIT Screen 12/27/2019  What Year? 0 points  What month? 0 points  What time? 0 points  Count back from 20 0 points  Months in reverse 0 points  Repeat phrase 0 points  Total Score 0    Screening Tests Health Maintenance  Topic Date Due  . MAMMOGRAM  10/14/2020 (Originally 09/11/2019)  . TETANUS/TDAP  10/14/2020 (Originally [birth year]/1971)  . COLONOSCOPY  09/01/2023  . INFLUENZA VACCINE  Completed  . DEXA  SCAN  Completed  . Hepatitis C Screening  Completed  . PNA vac Low Risk Adult  Completed     Qualifies for Shingles Vaccine?  Yes . Due for Shingrix. Education has been provided regarding the importance of this vaccine. Pt has been advised to call insurance company to determine out of pocket expense. Advised may also receive vaccine at local pharmacy or Health Dept. Verbalized acceptance and understanding.  Tdap: Although this vaccine is not a covered service during a Wellness Exam, does the patient still wish to receive this vaccine today?  No .  Education has been provided regarding the importance of this vaccine. Advised may receive this vaccine at local pharmacy or Health Dept. Aware to provide a copy of the vaccination record if obtained from local pharmacy or Health Dept. Verbalized acceptance and understanding.  Flu Vaccine: Up to date  Pneumococcal Vaccine: Up to date   Cancer Screenings:  Colorectal Screening: Completed 08/31/13. Repeat every 10 years;   Mammogram: Completed 09/10/17. Repeat every year. Ordered today. Pt provided with contact information and advised to call to schedule appt.   Bone Density: Completed 09/10/17. Results reflect OSTEOPENIA. Repeat every 2 years. Ordered today. Pt provided with contact information and advised to call to schedule appt.   Lung Cancer Screening: (Low Dose CT Chest recommended if Age 24-80 years, 30 pack-year currently smoking OR have quit w/in 15years.) does qualify. Completed 11/03/19.    Additional Screening:  Hepatitis C Screening: does qualify; Completed 01/01/18  Vision Screening: Recommended annual ophthalmology exams for early detection of glaucoma and other disorders of the eye. Is the patient up to date with their annual eye exam?  Yes  Who is the provider or what is the name of the office in which the pt attends annual eye exams? Dr. Dewaine Conger  Dental Screening: Recommended annual dental exams for proper oral  hygiene  Community Resource Referral:  CRR required this visit?  No      Plan:    I have personally reviewed and addressed the Medicare Annual Wellness questionnaire and have noted the  following in the patient's chart:  A. Medical and social history B. Use of alcohol, tobacco or illicit drugs  C. Current medications and supplements D. Functional ability and status E.  Nutritional status F.  Physical activity G. Advance directives H. List of other physicians I.  Hospitalizations, surgeries, and ER visits in previous 12 months J.  Vitals K. Screenings such as hearing and vision if needed, cognitive and depression L. Referrals and appointments   In addition, I have reviewed and discussed with patient certain preventive protocols, quality metrics, and best practice recommendations. A written personalized care plan for preventive services as well as general preventive health recommendations were provided to patient.   Signed,    Reather Littler, LPN Nurse Health Advisor   Nurse Notes: pt doing well and appreciative of visit today

## 2020-01-04 ENCOUNTER — Other Ambulatory Visit: Payer: Self-pay

## 2020-01-04 ENCOUNTER — Ambulatory Visit (INDEPENDENT_AMBULATORY_CARE_PROVIDER_SITE_OTHER): Payer: Medicare Other | Admitting: Family Medicine

## 2020-01-04 ENCOUNTER — Encounter: Payer: Self-pay | Admitting: Family Medicine

## 2020-01-04 VITALS — BP 120/80 | HR 80 | Ht 66.0 in | Wt 132.0 lb

## 2020-01-04 DIAGNOSIS — Z0000 Encounter for general adult medical examination without abnormal findings: Secondary | ICD-10-CM

## 2020-01-04 DIAGNOSIS — J4521 Mild intermittent asthma with (acute) exacerbation: Secondary | ICD-10-CM

## 2020-01-04 DIAGNOSIS — J432 Centrilobular emphysema: Secondary | ICD-10-CM

## 2020-01-04 DIAGNOSIS — F324 Major depressive disorder, single episode, in partial remission: Secondary | ICD-10-CM | POA: Diagnosis not present

## 2020-01-04 DIAGNOSIS — I700 Atherosclerosis of aorta: Secondary | ICD-10-CM

## 2020-01-04 DIAGNOSIS — I10 Essential (primary) hypertension: Secondary | ICD-10-CM | POA: Diagnosis not present

## 2020-01-04 DIAGNOSIS — F17210 Nicotine dependence, cigarettes, uncomplicated: Secondary | ICD-10-CM | POA: Diagnosis not present

## 2020-01-04 DIAGNOSIS — E785 Hyperlipidemia, unspecified: Secondary | ICD-10-CM

## 2020-01-04 LAB — POCT URINALYSIS DIPSTICK
Bilirubin, UA: NEGATIVE
Blood, UA: NEGATIVE
Glucose, UA: NEGATIVE
Ketones, UA: NEGATIVE
Leukocytes, UA: NEGATIVE
Nitrite, UA: NEGATIVE
Protein, UA: POSITIVE — AB
Spec Grav, UA: 1.02 (ref 1.010–1.025)
Urobilinogen, UA: 0.2 E.U./dL
pH, UA: 5 (ref 5.0–8.0)

## 2020-01-04 MED ORDER — LOSARTAN POTASSIUM 50 MG PO TABS: 50.0000 mg | ORAL_TABLET | Freq: Every day | ORAL | 1 refills | Status: DC

## 2020-01-04 MED ORDER — SERTRALINE HCL 100 MG PO TABS: 100.0000 mg | ORAL_TABLET | Freq: Every day | ORAL | 1 refills | Status: DC

## 2020-01-04 MED ORDER — CARVEDILOL 3.125 MG PO TABS: 3.1250 mg | ORAL_TABLET | Freq: Two times a day (BID) | ORAL | 1 refills | Status: DC

## 2020-01-04 MED ORDER — ATORVASTATIN CALCIUM 10 MG PO TABS: 10.0000 mg | ORAL_TABLET | Freq: Every day | ORAL | 1 refills | Status: DC

## 2020-01-04 MED ORDER — MONTELUKAST SODIUM 10 MG PO TABS: 10.0000 mg | ORAL_TABLET | Freq: Every day | ORAL | 1 refills | Status: DC

## 2020-01-04 NOTE — Progress Notes (Signed)
Date:  01/04/2020   Name:  Marilyn Molina   DOB:  03/30/1951   MRN:  [id number]   Chief Complaint: Annual Exam  Patient is a 69 year old female who presents for a comprehensive physical exam. The patient reports the following problems: none. Health maintenance has been reviewed mammogram.   Lab Results  Component Value Date   CREATININE 0.82 10/15/2019   BUN 17 10/15/2019   NA 142 10/15/2019   K 4.6 10/15/2019   CL 105 10/15/2019   CO2 21 10/15/2019   Lab Results  Component Value Date   CHOL 177 04/08/2019   HDL 62 04/08/2019   LDLCALC 93 04/08/2019   TRIG 111 04/08/2019   CHOLHDL 2.9 10/08/2018   No results found for: TSH No results found for: HGBA1C   Review of Systems  Constitutional: Negative.  Negative for chills, fatigue, fever and unexpected weight change.  HENT: Negative for congestion, ear discharge, ear pain, postnasal drip, rhinorrhea, sinus pressure, sinus pain, sneezing, sore throat, tinnitus, trouble swallowing and voice change.   Eyes: Negative for photophobia, pain, discharge, redness and itching.  Respiratory: Negative for cough, shortness of breath, wheezing and stridor.   Cardiovascular: Negative for chest pain, palpitations and leg swelling.  Gastrointestinal: Negative for abdominal pain, anal bleeding, blood in stool, constipation, diarrhea, nausea and vomiting.  Endocrine: Negative for cold intolerance, heat intolerance, polydipsia, polyphagia and polyuria.  Genitourinary: Negative for dysuria, flank pain, frequency, hematuria, menstrual problem, pelvic pain, urgency, vaginal bleeding and vaginal discharge.  Musculoskeletal: Negative for arthralgias, back pain and myalgias.  Skin: Negative for rash.  Allergic/Immunologic: Negative for environmental allergies and food allergies.  Neurological: Negative for dizziness, weakness, light-headedness, numbness and headaches.  Hematological: Negative for adenopathy. Does not bruise/bleed easily.    Psychiatric/Behavioral: Negative for confusion and dysphoric mood. The patient is not nervous/anxious.     Patient Active Problem List   Diagnosis Date Noted  . Anxiety 11/21/2015  . Essential hypertension 11/21/2015  . Anxiety and depression 11/03/2012  . Cardiomyopathy (HCC) 10/29/2011  . Cigarette nicotine dependence without complication 10/29/2011  . Tobacco abuse 10/29/2011    No Known Allergies  Past Surgical History:  Procedure Laterality Date  . OVARIAN CYST REMOVAL      Social History   Tobacco Use  . Smoking status: Current Every Day Smoker    Packs/day: 1.00    Years: 47.00    Pack years: 47.00    Types: Cigarettes  . Smokeless tobacco: Never Used  Substance Use Topics  . Alcohol use: No  . Drug use: No     Medication list has been reviewed and updated.  Current Meds  Medication Sig  . atorvastatin (LIPITOR) 10 MG tablet Take 1 tablet (10 mg total) by mouth daily.  . carvedilol (COREG) 3.125 MG tablet Take 1 tablet (3.125 mg total) by mouth 2 (two) times daily with a meal.  . cholecalciferol (VITAMIN D3) 25 MCG (1000 UNIT) tablet Take 1,000 Units by mouth daily.  Marland Kitchen losartan (COZAAR) 50 MG tablet Take 1 tablet (50 mg total) by mouth daily at 6 (six) AM.  . montelukast (SINGULAIR) 10 MG tablet Take 1 tablet (10 mg total) by mouth daily.  . Multiple Vitamin (MULTI-VITAMIN) tablet Take 1 tablet by mouth daily.  . sertraline (ZOLOFT) 100 MG tablet Take 1 tablet (100 mg total) by mouth daily.  . vitamin B-12 (CYANOCOBALAMIN) 1000 MCG tablet Take 1,000 mcg by mouth daily.    PHQ 2/9 Scores 01/04/2020  12/27/2019 10/15/2019 08/20/2019  PHQ - 2 Score 0 0 0 0  PHQ- 9 Score 0 - 0 0    BP Readings from Last 3 Encounters:  01/04/20 120/80  12/27/19 112/72  10/15/19 124/62    Physical Exam Vitals and nursing note reviewed.  Constitutional:      General: She is not in acute distress.    Appearance: Normal appearance. She is not diaphoretic.  HENT:     Head:  Normocephalic and atraumatic.     Jaw: There is normal jaw occlusion.     Right Ear: Hearing, tympanic membrane, ear canal and external ear normal. There is no impacted cerumen.     Left Ear: Hearing, tympanic membrane, ear canal and external ear normal. There is no impacted cerumen.     Nose: Nose normal. No congestion or rhinorrhea.     Mouth/Throat:     Lips: Pink.     Mouth: Mucous membranes are moist.     Dentition: Normal dentition.     Palate: No mass.     Pharynx: Oropharynx is clear. No pharyngeal swelling, oropharyngeal exudate or posterior oropharyngeal erythema.  Eyes:     General: Lids are normal. Vision grossly intact. Gaze aligned appropriately.        Right eye: No discharge.        Left eye: No discharge.     Extraocular Movements: Extraocular movements intact.     Conjunctiva/sclera: Conjunctivae normal.     Pupils: Pupils are equal, round, and reactive to light.     Funduscopic exam:    Right eye: Red reflex present.        Left eye: Red reflex present. Neck:     Thyroid: No thyroid mass, thyromegaly or thyroid tenderness.     Vascular: Normal carotid pulses. No carotid bruit, hepatojugular reflux or JVD.     Trachea: Trachea and phonation normal.  Cardiovascular:     Rate and Rhythm: Normal rate and regular rhythm.     Chest Wall: PMI is not displaced.     Pulses: Normal pulses.          Carotid pulses are 2+ on the right side and 2+ on the left side.      Radial pulses are 2+ on the right side and 2+ on the left side.       Femoral pulses are 2+ on the right side and 2+ on the left side.      Popliteal pulses are 2+ on the right side and 2+ on the left side.       Dorsalis pedis pulses are 2+ on the right side and 2+ on the left side.       Posterior tibial pulses are 2+ on the right side and 2+ on the left side.     Heart sounds: Normal heart sounds, S1 normal and S2 normal. No murmur. No systolic murmur. No diastolic murmur. No friction rub. No gallop.     Pulmonary:     Effort: Pulmonary effort is normal. No respiratory distress.     Breath sounds: Normal breath sounds. No decreased air movement. No decreased breath sounds, wheezing, rhonchi or rales.  Chest:     Chest wall: No tenderness.     Breasts: Breasts are symmetrical.        Right: Normal. No swelling, bleeding, inverted nipple, mass, nipple discharge, skin change or tenderness.        Left: Normal. No swelling, bleeding, inverted nipple, mass, nipple discharge,  skin change or tenderness.  Abdominal:     General: Abdomen is flat. Bowel sounds are normal. There is no distension or abdominal bruit. There are no signs of injury.     Palpations: Abdomen is soft. There is no shifting dullness, hepatomegaly, splenomegaly or mass.     Tenderness: There is no abdominal tenderness. There is no right CVA tenderness, left CVA tenderness, guarding or rebound.     Hernia: No hernia is present.  Musculoskeletal:        General: Normal range of motion.     Cervical back: Normal range of motion and neck supple.     Right lower leg: No edema.     Left lower leg: No edema.  Lymphadenopathy:     Cervical: No cervical adenopathy.     Right cervical: No superficial, deep or posterior cervical adenopathy.    Left cervical: No superficial, deep or posterior cervical adenopathy.     Upper Body:     Right upper body: No supraclavicular, axillary or pectoral adenopathy.     Left upper body: No supraclavicular, axillary or pectoral adenopathy.  Skin:    General: Skin is warm and dry.     Capillary Refill: Capillary refill takes less than 2 seconds.     Coloration: Skin is not ashen or cyanotic.  Neurological:     Mental Status: She is alert and oriented to person, place, and time. Mental status is at baseline.     Cranial Nerves: Cranial nerves are intact.     Sensory: Sensation is intact.     Motor: Motor function is intact.     Deep Tendon Reflexes: Reflexes are normal and symmetric.     Reflex  Scores:      Tricep reflexes are 2+ on the right side and 2+ on the left side.      Bicep reflexes are 2+ on the right side and 2+ on the left side.      Patellar reflexes are 2+ on the right side and 2+ on the left side. Psychiatric:        Behavior: Behavior is cooperative.     Wt Readings from Last 3 Encounters:  01/04/20 132 lb (59.9 kg)  12/27/19 128 lb 3.2 oz (58.2 kg)  11/03/19 135 lb (61.2 kg)    BP 120/80   Pulse 80   Ht 5\' 6"  (1.676 m)   Wt 132 lb (59.9 kg)   BMI 21.31 kg/m   Assessment and Plan:  1. Annual physical exam No subjective/objective concerns noted on history and physical exam.  Previous encounters, most recent labs, most recent imaging and care elsewhere was reviewed.  Urinalysis dipstick was done.Marilyn Molina is a 69 y.o. female who presents today for her Complete Annual Exam. She feels well. She reports exercising . She reports she is sleeping well. Immunizations are reviewed and recommendations provided.   Age appropriate screening tests are discussed. Counseling given for risk factor reduction interventions. - POCT urinalysis dipstick  2. Major depressive disorder in partial remission, unspecified whether recurrent (HCC) Chronic.  Controlled.  Stable.  PHQ 0 gad score 0 continue sertraline 100 mg once a day. - sertraline (ZOLOFT) 100 MG tablet; Take 1 tablet (100 mg total) by mouth daily.  Dispense: 90 tablet; Refill: 1  3. Hyperlipidemia, unspecified hyperlipidemia type Chronic.  Controlled.  Stable.  Continue atorvastatin 10 mg once a day.  Will check lipid panel. - atorvastatin (LIPITOR) 10 MG tablet; Take 1 tablet (10 mg total)  by mouth daily.  Dispense: 90 tablet; Refill: 1 - Lipid Panel With LDL/HDL Ratio  4. Essential hypertension Chronic.  Controlled.  Stable.  Continue losartan 50 mg once a day. - losartan (COZAAR) 50 MG tablet; Take 1 tablet (50 mg total) by mouth daily at 6 (six) AM.  Dispense: 90 tablet; Refill: 1 - carvedilol  (COREG) 3.125 MG tablet; Take 1 tablet (3.125 mg total) by mouth 2 (two) times daily with a meal.  Dispense: 180 tablet; Refill: 1 - POCT urinalysis dipstick  5. Mild intermittent asthma with acute exacerbation Chronic.  Controlled.  Stable.  Occasional exacerbation.  We will continue Singulair 10 mg once a day. - montelukast (SINGULAIR) 10 MG tablet; Take 1 tablet (10 mg total) by mouth daily.  Dispense: 90 tablet; Refill: 1  6. Cigarette nicotine dependence without complication .Patient has been advised of the health risks of smoking and counseled concerning cessation of tobacco products. I spent over 3 minutes for discussion and to answer questions. - POCT urinalysis dipstick  7. Aortic atherosclerosis ([place]) On imaging reviewed patient is noted to have aortic atherosclerosis and has been encouraged to continue low-dose aspirin 81 mg daily.  8. Centrilobular emphysema ([place]) Patient has been encouraged to discontinue smoking as well as to continue her albuterol on a as needed basis and during concerning weather circumstances.

## 2020-01-04 NOTE — Patient Instructions (Signed)

## 2020-01-05 ENCOUNTER — Other Ambulatory Visit: Payer: Self-pay

## 2020-01-05 ENCOUNTER — Encounter: Payer: Self-pay | Admitting: Family Medicine

## 2020-01-05 ENCOUNTER — Ambulatory Visit
Admission: RE | Admit: 2020-01-05 | Discharge: 2020-01-05 | Disposition: A | Discharge status: Home / Self Care | Payer: Medicare Other | Source: Ambulatory Visit | Attending: Family Medicine | Admitting: Family Medicine

## 2020-01-05 ENCOUNTER — Other Ambulatory Visit: Payer: Self-pay | Admitting: Family Medicine

## 2020-01-05 DIAGNOSIS — Z780 Asymptomatic menopausal state: Secondary | ICD-10-CM | POA: Diagnosis not present

## 2020-01-05 DIAGNOSIS — R928 Other abnormal and inconclusive findings on diagnostic imaging of breast: Secondary | ICD-10-CM

## 2020-01-05 DIAGNOSIS — Z1231 Encounter for screening mammogram for malignant neoplasm of breast: Secondary | ICD-10-CM

## 2020-01-05 DIAGNOSIS — M8580 Other specified disorders of bone density and structure, unspecified site: Secondary | ICD-10-CM | POA: Diagnosis not present

## 2020-01-05 DIAGNOSIS — Z13820 Encounter for screening for osteoporosis: Secondary | ICD-10-CM | POA: Insufficient documentation

## 2020-01-05 DIAGNOSIS — M810 Age-related osteoporosis without current pathological fracture: Secondary | ICD-10-CM | POA: Diagnosis not present

## 2020-01-05 LAB — LIPID PANEL WITH LDL/HDL RATIO
Cholesterol, Total: 184 mg/dL (ref 100–199)
HDL: 67 mg/dL (ref >39)
LDL Chol Calc (NIH): 89 mg/dL (ref 0–99)
LDL/HDL Ratio: 1.3 ratio (ref 0.0–3.2)
Triglycerides: 166 mg/dL — ABNORMAL HIGH (ref 0–149)
VLDL Cholesterol Cal: 28 mg/dL (ref 5–40)

## 2020-01-12 ENCOUNTER — Ambulatory Visit
Admission: RE | Admit: 2020-01-12 | Discharge: 2020-01-12 | Disposition: A | Discharge status: Home / Self Care | Payer: Medicare Other | Source: Ambulatory Visit | Attending: Family Medicine | Admitting: Family Medicine

## 2020-01-12 DIAGNOSIS — R928 Other abnormal and inconclusive findings on diagnostic imaging of breast: Secondary | ICD-10-CM | POA: Diagnosis not present

## 2020-01-12 DIAGNOSIS — R922 Inconclusive mammogram: Secondary | ICD-10-CM | POA: Diagnosis not present

## 2020-04-17 ENCOUNTER — Ambulatory Visit (INDEPENDENT_AMBULATORY_CARE_PROVIDER_SITE_OTHER): Payer: Medicare Other | Admitting: Family Medicine

## 2020-04-17 ENCOUNTER — Other Ambulatory Visit: Payer: Self-pay

## 2020-04-17 ENCOUNTER — Encounter: Payer: Self-pay | Admitting: Family Medicine

## 2020-04-17 VITALS — BP 130/70 | HR 80 | Ht 66.0 in | Wt 131.0 lb

## 2020-04-17 DIAGNOSIS — J0100 Acute maxillary sinusitis, unspecified: Secondary | ICD-10-CM

## 2020-04-17 MED ORDER — AMOXICILLIN 500 MG PO CAPS: 500.0000 mg | ORAL_CAPSULE | Freq: Three times a day (TID) | ORAL | 0 refills | Status: DC

## 2020-04-17 NOTE — Progress Notes (Signed)
Date:  04/17/2020   Name:  Marilyn Molina   DOB:  1951-08-30   MRN:  [id number]   Chief Complaint: Sinusitis (feels pain/ pressure in face across cheeks, no fever, yellow production)  Sinusitis This is a new problem. The current episode started in the past 7 days. The problem has been gradually improving since onset. There has been no fever. The pain is mild. Associated symptoms include congestion, headaches and sinus pressure. Pertinent negatives include no chills, coughing, diaphoresis, ear pain, hoarse voice, neck pain, shortness of breath, sneezing, sore throat or swollen glands. (Yellow productive drainage) Past treatments include acetaminophen. The treatment provided moderate relief.    Lab Results  Component Value Date   CREATININE 0.82 10/15/2019   BUN 17 10/15/2019   NA 142 10/15/2019   K 4.6 10/15/2019   CL 105 10/15/2019   CO2 21 10/15/2019   Lab Results  Component Value Date   CHOL 184 01/04/2020   HDL 67 01/04/2020   LDLCALC 89 01/04/2020   TRIG 166 (H) 01/04/2020   CHOLHDL 2.9 10/08/2018   No results found for: TSH No results found for: HGBA1C Lab Results  Component Value Date   WBC 13.7 (H) 07/11/2015   HGB 13.4 07/11/2015   HCT 39.5 07/11/2015   MCV 91 07/11/2015   PLT 257 07/11/2015   Lab Results  Component Value Date   ALT 14 10/15/2019   AST 12 10/15/2019   ALKPHOS 72 10/15/2019   BILITOT 0.3 10/15/2019     Review of Systems  Constitutional: Negative.  Negative for chills, diaphoresis, fatigue, fever and unexpected weight change.  HENT: Positive for congestion and sinus pressure. Negative for ear discharge, ear pain, hoarse voice, postnasal drip, rhinorrhea, sneezing and sore throat.   Eyes: Negative for photophobia, pain, discharge, redness and itching.  Respiratory: Negative for cough, shortness of breath, wheezing and stridor.   Cardiovascular: Negative for chest pain, palpitations and leg swelling.  Gastrointestinal: Negative for  abdominal pain, blood in stool, constipation, diarrhea, nausea and vomiting.  Endocrine: Negative for cold intolerance, heat intolerance, polydipsia, polyphagia and polyuria.  Genitourinary: Negative for dysuria, flank pain, frequency, hematuria, menstrual problem, pelvic pain, urgency, vaginal bleeding and vaginal discharge.  Musculoskeletal: Negative for arthralgias, back pain, myalgias and neck pain.  Skin: Negative for rash.  Allergic/Immunologic: Negative for environmental allergies and food allergies.  Neurological: Positive for headaches. Negative for dizziness, weakness, light-headedness and numbness.  Hematological: Negative for adenopathy. Does not bruise/bleed easily.  Psychiatric/Behavioral: Negative for dysphoric mood. The patient is not nervous/anxious.     Patient Active Problem List   Diagnosis Date Noted  . Anxiety 11/21/2015  . Essential hypertension 11/21/2015  . Anxiety and depression 11/03/2012  . Cardiomyopathy ([place]) 10/29/2011  . Cigarette nicotine dependence without complication 67/11/4579  . Tobacco abuse 10/29/2011    No Known Allergies  Past Surgical History:  Procedure Laterality Date  . OVARIAN CYST REMOVAL      Social History   Tobacco Use  . Smoking status: Current Every Day Smoker    Packs/day: 1.00    Years: 47.00    Pack years: 47.00    Types: Cigarettes  . Smokeless tobacco: Never Used  Substance Use Topics  . Alcohol use: No  . Drug use: No     Medication list has been reviewed and updated.  Current Meds  Medication Sig  . atorvastatin (LIPITOR) 10 MG tablet Take 1 tablet (10 mg total) by mouth daily.  . Calcium Carbonate-Vit  D-Min (CALCIUM 1200 PO) Take 1 tablet by mouth daily.  . carvedilol (COREG) 3.125 MG tablet Take 1 tablet (3.125 mg total) by mouth 2 (two) times daily with a meal.  . cholecalciferol (VITAMIN D3) 25 MCG (1000 UNIT) tablet Take 1,000 Units by mouth daily.  Marland Kitchen losartan (COZAAR) 50 MG tablet Take 1 tablet (50 mg  total) by mouth daily at 6 (six) AM.  . montelukast (SINGULAIR) 10 MG tablet Take 1 tablet (10 mg total) by mouth daily.  . Multiple Vitamin (MULTI-VITAMIN) tablet Take 1 tablet by mouth daily.  . sertraline (ZOLOFT) 100 MG tablet Take 1 tablet (100 mg total) by mouth daily.  . vitamin B-12 (CYANOCOBALAMIN) 1000 MCG tablet Take 1,000 mcg by mouth daily.    PHQ 2/9 Scores 04/17/2020 01/04/2020 12/27/2019 10/15/2019  PHQ - 2 Score 0 0 0 0  PHQ- 9 Score 0 0 - 0    BP Readings from Last 3 Encounters:  04/17/20 130/70  01/04/20 120/80  12/27/19 112/72    Physical Exam Vitals and nursing note reviewed.  Constitutional:      General: She is not in acute distress.    Appearance: She is not diaphoretic.  HENT:     Head: Normocephalic and atraumatic.     Right Ear: Hearing, tympanic membrane, ear canal and external ear normal.     Left Ear: Hearing, tympanic membrane, ear canal and external ear normal.     Nose: No congestion or rhinorrhea.     Right Sinus: Maxillary sinus tenderness present. No frontal sinus tenderness.     Left Sinus: Maxillary sinus tenderness present. No frontal sinus tenderness.  Eyes:     General:        Right eye: No discharge.        Left eye: No discharge.     Conjunctiva/sclera: Conjunctivae normal.     Pupils: Pupils are equal, round, and reactive to light.  Neck:     Thyroid: No thyromegaly.     Vascular: No carotid bruit or JVD.  Cardiovascular:     Rate and Rhythm: Normal rate and regular rhythm.     Heart sounds: Normal heart sounds. No murmur. No friction rub. No gallop.   Pulmonary:     Effort: Pulmonary effort is normal. No respiratory distress.     Breath sounds: Normal breath sounds. No stridor. No wheezing, rhonchi or rales.  Chest:     Chest wall: No tenderness.  Abdominal:     General: Bowel sounds are normal.     Palpations: Abdomen is soft. There is no mass.     Tenderness: There is no abdominal tenderness. There is no guarding or rebound.   Musculoskeletal:        General: Normal range of motion.     Cervical back: Normal range of motion and neck supple. No rigidity or tenderness.  Lymphadenopathy:     Cervical: No cervical adenopathy.  Skin:    General: Skin is warm and dry.     Capillary Refill: Capillary refill takes less than 2 seconds.  Neurological:     Mental Status: She is alert.     Deep Tendon Reflexes: Reflexes are normal and symmetric.     Wt Readings from Last 3 Encounters:  04/17/20 131 lb (59.4 kg)  01/04/20 132 lb (59.9 kg)  12/27/19 128 lb 3.2 oz (58.2 kg)    BP 130/70   Pulse 80   Ht 5\' 6"  (1.676 m)   Wt 131 lb (59.4  kg)   BMI 21.14 kg/m   Assessment and Plan: 1. Acute maxillary sinusitis, recurrence not specified Acute.  Persistent.  Relatively controlled but worsening over the pollen season.  Patient has a productive nasal discharge that is purulent without fever.  Will initiate amoxicillin 500 mg 3 times a day for 10 days.

## 2020-07-27 DIAGNOSIS — L570 Actinic keratosis: Secondary | ICD-10-CM | POA: Diagnosis not present

## 2020-09-01 ENCOUNTER — Other Ambulatory Visit: Payer: Self-pay | Admitting: Family Medicine

## 2020-09-01 DIAGNOSIS — I10 Essential (primary) hypertension: Secondary | ICD-10-CM

## 2020-09-01 DIAGNOSIS — E785 Hyperlipidemia, unspecified: Secondary | ICD-10-CM

## 2020-09-01 NOTE — Telephone Encounter (Signed)
Requested  medications are  due for refill today yes  Requested medications are on the active medication list yes  Last refill 7/1  Last visit Feb 2021  Future visit scheduled Jan 2022  Notes to clinic Failed protocol of visit within 6 months

## 2020-09-05 ENCOUNTER — Other Ambulatory Visit: Payer: Self-pay | Admitting: Family Medicine

## 2020-09-05 DIAGNOSIS — F324 Major depressive disorder, single episode, in partial remission: Secondary | ICD-10-CM

## 2020-09-05 DIAGNOSIS — J4521 Mild intermittent asthma with (acute) exacerbation: Secondary | ICD-10-CM

## 2020-09-05 NOTE — Telephone Encounter (Signed)
Requested Prescriptions  Pending Prescriptions Disp Refills   sertraline (ZOLOFT) 100 MG tablet [Pharmacy Med Name: SERTRALINE HCL 100 MG TABLET] 90 tablet 1    Sig: TAKE 1 TABLET BY MOUTH EVERY DAY     Psychiatry:  Antidepressants - SSRI Passed - 09/05/2020  1:24 AM      Passed - Completed PHQ-2 or PHQ-9 in the last 360 days.      Passed - Valid encounter within last 6 months    Recent Outpatient Visits          4 months ago Acute maxillary sinusitis, recurrence not specified   [facility] Duanne Limerick, MD   8 months ago Annual physical exam   [facility] Duanne Limerick, MD   10 months ago Essential hypertension   [facility] Duanne Limerick, MD   1 year ago Acute non-recurrent maxillary sinusitis   [facility] Duanne Limerick, MD   1 year ago Essential hypertension   [facility] Duanne Limerick, MD      Future Appointments            Tomorrow Duanne Limerick, MD [facility]            montelukast (SINGULAIR) 10 MG tablet [Pharmacy Med Name: MONTELUKAST SOD 10 MG TABLET] 90 tablet 1    Sig: TAKE 1 TABLET BY MOUTH EVERY DAY     Pulmonology:  Leukotriene Inhibitors Passed - 09/05/2020  1:24 AM      Passed - Valid encounter within last 12 months    Recent Outpatient Visits          4 months ago Acute maxillary sinusitis, recurrence not specified   [facility] Duanne Limerick, MD   8 months ago Annual physical exam   [facility] Duanne Limerick, MD   10 months ago Essential hypertension   [facility] Duanne Limerick, MD   1 year ago Acute non-recurrent maxillary sinusitis   [facility] Duanne Limerick, MD   1 year ago Essential hypertension   [facility] Duanne Limerick, MD      Future Appointments            Tomorrow Duanne Limerick, MD [facility]

## 2020-09-06 ENCOUNTER — Other Ambulatory Visit: Payer: Self-pay

## 2020-09-06 ENCOUNTER — Ambulatory Visit (INDEPENDENT_AMBULATORY_CARE_PROVIDER_SITE_OTHER): Payer: Medicare Other | Admitting: Family Medicine

## 2020-09-06 ENCOUNTER — Encounter: Payer: Self-pay | Admitting: Family Medicine

## 2020-09-06 VITALS — BP 120/60 | HR 72 | Ht 66.0 in | Wt 130.0 lb

## 2020-09-06 DIAGNOSIS — J0100 Acute maxillary sinusitis, unspecified: Secondary | ICD-10-CM

## 2020-09-06 DIAGNOSIS — I10 Essential (primary) hypertension: Secondary | ICD-10-CM

## 2020-09-06 DIAGNOSIS — E785 Hyperlipidemia, unspecified: Secondary | ICD-10-CM | POA: Diagnosis not present

## 2020-09-06 DIAGNOSIS — F324 Major depressive disorder, single episode, in partial remission: Secondary | ICD-10-CM | POA: Diagnosis not present

## 2020-09-06 DIAGNOSIS — R059 Cough, unspecified: Secondary | ICD-10-CM

## 2020-09-06 DIAGNOSIS — Z23 Encounter for immunization: Secondary | ICD-10-CM

## 2020-09-06 DIAGNOSIS — J4521 Mild intermittent asthma with (acute) exacerbation: Secondary | ICD-10-CM

## 2020-09-06 MED ORDER — LOSARTAN POTASSIUM 50 MG PO TABS: 50.0000 mg | ORAL_TABLET | Freq: Every day | ORAL | 1 refills | Status: DC

## 2020-09-06 MED ORDER — ATORVASTATIN CALCIUM 10 MG PO TABS: 10.0000 mg | ORAL_TABLET | Freq: Every day | ORAL | 1 refills | Status: DC

## 2020-09-06 MED ORDER — CARVEDILOL 3.125 MG PO TABS: 3.1250 mg | ORAL_TABLET | Freq: Two times a day (BID) | ORAL | 1 refills | Status: DC

## 2020-09-06 MED ORDER — MONTELUKAST SODIUM 10 MG PO TABS: 10.0000 mg | ORAL_TABLET | Freq: Every day | ORAL | 2 refills | Status: DC

## 2020-09-06 MED ORDER — GUAIFENESIN-CODEINE 100-10 MG/5ML PO SYRP: 5.0000 mL | ORAL_SOLUTION | Freq: Three times a day (TID) | ORAL | 0 refills | Status: DC | PRN

## 2020-09-06 MED ORDER — AMOXICILLIN 500 MG PO CAPS: 500.0000 mg | ORAL_CAPSULE | Freq: Three times a day (TID) | ORAL | 0 refills | Status: DC

## 2020-09-06 MED ORDER — SERTRALINE HCL 100 MG PO TABS: 100.0000 mg | ORAL_TABLET | Freq: Every day | ORAL | 1 refills | Status: DC

## 2020-09-06 NOTE — Progress Notes (Signed)
Date:  09/06/2020   Name:  Marilyn Molina   DOB:  1951/03/18   MRN:  [id number]   Chief Complaint: Hypertension, Hyperlipidemia, Depression, and Sinusitis  Hypertension This is a chronic problem. The current episode started more than 1 year ago. The problem has been gradually worsening since onset. The problem is controlled. Pertinent negatives include no anxiety, blurred vision, chest pain, headaches, malaise/fatigue, neck pain, orthopnea, palpitations, peripheral edema, PND, shortness of breath or sweats. There are no associated agents to hypertension. There are no known risk factors for coronary artery disease. Past treatments include beta blockers, angiotensin blockers and calcium channel blockers. The current treatment provides moderate improvement. Compliance problems include medication side effects.  There is no history of angina, kidney disease, CAD/MI, CVA, heart failure, left ventricular hypertrophy, PVD or retinopathy. There is no history of chronic renal disease, a hypertension causing med or renovascular disease.  Hyperlipidemia This is a chronic problem. The current episode started more than 1 year ago. The problem is controlled. She has no history of chronic renal disease, diabetes, hypothyroidism, liver disease, obesity or nephrotic syndrome. Pertinent negatives include no chest pain, focal sensory loss, focal weakness, leg pain, myalgias or shortness of breath. Current antihyperlipidemic treatment includes statins. The current treatment provides moderate improvement of lipids. There are no compliance problems.  There are no known risk factors for coronary artery disease.  Depression        This is a chronic problem.  The current episode started more than 1 year ago.   The onset quality is gradual.   The problem has been gradually improving since onset.  Associated symptoms include no decreased concentration, no fatigue, no helplessness, no hopelessness, does not have insomnia,  not irritable, no restlessness, no decreased interest, no appetite change, no body aches, no myalgias, no headaches, no indigestion, not sad and no suicidal ideas.     The symptoms are aggravated by nothing.  Past treatments include SSRIs - Selective serotonin reuptake inhibitors.  Compliance with treatment is variable.  Previous treatment provided mild relief.   Pertinent negatives include no hypothyroidism and no anxiety. Sinusitis This is a new problem. The current episode started in the past 7 days. The problem has been gradually worsening since onset. There has been no fever. The pain is mild. Associated symptoms include congestion, coughing and sinus pressure. Pertinent negatives include no chills, diaphoresis, ear pain, headaches, hoarse voice, neck pain, shortness of breath, sneezing, sore throat or swollen glands. Past treatments include acetaminophen. The treatment provided moderate relief.    Lab Results  Component Value Date   CREATININE 0.82 10/15/2019   BUN 17 10/15/2019   NA 142 10/15/2019   K 4.6 10/15/2019   CL 105 10/15/2019   CO2 21 10/15/2019   Lab Results  Component Value Date   CHOL 184 01/04/2020   HDL 67 01/04/2020   LDLCALC 89 01/04/2020   TRIG 166 (H) 01/04/2020   CHOLHDL 2.9 10/08/2018   No results found for: TSH No results found for: HGBA1C Lab Results  Component Value Date   WBC 13.7 (H) 07/11/2015   HGB 13.4 07/11/2015   HCT 39.5 07/11/2015   MCV 91 07/11/2015   PLT 257 07/11/2015   Lab Results  Component Value Date   ALT 14 10/15/2019   AST 12 10/15/2019   ALKPHOS 72 10/15/2019   BILITOT 0.3 10/15/2019     Review of Systems  Constitutional: Negative.  Negative for appetite change, chills, diaphoresis, fatigue, fever,  malaise/fatigue and unexpected weight change.  HENT: Positive for congestion and sinus pressure. Negative for ear discharge, ear pain, hoarse voice, rhinorrhea, sneezing and sore throat.   Eyes: Negative for blurred vision,  photophobia, pain, discharge, redness and itching.  Respiratory: Positive for cough. Negative for shortness of breath, wheezing and stridor.   Cardiovascular: Negative for chest pain, palpitations, orthopnea and PND.  Gastrointestinal: Negative for abdominal pain, blood in stool, constipation, diarrhea, nausea and vomiting.  Endocrine: Negative for cold intolerance, heat intolerance, polydipsia, polyphagia and polyuria.  Genitourinary: Negative for dysuria, flank pain, frequency, hematuria, menstrual problem, pelvic pain, urgency, vaginal bleeding and vaginal discharge.  Musculoskeletal: Negative for arthralgias, back pain, myalgias and neck pain.  Skin: Negative for rash.  Allergic/Immunologic: Negative for environmental allergies and food allergies.  Neurological: Negative for dizziness, focal weakness, weakness, light-headedness, numbness and headaches.  Hematological: Negative for adenopathy. Does not bruise/bleed easily.  Psychiatric/Behavioral: Positive for depression. Negative for decreased concentration, dysphoric mood and suicidal ideas. The patient is not nervous/anxious and does not have insomnia.     Patient Active Problem List   Diagnosis Date Noted  . Anxiety 11/21/2015  . Essential hypertension 11/21/2015  . Anxiety and depression 11/03/2012  . Cardiomyopathy (HCC) 10/29/2011  . Cigarette nicotine dependence without complication 10/29/2011  . Tobacco abuse 10/29/2011    No Known Allergies  Past Surgical History:  Procedure Laterality Date  . OVARIAN CYST REMOVAL      Social History   Tobacco Use  . Smoking status: Current Every Day Smoker    Packs/day: 1.00    Years: 47.00    Pack years: 47.00    Types: Cigarettes  . Smokeless tobacco: Never Used  Vaping Use  . Vaping Use: Never used  Substance Use Topics  . Alcohol use: No  . Drug use: No     Medication list has been reviewed and updated.  Current Meds  Medication Sig  . atorvastatin (LIPITOR) 10 MG  tablet TAKE 1 TABLET BY MOUTH EVERY DAY  . Calcium Carbonate-Vit D-Min (CALCIUM 1200 PO) Take 1 tablet by mouth daily.  . carvedilol (COREG) 3.125 MG tablet Take 1 tablet (3.125 mg total) by mouth 2 (two) times daily with a meal.  . cholecalciferol (VITAMIN D3) 25 MCG (1000 UNIT) tablet Take 1,000 Units by mouth daily.  Marland Kitchen losartan (COZAAR) 50 MG tablet TAKE 1 TABLET (50 MG TOTAL) BY MOUTH DAILY AT 6 (SIX) AM.  . montelukast (SINGULAIR) 10 MG tablet TAKE 1 TABLET BY MOUTH EVERY DAY  . Multiple Vitamin (MULTI-VITAMIN) tablet Take 1 tablet by mouth daily.  . sertraline (ZOLOFT) 100 MG tablet TAKE 1 TABLET BY MOUTH EVERY DAY  . vitamin B-12 (CYANOCOBALAMIN) 1000 MCG tablet Take 1,000 mcg by mouth daily.    PHQ 2/9 Scores 09/06/2020 04/17/2020 01/04/2020 12/27/2019  PHQ - 2 Score 0 0 0 0  PHQ- 9 Score 0 0 0 -    GAD 7 : Generalized Anxiety Score 09/06/2020 04/17/2020 01/04/2020 10/15/2019  Nervous, Anxious, on Edge 0 0 0 0  Control/stop worrying 0 0 0 0  Worry too much - different things 0 0 0 0  Trouble relaxing 0 0 0 0  Restless 0 0 0 0  Easily annoyed or irritable 0 0 0 0  Afraid - awful might happen 0 0 0 0  Total GAD 7 Score 0 0 0 0    BP Readings from Last 3 Encounters:  09/06/20 120/60  04/17/20 130/70  01/04/20 120/80  Physical Exam Vitals and nursing note reviewed.  Constitutional:      General: She is not irritable.    Appearance: She is well-developed.  HENT:     Head: Normocephalic.     Right Ear: Tympanic membrane, ear canal and external ear normal.     Left Ear: Tympanic membrane, ear canal and external ear normal.     Nose:     Right Sinus: Maxillary sinus tenderness present. No frontal sinus tenderness.     Left Sinus: Maxillary sinus tenderness present. No frontal sinus tenderness.  Eyes:     General: Lids are everted, no foreign bodies appreciated. No scleral icterus.       Left eye: No foreign body or hordeolum.     Conjunctiva/sclera: Conjunctivae normal.      Right eye: Right conjunctiva is not injected.     Left eye: Left conjunctiva is not injected.     Pupils: Pupils are equal, round, and reactive to light.  Neck:     Thyroid: No thyromegaly.     Vascular: No JVD.     Trachea: No tracheal deviation.  Cardiovascular:     Rate and Rhythm: Normal rate and regular rhythm.     Heart sounds: Normal heart sounds. No murmur heard.  No friction rub. No gallop.   Pulmonary:     Effort: Pulmonary effort is normal. No respiratory distress.     Breath sounds: Normal breath sounds. No wheezing, rhonchi or rales.  Abdominal:     General: Bowel sounds are normal.     Palpations: Abdomen is soft. There is no mass.     Tenderness: There is no abdominal tenderness. There is no guarding or rebound.  Musculoskeletal:        General: No tenderness. Normal range of motion.     Cervical back: Normal range of motion and neck supple.  Lymphadenopathy:     Cervical: No cervical adenopathy.  Skin:    General: Skin is warm.     Findings: No rash.  Neurological:     Mental Status: She is alert and oriented to person, place, and time.     Cranial Nerves: No cranial nerve deficit.     Deep Tendon Reflexes: Reflexes normal.  Psychiatric:        Mood and Affect: Mood is not anxious or depressed.     Wt Readings from Last 3 Encounters:  09/06/20 130 lb (59 kg)  04/17/20 131 lb (59.4 kg)  01/04/20 132 lb (59.9 kg)    BP 120/60   Pulse 72   Ht 5\' 6"  (1.676 m)   Wt 130 lb (59 kg)   BMI 20.98 kg/m   Assessment and Plan: 1. Essential hypertension Chronic.  Controlled.  Stable.  Continue carvedilol 3.1251 twice a day and losartan 50 mg once a day.  Will check CMP for electrolytes and GFR. - Comprehensive metabolic panel - carvedilol (COREG) 3.125 MG tablet; Take 1 tablet (3.125 mg total) by mouth 2 (two) times daily with a meal.  Dispense: 180 tablet; Refill: 1 - losartan (COZAAR) 50 MG tablet; Take 1 tablet (50 mg total) by mouth daily at 6 (six) AM.   Dispense: 90 tablet; Refill: 1  2. Acute maxillary sinusitis, recurrence not specified Acute.  Persistent.  Relatively stable.  Will initiate amoxicillin 500 mg 3 times a day. - amoxicillin (AMOXIL) 500 MG capsule; Take 1 capsule (500 mg total) by mouth 3 (three) times daily.  Dispense: 30 capsule; Refill: 0  3.  Hyperlipidemia, unspecified hyperlipidemia type Chronic.  Controlled.  Stable.  Continue atorvastatin 10 mg once a day. - atorvastatin (LIPITOR) 10 MG tablet; Take 1 tablet (10 mg total) by mouth daily.  Dispense: 90 tablet; Refill: 1  4. Major depressive disorder in partial remission, unspecified whether recurrent (HCC) Chronic.  Controlled.  Stable.  PHQ 0.  Continue sertraline 100 mg daily. - sertraline (ZOLOFT) 100 MG tablet; Take 1 tablet (100 mg total) by mouth daily.  Dispense: 90 tablet; Refill: 1  5. Mild intermittent asthma with acute exacerbation Chronic.  Episodic.  Mild.  Intermittent.  Continue Singulair 10 mg once a day. - montelukast (SINGULAIR) 10 MG tablet; Take 1 tablet (10 mg total) by mouth daily.  Dispense: 90 tablet; Refill: 2  6. Cough New onset.  Particularly nocturnal of concern.  Will take Robitussin-AC 1 teaspoon every 6 hours as needed cough. - guaiFENesin-codeine (ROBITUSSIN AC) 100-10 MG/5ML syrup; Take 5 mLs by mouth 3 (three) times daily as needed.  Dispense: 100 mL; Refill: 0  7. Need for immunization against influenza Discussed and administered. - Flu Vaccine QUAD High Dose(Fluad)

## 2020-09-07 LAB — COMPREHENSIVE METABOLIC PANEL
ALT: 13 IU/L (ref 0–32)
AST: 12 IU/L (ref 0–40)
Albumin/Globulin Ratio: 1.8 (ref 1.2–2.2)
Albumin: 4.2 g/dL (ref 3.8–4.8)
Alkaline Phosphatase: 69 IU/L (ref 44–121)
BUN/Creatinine Ratio: 24 (ref 12–28)
BUN: 17 mg/dL (ref 8–27)
Bilirubin Total: 0.3 mg/dL (ref 0.0–1.2)
CO2: 23 mmol/L (ref 20–29)
Calcium: 9.5 mg/dL (ref 8.7–10.3)
Chloride: 105 mmol/L (ref 96–106)
Creatinine, Ser: 0.72 mg/dL (ref 0.57–1.00)
GFR calc Af Amer: 100 mL/min/{1.73_m2} (ref >59)
GFR calc non Af Amer: 86 mL/min/{1.73_m2} (ref >59)
Globulin, Total: 2.4 g/dL (ref 1.5–4.5)
Glucose: 91 mg/dL (ref 65–99)
Potassium: 4.2 mmol/L (ref 3.5–5.2)
Sodium: 142 mmol/L (ref 134–144)
Total Protein: 6.6 g/dL (ref 6.0–8.5)

## 2020-10-25 ENCOUNTER — Telehealth: Payer: Self-pay | Admitting: *Deleted

## 2020-10-25 NOTE — Telephone Encounter (Signed)
Attempted to contact and schedule lung screening scan. Message left for patient to call back to schedule. 

## 2020-12-04 ENCOUNTER — Telehealth: Payer: Self-pay | Admitting: *Deleted

## 2020-12-04 ENCOUNTER — Encounter: Payer: Self-pay | Admitting: *Deleted

## 2020-12-04 NOTE — Telephone Encounter (Signed)
Attempted to contact and schedule lung screening scan. Message left for patient to call back to schedule. 

## 2021-01-01 ENCOUNTER — Ambulatory Visit (INDEPENDENT_AMBULATORY_CARE_PROVIDER_SITE_OTHER): Payer: Medicare Other

## 2021-01-01 DIAGNOSIS — Z1231 Encounter for screening mammogram for malignant neoplasm of breast: Secondary | ICD-10-CM | POA: Diagnosis not present

## 2021-01-01 DIAGNOSIS — Z0000 Encounter for general adult medical examination without abnormal findings: Secondary | ICD-10-CM | POA: Diagnosis not present

## 2021-01-01 NOTE — Progress Notes (Signed)
Subjective:   Marilyn Molina is a 70 y.o. female who presents for Medicare Annual (Subsequent) preventive examination.  Virtual Visit via Video Note  I connected with  Marilyn Molina on 01/01/21 at  8:40 AM EST via telehealth video enabled device and verified that I am speaking with the correct person using two identifiers.  Location: Patient: home Provider: [facility] Persons participating in the virtual visit: patient/Nurse Health Advisor   I discussed the limitations, risks, security and privacy concerns of performing an evaluation and management service by telephone and the availability of in person appointments. The patient expressed understanding and agreed to proceed.  Some vital signs may be absent or patient reported.   Reather Littler, LPN    Review of Systems     Cardiac Risk Factors include: advanced age (>10men, >29 women);smoking/ tobacco exposure;dyslipidemia;hypertension     Objective:    There were no vitals filed for this visit. There is no height or weight on file to calculate BMI.  Advanced Directives 01/01/2021 12/27/2019 07/11/2015  Does Patient Have a Medical Advance Directive? Yes No No  Type of Estate agent of [place];Living will - -  Copy of Healthcare Power of Attorney in Chart? No - copy requested - -  Would patient like information on creating a medical advance directive? - No - Patient declined No - patient declined information    Current Medications (verified) Outpatient Encounter Medications as of 01/01/2021  Medication Sig  . atorvastatin (LIPITOR) 10 MG tablet Take 1 tablet (10 mg total) by mouth daily.  . Calcium Carbonate-Vit D-Min (CALCIUM 1200 PO) Take 1 tablet by mouth daily.  . carvedilol (COREG) 3.125 MG tablet Take 1 tablet (3.125 mg total) by mouth 2 (two) times daily with a meal.  . cholecalciferol (VITAMIN D3) 25 MCG (1000 UNIT) tablet Take 1,000 Units by mouth daily.  Marland Kitchen losartan (COZAAR) 50 MG tablet Take 1  tablet (50 mg total) by mouth daily at 6 (six) AM.  . montelukast (SINGULAIR) 10 MG tablet Take 1 tablet (10 mg total) by mouth daily.  . Multiple Vitamin (MULTI-VITAMIN) tablet Take 1 tablet by mouth daily.  . sertraline (ZOLOFT) 100 MG tablet Take 1 tablet (100 mg total) by mouth daily.  . vitamin B-12 (CYANOCOBALAMIN) 1000 MCG tablet Take 1,000 mcg by mouth daily.  . [DISCONTINUED] amoxicillin (AMOXIL) 500 MG capsule Take 1 capsule (500 mg total) by mouth 3 (three) times daily.  . [DISCONTINUED] guaiFENesin-codeine (ROBITUSSIN AC) 100-10 MG/5ML syrup Take 5 mLs by mouth 3 (three) times daily as needed.   No facility-administered encounter medications on file as of 01/01/2021.    Allergies (verified) Patient has no known allergies.   History: Past Medical History:  Diagnosis Date  . Anxiety   . 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 High cholesterol   . Hypertension    Past Surgical History:  Procedure Laterality Date  . OVARIAN CYST REMOVAL     Family History  Problem Relation Age of Onset  . Supraventricular tachycardia Mother   . Stroke Father   . Breast cancer Neg Hx    Social History   Socioeconomic History  . Marital status: Widowed    Spouse name: Not on file  . Number of children: 2  . Years of education: Not on file  . Highest education level: Not on file  Occupational History    Comment: retired  Tobacco Use  . Smoking status: Current Every Day Smoker  Packs/day: 1.00    Years: 47.00    Pack years: 47.00    Types: Cigarettes  . Smokeless tobacco: Never Used  Vaping Use  . Vaping Use: Never used  Substance and Sexual Activity  . Alcohol use: No  . Drug use: No  . Sexual activity: Never  Other Topics Concern  . Not on file  Social History Narrative   Pt's son and granddaughter live with her.    Social Determinants of Health   Financial Resource Strain: Low Risk   . Difficulty of Paying Living Expenses: Not hard at all  Food Insecurity: No  Food Insecurity  . Worried About Programme researcher, broadcasting/film/video in the Last Year: Never true  . Ran Out of Food in the Last Year: Never true  Transportation Needs: No Transportation Needs  . Lack of Transportation (Medical): No  . Lack of Transportation (Non-Medical): No  Physical Activity: Inactive  . Days of Exercise per Week: 0 days  . Minutes of Exercise per Session: 0 min  Stress: No Stress Concern Present  . Feeling of Stress : Not at all  Social Connections: Socially Isolated  . Frequency of Communication with Friends and Family: More than three times a week  . Frequency of Social Gatherings with Friends and Family: More than three times a week  . Attends Religious Services: Never  . Active Member of Clubs or Organizations: No  . Attends Banker Meetings: Never  . Marital Status: Widowed    Tobacco Counseling Ready to quit: No Counseling given: Not Answered   Clinical Intake:  Pre-visit preparation completed: Yes  Pain : No/denies pain     Nutritional Risks: None Diabetes: No  How often do you need to have someone help you when you read instructions, pamphlets, or other written materials from your doctor or pharmacy?: 1 - Never    Interpreter Needed?: No  Information entered by :: Reather Littler LPN   Activities of Daily Living In your present state of health, do you have any difficulty performing the following activities: 01/01/2021  Hearing? N  Comment declines hearing aids  Vision? N  Difficulty concentrating or making decisions? N  Walking or climbing stairs? N  Dressing or bathing? N  Doing errands, shopping? N  Preparing Food and eating ? N  Using the Toilet? N  In the past six months, have you accidently leaked urine? N  Do you have problems with loss of bowel control? N  Managing your Medications? N  Managing your Finances? N  Housekeeping or managing your Housekeeping? N  Some recent data might be hidden    Patient Care Team: Duanne Limerick, MD as PCP - General (Family Medicine)  Indicate any recent Medical Services you may have received from other than [facility] providers in the past year (date may be approximate).     Assessment:   This is a routine wellness examination for Marilyn Molina.  Hearing/Vision screen  Hearing Screening   125Hz  250Hz  500Hz  1000Hz  2000Hz  3000Hz  4000Hz  6000Hz  8000Hz   Right ear:           Left ear:           Comments: Pt declines hearing difficulty  Vision Screening Comments: Annual vision screenings with Dr. Dewaine Conger  Dietary issues and exercise activities discussed: Current Exercise Habits: The patient does not participate in regular exercise at present, Exercise limited by: None identified  Goals    . Increase physical activity     Recommend increasing  physical activity to at least 3 days per week.       Depression Screen PHQ 2/9 Scores 01/01/2021 09/06/2020 04/17/2020 01/04/2020 12/27/2019 10/15/2019 08/20/2019  PHQ - 2 Score 0 0 0 0 0 0 0  PHQ- 9 Score - 0 0 0 - 0 0    Fall Risk Fall Risk  01/01/2021 09/06/2020 04/17/2020 12/27/2019 10/08/2018  Falls in the past year? 0 0 0 0 1  Number falls in past yr: 0 - - 0 0  Injury with Fall? 0 - - 0 0  Risk for fall due to : No Fall Risks - - No Fall Risks -  Follow up Falls prevention discussed Falls evaluation completed Falls evaluation completed Falls prevention discussed Falls evaluation completed    FALL RISK PREVENTION PERTAINING TO THE HOME:  Any stairs in or around the home? Yes  If so, are there any without handrails? No  Home free of loose throw rugs in walkways, pet beds, electrical cords, etc? Yes  Adequate lighting in your home to reduce risk of falls? Yes   ASSISTIVE DEVICES UTILIZED TO PREVENT FALLS:  Life alert? No  Use of a cane, walker or w/c? No  Grab bars in the bathroom? No  Shower chair or bench in shower? Yes  Elevated toilet seat or a handicapped toilet? No   TIMED UP AND GO:  Was the test performed? No . Telephonic  visit.   Cognitive Function: Normal cognitive status assessed by direct observation by this Nurse Health Advisor. No abnormalities found.       6CIT Screen 12/27/2019  What Year? 0 points  What month? 0 points  What time? 0 points  Count back from 20 0 points  Months in reverse 0 points  Repeat phrase 0 points  Total Score 0    Immunizations Immunization History  Administered Date(s) Administered  . Fluad Quad(high Dose 65+) 09/06/2020  . Influenza, High Dose Seasonal PF 10/08/2018  . Influenza, Quadrivalent, Recombinant, Inj, Pf 09/20/2019  . Influenza, Seasonal, Injecte, Preservative Fre 11/22/2014  . Influenza,inj,Quad PF,6+ Mos 11/21/2015, 08/20/2017  . Influenza-Unspecified 09/02/2013, 08/20/2017, 08/03/2019  . PFIZER(Purple Top)SARS-COV-2 Vaccination 12/30/2019, 01/25/2020, 10/03/2020  . Pneumococcal Conjugate-13 01/01/2018  . Pneumococcal Polysaccharide-23 04/08/2019    TDAP status: Due, Education has been provided regarding the importance of this vaccine. Advised may receive this vaccine at local pharmacy or Health Dept. Aware to provide a copy of the vaccination record if obtained from local pharmacy or Health Dept. Verbalized acceptance and understanding.  Flu Vaccine status: Up to date  Pneumococcal vaccine status: Up to date  Covid-19 vaccine status: Completed vaccines  Qualifies for Shingles Vaccine? Yes   Zostavax completed No   Shingrix Completed?: No.    Education has been provided regarding the importance of this vaccine. Patient has been advised to call insurance company to determine out of pocket expense if they have not yet received this vaccine. Advised may also receive vaccine at local pharmacy or Health Dept. Verbalized acceptance and understanding.  Screening Tests Health Maintenance  Topic Date Due  . TETANUS/TDAP  Never done  . MAMMOGRAM  01/04/2022  . COLONOSCOPY (Pts 45-46yrs Insurance coverage will need to be confirmed)  09/01/2023  .  INFLUENZA VACCINE  Completed  . DEXA SCAN  Completed  . COVID-19 Vaccine  Completed  . Hepatitis C Screening  Completed  . PNA vac Low Risk Adult  Completed    Health Maintenance  Health Maintenance Due  Topic Date Due  . TETANUS/TDAP  Never done    Colorectal cancer screening: Type of screening: Colonoscopy. Completed 08/31/13. Repeat every 10 years  Mammogram status: Completed 01/05/20. Repeat every year. Ordered today.   Bone Density status: Completed 01/05/20. Results reflect: Bone density results: OSTEOPOROSIS. Repeat every 2 years.  Lung Cancer Screening: (Low Dose CT Chest recommended if Age 65-80 years, 30 pack-year currently smoking OR have quit w/in 15years.) does qualify. Pt aware due for follow up and has been contacted to schedule; needs to call back to schedule screening.   Additional Screening:  Hepatitis C Screening: does qualify; Completed 01/01/18  Vision Screening: Recommended annual ophthalmology exams for early detection of glaucoma and other disorders of the eye. Is the patient up to date with their annual eye exam?  Yes  Who is the provider or what is the name of the office in which the patient attends annual eye exams? Dr. Dewaine Conger  Dental Screening: Recommended annual dental exams for proper oral hygiene  Community Resource Referral / Chronic Care Management: CRR required this visit?  No   CCM required this visit?  No      Plan:     I have personally reviewed and noted the following in the patient's chart:   . Medical and social history . Use of alcohol, tobacco or illicit drugs  . Current medications and supplements . Functional ability and status . Nutritional status . Physical activity . Advanced directives . List of other physicians . Hospitalizations, surgeries, and ER visits in previous 12 months . Vitals . Screenings to include cognitive, depression, and falls . Referrals and appointments  In addition, I have reviewed and discussed with  patient certain preventive protocols, quality metrics, and best practice recommendations. A written personalized care plan for preventive services as well as general preventive health recommendations were provided to patient.     Reather Littler, LPN   [id number]   Nurse Notes: pt advised due for follow up office visit for med refill and labs. Scheduled for 01/10/21.

## 2021-01-01 NOTE — Patient Instructions (Signed)
[name] , Thank you for taking time to come for your Medicare Wellness Visit. I appreciate your ongoing commitment to your health goals. Please review the following plan we discussed and let me know if I can assist you in the future.   Screening recommendations/referrals: Colonoscopy: done 08/31/13. Repeat in 2024 Mammogram: done 01/05/20. Please call [phone] to schedule your mammogram.  Bone Density:  Done 01/05/20 Recommended yearly ophthalmology/optometry visit for glaucoma screening and checkup Recommended yearly dental visit for hygiene and checkup  Vaccinations: Influenza vaccine: done 09/06/20 Pneumococcal vaccine: done 04/08/19 Tdap vaccine: due Shingles vaccine: Shingrix discussed. Please contact your pharmacy for coverage information.  Covid-19: done 12/30/19, 01/25/20 & 10/03/20  Advanced directives: Please bring a copy of your health care power of attorney and living will to the office at your convenience.  Conditions/risks identified: If you wish to quit smoking, help is available. For free tobacco cessation program offerings call the [facility] at [phone] or Live Well Line at [phone]. You may also visit [email or web address] or email [email or web address] for more information on other programs.   Next appointment: Follow up in one year for your annual wellness visit    Preventive Care 65 Years and Older, Female Preventive care refers to lifestyle choices and visits with your health care provider that can promote health and wellness. What does preventive care include?  A yearly physical exam. This is also called an annual well check.  Dental exams once or twice a year.  Routine eye exams. Ask your health care provider how often you should have your eyes checked.  Personal lifestyle choices, including:  Daily care of your teeth and gums.  Regular physical activity.  Eating a healthy diet.  Avoiding tobacco and drug use.  Limiting  alcohol use.  Practicing safe sex.  Taking low-dose aspirin every day.  Taking vitamin and mineral supplements as recommended by your health care provider. What happens during an annual well check? The services and screenings done by your health care provider during your annual well check will depend on your age, overall health, lifestyle risk factors, and family history of disease. Counseling  Your health care provider may ask you questions about your:  Alcohol use.  Tobacco use.  Drug use.  Emotional well-being.  Home and relationship well-being.  Sexual activity.  Eating habits.  History of falls.  Memory and ability to understand (cognition).  Work and work Astronomer.  Reproductive health. Screening  You may have the following tests or measurements:  Height, weight, and BMI.  Blood pressure.  Lipid and cholesterol levels. These may be checked every 5 years, or more frequently if you are over 19 years old.  Skin check.  Lung cancer screening. You may have this screening every year starting at age 92 if you have a 30-pack-year history of smoking and currently smoke or have quit within the past 15 years.  Fecal occult blood test (FOBT) of the stool. You may have this test every year starting at age 43.  Flexible sigmoidoscopy or colonoscopy. You may have a sigmoidoscopy every 5 years or a colonoscopy every 10 years starting at age 109.  Hepatitis C blood test.  Hepatitis B blood test.  Sexually transmitted disease (STD) testing.  Diabetes screening. This is done by checking your blood sugar (glucose) after you have not eaten for a while (fasting). You may have this done every 1-3 years.  Bone density scan. This is done to screen for osteoporosis. You may have  this done starting at age 64.  Mammogram. This may be done every 1-2 years. Talk to your health care provider about how often you should have regular mammograms. Talk with your health care provider  about your test results, treatment options, and if necessary, the need for more tests. Vaccines  Your health care provider may recommend certain vaccines, such as:  Influenza vaccine. This is recommended every year.  Tetanus, diphtheria, and acellular pertussis (Tdap, Td) vaccine. You may need a Td booster every 10 years.  Zoster vaccine. You may need this after age 48.  Pneumococcal 13-valent conjugate (PCV13) vaccine. One dose is recommended after age 67.  Pneumococcal polysaccharide (PPSV23) vaccine. One dose is recommended after age 99. Talk to your health care provider about which screenings and vaccines you need and how often you need them. This information is not intended to replace advice given to you by your health care provider. Make sure you discuss any questions you have with your health care provider. Document Released: 12/15/2015 Document Revised: 08/07/2016 Document Reviewed: 09/19/2015 Elsevier Interactive Patient Education  2017 [employer].  Fall Prevention in the Home Falls can cause injuries. They can happen to people of all ages. There are many things you can do to make your home safe and to help prevent falls. What can I do on the outside of my home?  Regularly fix the edges of walkways and driveways and fix any cracks.  Remove anything that might make you trip as you walk through a door, such as a raised step or threshold.  Trim any bushes or trees on the path to your home.  Use bright outdoor lighting.  Clear any walking paths of anything that might make someone trip, such as rocks or tools.  Regularly check to see if handrails are loose or broken. Make sure that both sides of any steps have handrails.  Any raised decks and porches should have guardrails on the edges.  Have any leaves, snow, or ice cleared regularly.  Use sand or salt on walking paths during winter.  Clean up any spills in your garage right away. This includes oil or grease  spills. What can I do in the bathroom?  Use night lights.  Install grab bars by the toilet and in the tub and shower. Do not use towel bars as grab bars.  Use non-skid mats or decals in the tub or shower.  If you need to sit down in the shower, use a plastic, non-slip stool.  Keep the floor dry. Clean up any water that spills on the floor as soon as it happens.  Remove soap buildup in the tub or shower regularly.  Attach bath mats securely with double-sided non-slip rug tape.  Do not have throw rugs and other things on the floor that can make you trip. What can I do in the bedroom?  Use night lights.  Make sure that you have a light by your bed that is easy to reach.  Do not use any sheets or blankets that are too big for your bed. They should not hang down onto the floor.  Have a firm chair that has side arms. You can use this for support while you get dressed.  Do not have throw rugs and other things on the floor that can make you trip. What can I do in the kitchen?  Clean up any spills right away.  Avoid walking on wet floors.  Keep items that you use a lot in easy-to-reach  places.  If you need to reach something above you, use a strong step stool that has a grab bar.  Keep electrical cords out of the way.  Do not use floor polish or wax that makes floors slippery. If you must use wax, use non-skid floor wax.  Do not have throw rugs and other things on the floor that can make you trip. What can I do with my stairs?  Do not leave any items on the stairs.  Make sure that there are handrails on both sides of the stairs and use them. Fix handrails that are broken or loose. Make sure that handrails are as long as the stairways.  Check any carpeting to make sure that it is firmly attached to the stairs. Fix any carpet that is loose or worn.  Avoid having throw rugs at the top or bottom of the stairs. If you do have throw rugs, attach them to the floor with carpet  tape.  Make sure that you have a light switch at the top of the stairs and the bottom of the stairs. If you do not have them, ask someone to add them for you. What else can I do to help prevent falls?  Wear shoes that:  Do not have high heels.  Have rubber bottoms.  Are comfortable and fit you well.  Are closed at the toe. Do not wear sandals.  If you use a stepladder:  Make sure that it is fully opened. Do not climb a closed stepladder.  Make sure that both sides of the stepladder are locked into place.  Ask someone to hold it for you, if possible.  Clearly mark and make sure that you can see:  Any grab bars or handrails.  First and last steps.  Where the edge of each step is.  Use tools that help you move around (mobility aids) if they are needed. These include:  Canes.  Walkers.  Scooters.  Crutches.  Turn on the lights when you go into a dark area. Replace any light bulbs as soon as they burn out.  Set up your furniture so you have a clear path. Avoid moving your furniture around.  If any of your floors are uneven, fix them.  If there are any pets around you, be aware of where they are.  Review your medicines with your doctor. Some medicines can make you feel dizzy. This can increase your chance of falling. Ask your doctor what other things that you can do to help prevent falls. This information is not intended to replace advice given to you by your health care provider. Make sure you discuss any questions you have with your health care provider. Document Released: 09/14/2009 Document Revised: 04/25/2016 Document Reviewed: 12/23/2014 Elsevier Interactive Patient Education  2017 [employer].

## 2021-01-08 NOTE — Progress Notes (Signed)
Called and left message with telephone number to [facility] for her to call and schedule appt

## 2021-01-10 ENCOUNTER — Ambulatory Visit (INDEPENDENT_AMBULATORY_CARE_PROVIDER_SITE_OTHER): Payer: Medicare Other | Admitting: Family Medicine

## 2021-01-10 ENCOUNTER — Encounter: Payer: Self-pay | Admitting: Family Medicine

## 2021-01-10 ENCOUNTER — Other Ambulatory Visit: Payer: Self-pay

## 2021-01-10 VITALS — BP 110/62 | HR 80 | Ht 66.0 in | Wt 130.0 lb

## 2021-01-10 DIAGNOSIS — I10 Essential (primary) hypertension: Secondary | ICD-10-CM | POA: Diagnosis not present

## 2021-01-10 DIAGNOSIS — F324 Major depressive disorder, single episode, in partial remission: Secondary | ICD-10-CM | POA: Diagnosis not present

## 2021-01-10 DIAGNOSIS — J4521 Mild intermittent asthma with (acute) exacerbation: Secondary | ICD-10-CM | POA: Diagnosis not present

## 2021-01-10 DIAGNOSIS — E785 Hyperlipidemia, unspecified: Secondary | ICD-10-CM | POA: Diagnosis not present

## 2021-01-10 DIAGNOSIS — R5383 Other fatigue: Secondary | ICD-10-CM

## 2021-01-10 MED ORDER — LOSARTAN POTASSIUM 50 MG PO TABS: 50.0000 mg | ORAL_TABLET | Freq: Every day | ORAL | 1 refills | Status: DC

## 2021-01-10 MED ORDER — CARVEDILOL 3.125 MG PO TABS: 3.1250 mg | ORAL_TABLET | Freq: Two times a day (BID) | ORAL | 1 refills | Status: DC

## 2021-01-10 MED ORDER — MONTELUKAST SODIUM 10 MG PO TABS: 10.0000 mg | ORAL_TABLET | Freq: Every day | ORAL | 1 refills | Status: DC

## 2021-01-10 MED ORDER — ATORVASTATIN CALCIUM 10 MG PO TABS: 10.0000 mg | ORAL_TABLET | Freq: Every day | ORAL | 1 refills | Status: DC

## 2021-01-10 MED ORDER — SERTRALINE HCL 100 MG PO TABS: 100.0000 mg | ORAL_TABLET | Freq: Every day | ORAL | 1 refills | Status: DC

## 2021-01-10 NOTE — Progress Notes (Signed)
Date:  01/10/2021   Name:  Marilyn Molina   DOB:  06/29/1951   MRN:  [id number]   Chief Complaint: Allergic Rhinitis , Hyperlipidemia, Hypertension, and Depression  Hyperlipidemia This is a chronic problem. The current episode started more than 1 year ago. The problem is controlled. Recent lipid tests were reviewed and are normal. She has no history of chronic renal disease, diabetes, hypothyroidism, liver disease, obesity or nephrotic syndrome. There are no known factors aggravating her hyperlipidemia. Pertinent negatives include no chest pain, focal sensory loss, focal weakness, leg pain, myalgias or shortness of breath. Current antihyperlipidemic treatment includes statins. The current treatment provides moderate improvement of lipids. There are no compliance problems.  Risk factors for coronary artery disease include dyslipidemia and hypertension.  Hypertension This is a chronic problem. The current episode started more than 1 year ago. The problem has been waxing and waning since onset. The problem is controlled. Pertinent negatives include no anxiety, blurred vision, chest pain, headaches, malaise/fatigue, orthopnea, palpitations, peripheral edema, PND, shortness of breath or sweats. There are no associated agents to hypertension. Risk factors for coronary artery disease include dyslipidemia. Past treatments include alpha 1 blockers, beta blockers and angiotensin blockers. The current treatment provides moderate improvement. There are no compliance problems.  There is no history of angina, kidney disease, CAD/MI, CVA, heart failure, left ventricular hypertrophy, PVD or retinopathy. Identifiable causes of hypertension include a thyroid problem. There is no history of chronic renal disease, a hypertension causing med or renovascular disease.  Depression        This is a chronic problem.  The current episode started more than 1 year ago.   The onset quality is gradual.   The problem occurs  constantly.  The problem has been gradually improving since onset.  Associated symptoms include no decreased concentration, no fatigue, no helplessness, no hopelessness, does not have insomnia, not irritable, no restlessness, no decreased interest, no appetite change, no body aches, no myalgias, no headaches, no indigestion, not sad and no suicidal ideas.  Past treatments include SSRIs - Selective serotonin reuptake inhibitors.  Compliance with treatment is good.  Past medical history includes thyroid problem.     Pertinent negatives include no hypothyroidism and no anxiety. Thyroid Problem Presents for follow-up visit. Symptoms include heat intolerance and weight loss. Patient reports no anxiety, cold intolerance, constipation, depressed mood, diaphoresis, diarrhea, dry skin, fatigue, hair loss, hoarse voice, leg swelling, menstrual problem, nail problem, palpitations, tremors, visual change or weight gain. The symptoms have been stable. Her past medical history is significant for hyperlipidemia. There is no history of diabetes or heart failure.    Lab Results  Component Value Date   CREATININE 0.72 09/06/2020   BUN 17 09/06/2020   NA 142 09/06/2020   K 4.2 09/06/2020   CL 105 09/06/2020   CO2 23 09/06/2020   Lab Results  Component Value Date   CHOL 184 01/04/2020   HDL 67 01/04/2020   LDLCALC 89 01/04/2020   TRIG 166 (H) 01/04/2020   CHOLHDL 2.9 10/08/2018   No results found for: TSH No results found for: HGBA1C Lab Results  Component Value Date   WBC 13.7 (H) 07/11/2015   HGB 13.4 07/11/2015   HCT 39.5 07/11/2015   MCV 91 07/11/2015   PLT 257 07/11/2015   Lab Results  Component Value Date   ALT 13 09/06/2020   AST 12 09/06/2020   ALKPHOS 69 09/06/2020   BILITOT 0.3 09/06/2020  Review of Systems  Constitutional: Positive for weight loss. Negative for appetite change, chills, diaphoresis, fatigue, fever, malaise/fatigue, unexpected weight change and weight gain.  HENT:  Negative for congestion, ear discharge, ear pain, hoarse voice, rhinorrhea, sinus pressure, sneezing and sore throat.   Eyes: Negative for blurred vision, double vision, photophobia, pain, discharge, redness and itching.  Respiratory: Negative for cough, shortness of breath, wheezing and stridor.   Cardiovascular: Negative for chest pain, palpitations, orthopnea and PND.  Gastrointestinal: Negative for abdominal pain, blood in stool, constipation, diarrhea, nausea and vomiting.  Endocrine: Positive for heat intolerance. Negative for cold intolerance, polydipsia, polyphagia and polyuria.  Genitourinary: Negative for dysuria, flank pain, frequency, hematuria, menstrual problem, pelvic pain, urgency, vaginal bleeding and vaginal discharge.  Musculoskeletal: Negative for arthralgias, back pain and myalgias.  Skin: Negative for rash.  Allergic/Immunologic: Negative for environmental allergies and food allergies.  Neurological: Negative for dizziness, tremors, focal weakness, weakness, light-headedness, numbness and headaches.  Hematological: Negative for adenopathy. Does not bruise/bleed easily.  Psychiatric/Behavioral: Positive for depression. Negative for decreased concentration, dysphoric mood and suicidal ideas. The patient is not nervous/anxious and does not have insomnia.     Patient Active Problem List   Diagnosis Date Noted  . Anxiety 11/21/2015  . Essential hypertension 11/21/2015  . Anxiety and depression 11/03/2012  . Cardiomyopathy (HCC) 10/29/2011  . Cigarette nicotine dependence without complication 10/29/2011  . Tobacco abuse 10/29/2011    No Known Allergies  Past Surgical History:  Procedure Laterality Date  . OVARIAN CYST REMOVAL      Social History   Tobacco Use  . Smoking status: Current Every Day Smoker    Packs/day: 1.00    Years: 47.00    Pack years: 47.00    Types: Cigarettes  . Smokeless tobacco: Never Used  Vaping Use  . Vaping Use: Never used  Substance  Use Topics  . Alcohol use: No  . Drug use: No     Medication list has been reviewed and updated.  Current Meds  Medication Sig  . atorvastatin (LIPITOR) 10 MG tablet Take 1 tablet (10 mg total) by mouth daily.  . Calcium Carbonate-Vit D-Min (CALCIUM 1200 PO) Take 1 tablet by mouth daily.  . carvedilol (COREG) 3.125 MG tablet Take 1 tablet (3.125 mg total) by mouth 2 (two) times daily with a meal.  . cholecalciferol (VITAMIN D3) 25 MCG (1000 UNIT) tablet Take 1,000 Units by mouth daily.  Marland Kitchen losartan (COZAAR) 50 MG tablet Take 1 tablet (50 mg total) by mouth daily at 6 (six) AM.  . montelukast (SINGULAIR) 10 MG tablet Take 1 tablet (10 mg total) by mouth daily.  . Multiple Vitamin (MULTI-VITAMIN) tablet Take 1 tablet by mouth daily.  . sertraline (ZOLOFT) 100 MG tablet Take 1 tablet (100 mg total) by mouth daily.  . vitamin B-12 (CYANOCOBALAMIN) 1000 MCG tablet Take 1,000 mcg by mouth daily.    PHQ 2/9 Scores 01/10/2021 01/01/2021 09/06/2020 04/17/2020  PHQ - 2 Score 0 0 0 0  PHQ- 9 Score 0 - 0 0    GAD 7 : Generalized Anxiety Score 01/10/2021 09/06/2020 04/17/2020 01/04/2020  Nervous, Anxious, on Edge 0 0 0 0  Control/stop worrying 0 0 0 0  Worry too much - different things 0 0 0 0  Trouble relaxing 0 0 0 0  Restless 0 0 0 0  Easily annoyed or irritable 0 0 0 0  Afraid - awful might happen 0 0 0 0  Total GAD 7 Score 0 0  0 0    BP Readings from Last 3 Encounters:  01/10/21 110/62  09/06/20 120/60  04/17/20 130/70    Physical Exam Vitals and nursing note reviewed.  Constitutional:      General: She is not irritable.    Appearance: She is well-developed and well-nourished.  HENT:     Head: Normocephalic.     Right Ear: Tympanic membrane, ear canal and external ear normal. There is no impacted cerumen.     Left Ear: Tympanic membrane, ear canal and external ear normal. There is no impacted cerumen.     Nose: Nose normal.     Mouth/Throat:     Mouth: Oropharynx is clear and moist.  Mucous membranes are moist.  Eyes:     General: Lids are everted, no foreign bodies appreciated. No scleral icterus.       Left eye: No foreign body or hordeolum.     Extraocular Movements: EOM normal.     Conjunctiva/sclera: Conjunctivae normal.     Right eye: Right conjunctiva is not injected.     Left eye: Left conjunctiva is not injected.     Pupils: Pupils are equal, round, and reactive to light.  Neck:     Thyroid: No thyromegaly.     Vascular: No JVD.     Trachea: No tracheal deviation.  Cardiovascular:     Rate and Rhythm: Normal rate and regular rhythm.     Pulses: Intact distal pulses.     Heart sounds: Normal heart sounds. No murmur heard. No friction rub. No gallop.   Pulmonary:     Effort: Pulmonary effort is normal. No respiratory distress.     Breath sounds: Normal breath sounds. No wheezing, rhonchi or rales.  Abdominal:     General: Bowel sounds are normal. There is no distension.     Palpations: Abdomen is soft. There is no hepatosplenomegaly or mass.     Tenderness: There is no abdominal tenderness. There is no right CVA tenderness, left CVA tenderness, guarding or rebound.     Hernia: No hernia is present.  Musculoskeletal:        General: No tenderness or edema. Normal range of motion.     Cervical back: Normal range of motion and neck supple.  Lymphadenopathy:     Cervical: No cervical adenopathy.  Skin:    General: Skin is warm.     Findings: No rash.  Neurological:     General: No focal deficit present.     Mental Status: She is alert and oriented to person, place, and time.     Cranial Nerves: No cranial nerve deficit.     Deep Tendon Reflexes: Strength normal. Reflexes normal.  Psychiatric:        Mood and Affect: Mood and affect normal. Mood is not anxious or depressed.     Wt Readings from Last 3 Encounters:  01/10/21 130 lb (59 kg)  09/06/20 130 lb (59 kg)  04/17/20 131 lb (59.4 kg)    BP 110/62   Pulse 80   Ht 5\' 6"  (1.676 m)   Wt 130  lb (59 kg)   BMI 20.98 kg/m   Assessment and Plan:  1. Essential hypertension Chronic.  Controlled.  Stable.  Blood pressure is excellent at 110/62.  We will continue carvedilol 3.1251 twice a day and losartan 50 mg once a day and we will check renal function panel for electrolytes and GFR. - carvedilol (COREG) 3.125 MG tablet; Take 1 tablet (3.125 mg total)  by mouth 2 (two) times daily with a meal.  Dispense: 180 tablet; Refill: 1 - losartan (COZAAR) 50 MG tablet; Take 1 tablet (50 mg total) by mouth daily at 6 (six) AM.  Dispense: 90 tablet; Refill: 1 - Renal Function Panel  2. Hyperlipidemia, unspecified hyperlipidemia type Chronic.  Controlled.  Stable.  Continue atorvastatin 10 mg once a day.  Will check lipid panel for evaluation. - atorvastatin (LIPITOR) 10 MG tablet; Take 1 tablet (10 mg total) by mouth daily.  Dispense: 90 tablet; Refill: 1 - Lipid Panel With LDL/HDL Ratio  3. Mild intermittent asthma with acute exacerbation Chronic.  Controlled.  Stable.  This is episodic and mild and we will encourage to continue Singulair 10 mg once a day. - montelukast (SINGULAIR) 10 MG tablet; Take 1 tablet (10 mg total) by mouth daily.  Dispense: 90 tablet; Refill: 1  4. Major depressive disorder in partial remission, unspecified whether recurrent (HCC) Chronic.  Controlled.  Stable.  PHQ is 0 Gad score is 0 we will continue Zoloft 100 mg once a day. - sertraline (ZOLOFT) 100 MG tablet; Take 1 tablet (100 mg total) by mouth daily.  Dispense: 90 tablet; Refill: 1  5. Fatigue, unspecified type New onset.  With fatigue in addition to renal function panel we will check a thyroid panel with TSH as well as CBC and hepatic function panel given that she is on statin agent.  As noted patient also has personally noted weight  loss which is not verified by reviewing flowsheets. - Thyroid Panel With TSH - CBC with Differential/Platelet - Hepatic Function Panel (6)

## 2021-01-11 LAB — THYROID PANEL WITH TSH
Free Thyroxine Index: 1.6 (ref 1.2–4.9)
T3 Uptake Ratio: 27 % (ref 24–39)
T4, Total: 6 ug/dL (ref 4.5–12.0)
TSH: 1.43 u[IU]/mL (ref 0.450–4.500)

## 2021-01-11 LAB — LIPID PANEL WITH LDL/HDL RATIO
Cholesterol, Total: 182 mg/dL (ref 100–199)
HDL: 68 mg/dL (ref >39)
LDL Chol Calc (NIH): 93 mg/dL (ref 0–99)
LDL/HDL Ratio: 1.4 ratio (ref 0.0–3.2)
Triglycerides: 120 mg/dL (ref 0–149)
VLDL Cholesterol Cal: 21 mg/dL (ref 5–40)

## 2021-01-11 LAB — CBC WITH DIFFERENTIAL/PLATELET
Basophils Absolute: 0.1 10*3/uL (ref 0.0–0.2)
Basos: 1 %
EOS (ABSOLUTE): 0.3 10*3/uL (ref 0.0–0.4)
Eos: 3 %
Hematocrit: 40.5 % (ref 34.0–46.6)
Hemoglobin: 13.7 g/dL (ref 11.1–15.9)
Immature Grans (Abs): 0.1 10*3/uL (ref 0.0–0.1)
Immature Granulocytes: 1 %
Lymphocytes Absolute: 2.5 10*3/uL (ref 0.7–3.1)
Lymphs: 23 %
MCH: 32.5 pg (ref 26.6–33.0)
MCHC: 33.8 g/dL (ref 31.5–35.7)
MCV: 96 fL (ref 79–97)
Monocytes Absolute: 0.7 10*3/uL (ref 0.1–0.9)
Monocytes: 7 %
Neutrophils Absolute: 7.2 10*3/uL — ABNORMAL HIGH (ref 1.4–7.0)
Neutrophils: 65 %
Platelets: 389 10*3/uL (ref 150–450)
RBC: 4.21 x10E6/uL (ref 3.77–5.28)
RDW: 12.8 % (ref 11.7–15.4)
WBC: 10.9 10*3/uL — ABNORMAL HIGH (ref 3.4–10.8)

## 2021-01-11 LAB — HEPATIC FUNCTION PANEL (6)
ALT: 11 IU/L (ref 0–32)
AST: 17 IU/L (ref 0–40)
Alkaline Phosphatase: 72 IU/L (ref 44–121)
Bilirubin Total: <0.2 mg/dL (ref 0.0–1.2)
Bilirubin, Direct: <0.1 mg/dL (ref 0.00–0.40)

## 2021-01-11 LAB — RENAL FUNCTION PANEL
Albumin: 4.6 g/dL (ref 3.8–4.8)
BUN/Creatinine Ratio: 23 (ref 12–28)
BUN: 17 mg/dL (ref 8–27)
CO2: 21 mmol/L (ref 20–29)
Calcium: 9.8 mg/dL (ref 8.7–10.3)
Chloride: 103 mmol/L (ref 96–106)
Creatinine, Ser: 0.74 mg/dL (ref 0.57–1.00)
GFR calc Af Amer: 96 mL/min/{1.73_m2} (ref >59)
GFR calc non Af Amer: 84 mL/min/{1.73_m2} (ref >59)
Glucose: 96 mg/dL (ref 65–99)
Phosphorus: 3.6 mg/dL (ref 3.0–4.3)
Potassium: 4.6 mmol/L (ref 3.5–5.2)
Sodium: 139 mmol/L (ref 134–144)

## 2021-01-24 ENCOUNTER — Telehealth: Payer: Self-pay | Admitting: *Deleted

## 2021-01-24 NOTE — Telephone Encounter (Signed)
Left message for patient to contact Shawn to schedule screening.[phone]

## 2021-02-07 ENCOUNTER — Ambulatory Visit
Admission: RE | Admit: 2021-02-07 | Discharge: 2021-02-07 | Disposition: A | Discharge status: Home / Self Care | Payer: Medicare Other | Source: Ambulatory Visit | Attending: Family Medicine | Admitting: Family Medicine

## 2021-02-07 ENCOUNTER — Other Ambulatory Visit: Payer: Self-pay

## 2021-02-07 DIAGNOSIS — Z1231 Encounter for screening mammogram for malignant neoplasm of breast: Secondary | ICD-10-CM | POA: Insufficient documentation

## 2021-04-26 ENCOUNTER — Encounter: Payer: Self-pay | Admitting: Family Medicine

## 2021-04-26 ENCOUNTER — Ambulatory Visit (INDEPENDENT_AMBULATORY_CARE_PROVIDER_SITE_OTHER): Payer: Medicare Other | Admitting: Family Medicine

## 2021-04-26 ENCOUNTER — Other Ambulatory Visit: Payer: Self-pay

## 2021-04-26 VITALS — BP 136/70 | HR 72 | Temp 97.8°F | Ht 66.0 in | Wt 132.0 lb

## 2021-04-26 DIAGNOSIS — J0100 Acute maxillary sinusitis, unspecified: Secondary | ICD-10-CM | POA: Diagnosis not present

## 2021-04-26 MED ORDER — AMOXICILLIN 500 MG PO CAPS: 500.0000 mg | ORAL_CAPSULE | Freq: Three times a day (TID) | ORAL | 0 refills | Status: DC

## 2021-04-26 MED ORDER — FLUCONAZOLE 150 MG PO TABS: 150.0000 mg | ORAL_TABLET | Freq: Once | ORAL | 0 refills | Status: AC

## 2021-04-26 NOTE — Progress Notes (Signed)
Date:  04/26/2021   Name:  Marilyn Molina   DOB:  05-20-1951   MRN:  [id number]   Chief Complaint: Sinusitis (Started Monday. Pain in face, ear pain, and drainage. Mild headache behind eyes. Taking Excedrin. )  Sinusitis This is a new problem. The current episode started in the past 7 days. The problem has been gradually worsening since onset. There has been no fever. The pain is mild. Associated symptoms include congestion, coughing, ear pain, headaches, sinus pressure and a sore throat. Pertinent negatives include no chills, diaphoresis, hoarse voice, neck pain, shortness of breath, sneezing or swollen glands. The treatment provided moderate relief.    Lab Results  Component Value Date   CREATININE 0.74 01/10/2021   BUN 17 01/10/2021   NA 139 01/10/2021   K 4.6 01/10/2021   CL 103 01/10/2021   CO2 21 01/10/2021   Lab Results  Component Value Date   CHOL 182 01/10/2021   HDL 68 01/10/2021   LDLCALC 93 01/10/2021   TRIG 120 01/10/2021   CHOLHDL 2.9 10/08/2018   Lab Results  Component Value Date   TSH 1.430 01/10/2021   No results found for: HGBA1C Lab Results  Component Value Date   WBC 10.9 (H) 01/10/2021   HGB 13.7 01/10/2021   HCT 40.5 01/10/2021   MCV 96 01/10/2021   PLT 389 01/10/2021   Lab Results  Component Value Date   ALT 11 01/10/2021   AST 17 01/10/2021   ALKPHOS 72 01/10/2021   BILITOT <0.2 01/10/2021     Review of Systems  Constitutional: Negative for chills, diaphoresis and fever.  HENT: Positive for congestion, ear pain, sinus pressure and sore throat. Negative for drooling, ear discharge, hoarse voice and sneezing.   Respiratory: Positive for cough. Negative for shortness of breath and wheezing.   Cardiovascular: Negative for chest pain, palpitations and leg swelling.  Gastrointestinal: Negative for abdominal pain, blood in stool, constipation, diarrhea and nausea.  Endocrine: Negative for polydipsia.  Genitourinary: Negative for  dysuria, frequency, hematuria and urgency.  Musculoskeletal: Negative for back pain, myalgias and neck pain.  Skin: Negative for rash.  Allergic/Immunologic: Negative for environmental allergies.  Neurological: Positive for headaches. Negative for dizziness.  Hematological: Does not bruise/bleed easily.  Psychiatric/Behavioral: Negative for suicidal ideas. The patient is not nervous/anxious.     Patient Active Problem List   Diagnosis Date Noted  . Anxiety 11/21/2015  . Essential hypertension 11/21/2015  . Anxiety and depression 11/03/2012  . Cardiomyopathy (HCC) 10/29/2011  . Cigarette nicotine dependence without complication 10/29/2011  . Tobacco abuse 10/29/2011    No Known Allergies  Past Surgical History:  Procedure Laterality Date  . OVARIAN CYST REMOVAL      Social History   Tobacco Use  . Smoking status: Current Every Day Smoker    Packs/day: 1.00    Years: 47.00    Pack years: 47.00    Types: Cigarettes  . Smokeless tobacco: Never Used  Vaping Use  . Vaping Use: Never used  Substance Use Topics  . Alcohol use: No  . Drug use: No     Medication list has been reviewed and updated.  Current Meds  Medication Sig  . atorvastatin (LIPITOR) 10 MG tablet Take 1 tablet (10 mg total) by mouth daily.  . Calcium Carbonate-Vit D-Min (CALCIUM 1200 PO) Take 1 tablet by mouth daily.  . carvedilol (COREG) 3.125 MG tablet Take 1 tablet (3.125 mg total) by mouth 2 (two) times daily with a  meal.  . cholecalciferol (VITAMIN D3) 25 MCG (1000 UNIT) tablet Take 1,000 Units by mouth daily.  Marland Kitchen losartan (COZAAR) 50 MG tablet Take 1 tablet (50 mg total) by mouth daily at 6 (six) AM.  . montelukast (SINGULAIR) 10 MG tablet Take 1 tablet (10 mg total) by mouth daily.  . Multiple Vitamin (MULTI-VITAMIN) tablet Take 1 tablet by mouth daily.  . sertraline (ZOLOFT) 100 MG tablet Take 1 tablet (100 mg total) by mouth daily.  . vitamin B-12 (CYANOCOBALAMIN) 1000 MCG tablet Take 1,000 mcg  by mouth daily.    PHQ 2/9 Scores 04/26/2021 01/10/2021 01/01/2021 09/06/2020  PHQ - 2 Score 0 0 0 0  PHQ- 9 Score 0 0 - 0    GAD 7 : Generalized Anxiety Score 04/26/2021 01/10/2021 09/06/2020 04/17/2020  Nervous, Anxious, on Edge 0 0 0 0  Control/stop worrying 0 0 0 0  Worry too much - different things 0 0 0 0  Trouble relaxing 0 0 0 0  Restless 0 0 0 0  Easily annoyed or irritable 0 0 0 0  Afraid - awful might happen 0 0 0 0  Total GAD 7 Score 0 0 0 0  Anxiety Difficulty Not difficult at all - - -    BP Readings from Last 3 Encounters:  04/26/21 136/70  01/10/21 110/62  09/06/20 120/60    Physical Exam Vitals and nursing note reviewed.  Constitutional:      Appearance: She is well-developed.  HENT:     Head: Normocephalic.     Jaw: There is normal jaw occlusion.     Right Ear: Tympanic membrane, ear canal and external ear normal.     Left Ear: Tympanic membrane, ear canal and external ear normal.     Nose:     Right Turbinates: Swollen. Not enlarged.     Left Turbinates: Swollen. Not enlarged.     Right Sinus: Maxillary sinus tenderness present. No frontal sinus tenderness.     Left Sinus: Maxillary sinus tenderness present. No frontal sinus tenderness.     Mouth/Throat:     Pharynx: Posterior oropharyngeal erythema present.  Eyes:     General: Lids are everted, no foreign bodies appreciated. No scleral icterus.       Left eye: No foreign body or hordeolum.     Conjunctiva/sclera: Conjunctivae normal.     Right eye: Right conjunctiva is not injected.     Left eye: Left conjunctiva is not injected.     Pupils: Pupils are equal, round, and reactive to light.  Neck:     Thyroid: No thyromegaly.     Vascular: No JVD.     Trachea: No tracheal deviation.  Cardiovascular:     Rate and Rhythm: Normal rate and regular rhythm.     Heart sounds: Normal heart sounds. No murmur heard. No friction rub. No gallop.   Pulmonary:     Effort: Pulmonary effort is normal. No respiratory  distress.     Breath sounds: Normal breath sounds. No wheezing or rales.  Abdominal:     General: Bowel sounds are normal.     Palpations: Abdomen is soft. There is no mass.     Tenderness: There is no abdominal tenderness. There is no guarding or rebound.  Musculoskeletal:        General: No tenderness. Normal range of motion.     Cervical back: Normal range of motion and neck supple.  Lymphadenopathy:     Cervical: No cervical adenopathy.  Skin:  General: Skin is warm.     Findings: No rash.  Neurological:     Mental Status: She is alert and oriented to person, place, and time.     Cranial Nerves: No cranial nerve deficit.     Deep Tendon Reflexes: Reflexes normal.  Psychiatric:        Mood and Affect: Mood is not anxious or depressed.     Wt Readings from Last 3 Encounters:  04/26/21 132 lb (59.9 kg)  01/10/21 130 lb (59 kg)  09/06/20 130 lb (59 kg)    BP 136/70   Pulse 72   Temp 97.8 F (36.6 C) (Oral)   Ht 5\' 6"  (1.676 m)   Wt 132 lb (59.9 kg)   SpO2 99%   BMI 21.31 kg/m   Assessment and Plan: 1. Acute non-recurrent maxillary sinusitis Acute.  Persistent.  Stable.  History and examination is consistent with an acute maxillary sinusitis with tenderness over both maxillary sinuses.  We will treat with amoxicillin 500 mg 3 times a day for 10 days and patient has been given Diflucan and that she is going to the beach and this will be necessary probably so the there will not be an ongoing/recurrent candidiasis concern. - amoxicillin (AMOXIL) 500 MG capsule; Take 1 capsule (500 mg total) by mouth 3 (three) times daily for 10 days.  Dispense: 30 capsule; Refill: 0 - fluconazole (DIFLUCAN) 150 MG tablet; Take 1 tablet (150 mg total) by mouth once for 1 dose.  Dispense: 1 tablet; Refill: 0

## 2021-05-02 ENCOUNTER — Telehealth (INDEPENDENT_AMBULATORY_CARE_PROVIDER_SITE_OTHER): Payer: Self-pay

## 2021-05-02 NOTE — Telephone Encounter (Signed)
Called and left message on pt's vm to complete the antibiotic

## 2021-05-02 NOTE — Telephone Encounter (Signed)
Marilyn Molina is not a patient at [facility]. Patient last saw Marilyn Molina M.D on May 26.  Copied from CRM [id number]. Topic: General - Other >> May 02, 2021  8:36 AM Tamela Oddi wrote: Reason for CRM: Patient is calling to inform the doctor that the medication that she prescribed on her last visit is not making her feel any better.  She would like another medication or if she has to come back in.    Please advise and call to discuss at [phone]

## 2021-05-04 ENCOUNTER — Other Ambulatory Visit: Payer: Self-pay

## 2021-05-04 ENCOUNTER — Telehealth: Payer: Self-pay

## 2021-05-04 DIAGNOSIS — J0100 Acute maxillary sinusitis, unspecified: Secondary | ICD-10-CM

## 2021-05-04 MED ORDER — CEFUROXIME AXETIL 500 MG PO TABS: 500.0000 mg | ORAL_TABLET | Freq: Two times a day (BID) | ORAL | 0 refills | Status: AC

## 2021-05-04 NOTE — Telephone Encounter (Signed)
Sent in a change to ceftin

## 2021-05-04 NOTE — Telephone Encounter (Unsigned)
Copied from CRM [id number]. Topic: General - Other >> May 04, 2021 10:16 AM Wyonia Hough E wrote: Reason for CRM: Pt would like Delice Bison to call her back about her sinus infection/ pt stated she is not getting much better and now she is not able to sleep due to coughing / please advise

## 2021-05-04 NOTE — Progress Notes (Signed)
Sent in ceftin

## 2021-07-02 ENCOUNTER — Ambulatory Visit (INDEPENDENT_AMBULATORY_CARE_PROVIDER_SITE_OTHER): Payer: Medicare Other | Admitting: Family Medicine

## 2021-07-02 ENCOUNTER — Encounter: Payer: Self-pay | Admitting: Family Medicine

## 2021-07-02 ENCOUNTER — Other Ambulatory Visit: Payer: Self-pay

## 2021-07-02 DIAGNOSIS — J4521 Mild intermittent asthma with (acute) exacerbation: Secondary | ICD-10-CM

## 2021-07-02 DIAGNOSIS — E785 Hyperlipidemia, unspecified: Secondary | ICD-10-CM

## 2021-07-02 DIAGNOSIS — I10 Essential (primary) hypertension: Secondary | ICD-10-CM | POA: Diagnosis not present

## 2021-07-02 DIAGNOSIS — F324 Major depressive disorder, single episode, in partial remission: Secondary | ICD-10-CM | POA: Diagnosis not present

## 2021-07-02 MED ORDER — SERTRALINE HCL 100 MG PO TABS: 100.0000 mg | ORAL_TABLET | Freq: Every day | ORAL | 1 refills | Status: DC

## 2021-07-02 MED ORDER — ATORVASTATIN CALCIUM 10 MG PO TABS: 10.0000 mg | ORAL_TABLET | Freq: Every day | ORAL | 1 refills | Status: DC

## 2021-07-02 MED ORDER — MONTELUKAST SODIUM 10 MG PO TABS: 10.0000 mg | ORAL_TABLET | Freq: Every day | ORAL | 1 refills | Status: DC

## 2021-07-02 MED ORDER — CARVEDILOL 3.125 MG PO TABS: 3.1250 mg | ORAL_TABLET | Freq: Two times a day (BID) | ORAL | 1 refills | Status: DC

## 2021-07-02 MED ORDER — LOSARTAN POTASSIUM 50 MG PO TABS: 50.0000 mg | ORAL_TABLET | Freq: Every day | ORAL | 1 refills | Status: DC

## 2021-07-02 NOTE — Progress Notes (Signed)
Date:  07/02/2021   Name:  Marilyn Molina   DOB:  05/10/1951   MRN:  [id number]   Chief Complaint: Hypertension, Hyperlipidemia, Depression, and Asthma  Hypertension This is a new problem. The current episode started more than 1 year ago. The problem has been gradually improving since onset. The problem is controlled. Pertinent negatives include no anxiety, blurred vision, chest pain, headaches, malaise/fatigue, neck pain, orthopnea, palpitations, peripheral edema, PND, shortness of breath or sweats. There are no associated agents to hypertension. Risk factors for coronary artery disease include dyslipidemia. Past treatments include alpha 1 blockers, beta blockers and angiotensin blockers. The current treatment provides moderate improvement. There are no compliance problems.  There is no history of angina, kidney disease, CAD/MI, CVA, heart failure, left ventricular hypertrophy, PVD or retinopathy. There is no history of chronic renal disease, a hypertension causing med or renovascular disease.  Hyperlipidemia This is a chronic problem. The current episode started more than 1 year ago. Recent lipid tests were reviewed and are normal. She has no history of chronic renal disease. Pertinent negatives include no chest pain, focal sensory loss, focal weakness, leg pain, myalgias or shortness of breath. Current antihyperlipidemic treatment includes statins. The current treatment provides moderate improvement of lipids. There are no compliance problems.   Depression        This is a chronic problem.  The current episode started more than 1 year ago.   The problem occurs intermittently.  Associated symptoms include no decreased concentration, no fatigue, no helplessness, no hopelessness, does not have insomnia, not irritable, no restlessness, no decreased interest, no appetite change, no body aches, no myalgias, no headaches, no indigestion, not sad and no suicidal ideas.  Past treatments include SSRIs -  Selective serotonin reuptake inhibitors.  Compliance with treatment is good.  Previous treatment provided moderate relief.   Pertinent negatives include no anxiety. Asthma There is no chest tightness, cough, difficulty breathing, frequent throat clearing, hemoptysis, hoarse voice, shortness of breath, sputum production or wheezing. This is a chronic problem. Pertinent negatives include no appetite change, chest pain, dyspnea on exertion, ear congestion, ear pain, fever, headaches, heartburn, malaise/fatigue, myalgias, nasal congestion, orthopnea, PND, postnasal drip, rhinorrhea, sneezing, sore throat, sweats, trouble swallowing or weight loss. She reports moderate improvement on treatment. Her symptoms are not alleviated by leukotriene antagonist. Her past medical history is significant for asthma.   Lab Results  Component Value Date   CREATININE 0.74 01/10/2021   BUN 17 01/10/2021   NA 139 01/10/2021   K 4.6 01/10/2021   CL 103 01/10/2021   CO2 21 01/10/2021   Lab Results  Component Value Date   CHOL 182 01/10/2021   HDL 68 01/10/2021   LDLCALC 93 01/10/2021   TRIG 120 01/10/2021   CHOLHDL 2.9 10/08/2018   Lab Results  Component Value Date   TSH 1.430 01/10/2021   No results found for: HGBA1C Lab Results  Component Value Date   WBC 10.9 (H) 01/10/2021   HGB 13.7 01/10/2021   HCT 40.5 01/10/2021   MCV 96 01/10/2021   PLT 389 01/10/2021   Lab Results  Component Value Date   ALT 11 01/10/2021   AST 17 01/10/2021   ALKPHOS 72 01/10/2021   BILITOT <0.2 01/10/2021     Review of Systems  Constitutional:  Negative for appetite change, chills, fatigue, fever, malaise/fatigue and weight loss.  HENT:  Negative for drooling, ear discharge, ear pain, hoarse voice, postnasal drip, rhinorrhea, sneezing, sore throat and  trouble swallowing.   Eyes:  Negative for blurred vision.  Respiratory:  Negative for cough, hemoptysis, sputum production, shortness of breath and wheezing.    Cardiovascular:  Negative for chest pain, dyspnea on exertion, palpitations, orthopnea, leg swelling and PND.  Gastrointestinal:  Negative for abdominal pain, blood in stool, constipation, diarrhea, heartburn and nausea.  Endocrine: Negative for polydipsia.  Genitourinary:  Negative for dysuria, frequency, hematuria and urgency.  Musculoskeletal:  Negative for back pain, myalgias and neck pain.  Skin:  Negative for rash.  Allergic/Immunologic: Negative for environmental allergies.  Neurological:  Negative for dizziness, focal weakness and headaches.  Hematological:  Does not bruise/bleed easily.  Psychiatric/Behavioral:  Positive for depression. Negative for decreased concentration and suicidal ideas. The patient is not nervous/anxious and does not have insomnia.    Patient Active Problem List   Diagnosis Date Noted   Anxiety 11/21/2015   Essential hypertension 11/21/2015   Anxiety and depression 11/03/2012   Cardiomyopathy (HCC) 10/29/2011   Cigarette nicotine dependence without complication 10/29/2011   Tobacco abuse 10/29/2011    No Known Allergies  Past Surgical History:  Procedure Laterality Date   OVARIAN CYST REMOVAL      Social History   Tobacco Use   Smoking status: Every Day    Packs/day: 1.00    Years: 47.00    Pack years: 47.00    Types: Cigarettes   Smokeless tobacco: Never  Vaping Use   Vaping Use: Never used  Substance Use Topics   Alcohol use: No   Drug use: No     Medication list has been reviewed and updated.  Current Meds  Medication Sig   Calcium Carbonate-Vit D-Min (CALCIUM 1200 PO) Take 1 tablet by mouth daily.   cholecalciferol (VITAMIN D3) 25 MCG (1000 UNIT) tablet Take 1,000 Units by mouth daily.   Multiple Vitamin (MULTI-VITAMIN) tablet Take 1 tablet by mouth daily.   vitamin B-12 (CYANOCOBALAMIN) 1000 MCG tablet Take 1,000 mcg by mouth daily.   [DISCONTINUED] atorvastatin (LIPITOR) 10 MG tablet Take 1 tablet (10 mg total) by mouth  daily.   [DISCONTINUED] carvedilol (COREG) 3.125 MG tablet Take 1 tablet (3.125 mg total) by mouth 2 (two) times daily with a meal.   [DISCONTINUED] losartan (COZAAR) 50 MG tablet Take 1 tablet (50 mg total) by mouth daily at 6 (six) AM.   [DISCONTINUED] montelukast (SINGULAIR) 10 MG tablet Take 1 tablet (10 mg total) by mouth daily.   [DISCONTINUED] sertraline (ZOLOFT) 100 MG tablet Take 1 tablet (100 mg total) by mouth daily.    PHQ 2/9 Scores 07/02/2021 04/26/2021 01/10/2021 01/01/2021  PHQ - 2 Score 0 0 0 0  PHQ- 9 Score - 0 0 -    GAD 7 : Generalized Anxiety Score 07/02/2021 04/26/2021 01/10/2021 09/06/2020  Nervous, Anxious, on Edge 0 0 0 0  Control/stop worrying 0 0 0 0  Worry too much - different things 0 0 0 0  Trouble relaxing 0 0 0 0  Restless 0 0 0 0  Easily annoyed or irritable 0 0 0 0  Afraid - awful might happen 0 0 0 0  Total GAD 7 Score 0 0 0 0  Anxiety Difficulty Not difficult at all Not difficult at all - -    BP Readings from Last 3 Encounters:  07/02/21 124/70  04/26/21 136/70  01/10/21 110/62    Physical Exam Vitals and nursing note reviewed.  Constitutional:      General: She is not irritable.    Appearance: She  is well-developed.  HENT:     Head: Normocephalic.     Right Ear: Tympanic membrane, ear canal and external ear normal.     Left Ear: Tympanic membrane, ear canal and external ear normal.     Nose: Nose normal.  Eyes:     General: Lids are everted, no foreign bodies appreciated. No scleral icterus.       Left eye: No foreign body or hordeolum.     Conjunctiva/sclera: Conjunctivae normal.     Right eye: Right conjunctiva is not injected.     Left eye: Left conjunctiva is not injected.     Pupils: Pupils are equal, round, and reactive to light.  Neck:     Thyroid: No thyromegaly.     Vascular: No JVD.     Trachea: No tracheal deviation.  Cardiovascular:     Rate and Rhythm: Normal rate and regular rhythm.     Heart sounds: Normal heart sounds. No  murmur heard.   No friction rub. No gallop.  Pulmonary:     Effort: Pulmonary effort is normal. No respiratory distress.     Breath sounds: Normal breath sounds. No wheezing, rhonchi or rales.  Abdominal:     General: Bowel sounds are normal.     Palpations: Abdomen is soft. There is no mass.     Tenderness: There is no abdominal tenderness. There is no guarding or rebound.  Musculoskeletal:        General: No tenderness. Normal range of motion.     Cervical back: Normal range of motion and neck supple.  Lymphadenopathy:     Cervical: No cervical adenopathy.  Skin:    General: Skin is warm.     Findings: No rash.  Neurological:     Mental Status: She is alert and oriented to person, place, and time.     Cranial Nerves: No cranial nerve deficit.     Deep Tendon Reflexes: Reflexes normal.  Psychiatric:        Mood and Affect: Mood is not anxious or depressed.    Wt Readings from Last 3 Encounters:  07/02/21 127 lb (57.6 kg)  04/26/21 132 lb (59.9 kg)  01/10/21 130 lb (59 kg)    BP 124/70 (BP Location: Right Arm, Patient Position: Sitting, Cuff Size: Normal)   Pulse 72   Ht 5\' 6"  (1.676 m)   Wt 127 lb (57.6 kg)   SpO2 97%   BMI 20.50 kg/m   Assessment and Plan: 1. Hyperlipidemia, unspecified hyperlipidemia type Chronic.  Controlled.  Stable.  Continue atorvastatin 10 mg once a day. - atorvastatin (LIPITOR) 10 MG tablet; Take 1 tablet (10 mg total) by mouth daily.  Dispense: 90 tablet; Refill: 1  2. Essential hypertension Chronic.  Controlled.  Stable.  Blood pressure is 124/70.  Continue carvedilol 3.1251 twice a day and losartan 50 mg once a day. - carvedilol (COREG) 3.125 MG tablet; Take 1 tablet (3.125 mg total) by mouth 2 (two) times daily with a meal.  Dispense: 180 tablet; Refill: 1 - losartan (COZAAR) 50 MG tablet; Take 1 tablet (50 mg total) by mouth daily at 6 (six) AM.  Dispense: 90 tablet; Refill: 1  3. Mild intermittent asthma with acute  exacerbation Chronic.  Controlled.  Intermittent.  Relatively stable with mild symptomatology.  Continue Singulair 10 mg once a day. - montelukast (SINGULAIR) 10 MG tablet; Take 1 tablet (10 mg total) by mouth daily.  Dispense: 90 tablet; Refill: 1  4. Major depressive disorder in  partial remission, unspecified whether recurrent (HCC) .  Controlled.  Stable.  PHQ is 0 Gad score 0 continue sertraline 100 mg once a day. - sertraline (ZOLOFT) 100 MG tablet; Take 1 tablet (100 mg total) by mouth daily.  Dispense: 90 tablet; Refill: 1

## 2021-09-05 ENCOUNTER — Other Ambulatory Visit: Payer: Self-pay | Admitting: Family Medicine

## 2021-09-05 DIAGNOSIS — E785 Hyperlipidemia, unspecified: Secondary | ICD-10-CM

## 2021-09-05 DIAGNOSIS — F324 Major depressive disorder, single episode, in partial remission: Secondary | ICD-10-CM

## 2021-10-18 ENCOUNTER — Other Ambulatory Visit: Payer: Self-pay

## 2021-10-18 ENCOUNTER — Ambulatory Visit (INDEPENDENT_AMBULATORY_CARE_PROVIDER_SITE_OTHER): Payer: Medicare Other | Admitting: Family Medicine

## 2021-10-18 ENCOUNTER — Encounter: Payer: Self-pay | Admitting: Family Medicine

## 2021-10-18 VITALS — BP 122/80 | HR 84 | Ht 69.0 in | Wt 129.0 lb

## 2021-10-18 DIAGNOSIS — J0100 Acute maxillary sinusitis, unspecified: Secondary | ICD-10-CM

## 2021-10-18 MED ORDER — AMOXICILLIN-POT CLAVULANATE 875-125 MG PO TABS: 1.0000 | ORAL_TABLET | Freq: Two times a day (BID) | ORAL | 0 refills | Status: DC

## 2021-10-18 NOTE — Progress Notes (Signed)
Date:  10/18/2021   Name:  Marilyn Molina   DOB:  1951/09/25   MRN:  [id number]   Chief Complaint: No chief complaint on file.  Sinusitis This is a new problem. The current episode started yesterday. The problem has been gradually worsening since onset. The maximum temperature recorded prior to her arrival was 100.4 - 100.9 F. The fever has been present for 1 to 2 days. The pain is mild. Associated symptoms include chills, congestion, coughing (yellow production and thick), ear pain, headaches, a hoarse voice, sinus pressure and a sore throat. Pertinent negatives include no diaphoresis or shortness of breath. The treatment provided moderate relief.   Lab Results  Component Value Date   CREATININE 0.74 01/10/2021   BUN 17 01/10/2021   NA 139 01/10/2021   K 4.6 01/10/2021   CL 103 01/10/2021   CO2 21 01/10/2021   Lab Results  Component Value Date   CHOL 182 01/10/2021   HDL 68 01/10/2021   LDLCALC 93 01/10/2021   TRIG 120 01/10/2021   CHOLHDL 2.9 10/08/2018   Lab Results  Component Value Date   TSH 1.430 01/10/2021   No results found for: HGBA1C Lab Results  Component Value Date   WBC 10.9 (H) 01/10/2021   HGB 13.7 01/10/2021   HCT 40.5 01/10/2021   MCV 96 01/10/2021   PLT 389 01/10/2021   Lab Results  Component Value Date   ALT 11 01/10/2021   AST 17 01/10/2021   ALKPHOS 72 01/10/2021   BILITOT <0.2 01/10/2021   No components found for: VITD  Review of Systems  Constitutional:  Positive for chills. Negative for diaphoresis, fatigue and fever.  HENT:  Positive for congestion, ear pain, hoarse voice, sinus pressure and sore throat. Negative for drooling and facial swelling.   Respiratory:  Positive for cough (yellow production and thick). Negative for chest tightness, shortness of breath and wheezing.   Cardiovascular:  Negative for chest pain.  Neurological:  Positive for headaches.   Patient Active Problem List   Diagnosis Date Noted   Anxiety  11/21/2015   Essential hypertension 11/21/2015   Anxiety and depression 11/03/2012   Cardiomyopathy (HCC) 10/29/2011   Cigarette nicotine dependence without complication 10/29/2011   Tobacco abuse 10/29/2011    No Known Allergies  Past Surgical History:  Procedure Laterality Date   OVARIAN CYST REMOVAL      Social History   Tobacco Use   Smoking status: Every Day    Packs/day: 1.00    Years: 47.00    Pack years: 47.00    Types: Cigarettes   Smokeless tobacco: Never  Vaping Use   Vaping Use: Never used  Substance Use Topics   Alcohol use: No   Drug use: No     Medication list has been reviewed and updated.  No outpatient medications have been marked as taking for the 10/18/21 encounter (Appointment) with Duanne Limerick, MD.    [facility] 2/9 Scores 07/02/2021 04/26/2021 01/10/2021 01/01/2021  PHQ - 2 Score 0 0 0 0  PHQ- 9 Score - 0 0 -    GAD 7 : Generalized Anxiety Score 07/02/2021 04/26/2021 01/10/2021 09/06/2020  Nervous, Anxious, on Edge 0 0 0 0  Control/stop worrying 0 0 0 0  Worry too much - different things 0 0 0 0  Trouble relaxing 0 0 0 0  Restless 0 0 0 0  Easily annoyed or irritable 0 0 0 0  Afraid - awful might happen 0  0 0 0  Total GAD 7 Score 0 0 0 0  Anxiety Difficulty Not difficult at all Not difficult at all - -    BP Readings from Last 3 Encounters:  07/02/21 124/70  04/26/21 136/70  01/10/21 110/62    Physical Exam Vitals and nursing note reviewed.  Constitutional:      General: She is not in acute distress.    Appearance: She is not diaphoretic.  HENT:     Head: Normocephalic and atraumatic.     Right Ear: Tympanic membrane, ear canal and external ear normal. There is no impacted cerumen.     Left Ear: Tympanic membrane, ear canal and external ear normal. There is no impacted cerumen.     Nose: Nose normal. No congestion or rhinorrhea.  Eyes:     General:        Right eye: No discharge.        Left eye: No discharge.     Conjunctiva/sclera:  Conjunctivae normal.     Pupils: Pupils are equal, round, and reactive to light.  Neck:     Thyroid: No thyromegaly.     Vascular: No JVD.  Cardiovascular:     Rate and Rhythm: Normal rate and regular rhythm.     Heart sounds: Normal heart sounds. No murmur heard.   No friction rub. No gallop.  Pulmonary:     Effort: Pulmonary effort is normal.     Breath sounds: Normal breath sounds. No wheezing or rhonchi.  Abdominal:     General: Bowel sounds are normal.     Palpations: Abdomen is soft. There is no mass.     Tenderness: There is no abdominal tenderness. There is no guarding or rebound.  Musculoskeletal:        General: Normal range of motion.     Cervical back: Normal range of motion and neck supple.  Lymphadenopathy:     Cervical: No cervical adenopathy.  Skin:    General: Skin is warm and dry.  Neurological:     Mental Status: She is alert.     Deep Tendon Reflexes: Reflexes are normal and symmetric.    Wt Readings from Last 3 Encounters:  07/02/21 127 lb (57.6 kg)  04/26/21 132 lb (59.9 kg)  01/10/21 130 lb (59 kg)    There were no vitals taken for this visit.  Assessment and Plan:  1. Acute maxillary sinusitis, recurrence not specified New onset.  Persistent.  Uncomplicated.  Patient has facial discomfort and productive discharge.  We will proceed with antibiotic coverage of Augmentin 875 mg twice a day.  Patient's been encouraged to obtain Mucinex DM for cough suppression and mucolytic activity. - amoxicillin-clavulanate (AUGMENTIN) 875-125 MG tablet; Take 1 tablet by mouth 2 (two) times daily.  Dispense: 20 tablet; Refill: 0

## 2021-11-08 ENCOUNTER — Ambulatory Visit: Payer: Self-pay | Admitting: *Deleted

## 2021-11-08 NOTE — Telephone Encounter (Signed)
Today home Covid positive. Symptoms started Tuesday. Nausea, muscle aches, nasal congestion, HA and ST and fatigue.  No SOB. Dry cough, chills/sweats  Chief Complaint: Covid home test positive today Symptoms: Muscle aches, HA, nasal congestion, fatigue, dry cough and nausea Frequency: Symptoms began Tuesday.  Pertinent Negatives: Patient denies SOB. Disposition: [] ED /[] Urgent Care (no appt availability in office) / [] Appointment(In office/virtual)/ []  [facility]/ [] Home Care/ [] Refused Recommended Disposition  Reviewed home care advice with the patient.  Additional Notes: Patient received 2nd Covid Booster on 10/31/21. Patient asking if she would qualify for Covid medication. Felt awful the day after booster but fine the next 3 days then symptoms began.  Routing for physician review-unsure if a virtual visit is appropriate for the patient.  Please advise     Answer Assessment - Initial Assessment Questions 1. COVID-19 EXPOSURE: "Please describe how you were exposed to someone with a COVID-19 infection."     Positive Covid 2. PLACE of CONTACT: "Where were you when you were exposed to COVID-19?" (e.g., home, school, medical waiting room; which city?)     unknown 3. TYPE of CONTACT: "How much contact was there?" (e.g., sitting next to, live in same house, work in same office, same building)     na 4. DURATION of CONTACT: "How long were you in contact with the COVID-19 patient?" (e.g., a few seconds, passed by person, a few minutes, 15 minutes or longer, live with the patient)     na 5. MASK: "Were you wearing a mask?" "Was the other person wearing a mask?" Note: wearing a mask reduces the risk of an otherwise close contact.     na 6. DATE of CONTACT: "When did you have contact with a COVID-19 patient?" (e.g., how many days ago)     na 7. COMMUNITY SPREAD: "Are there lots of cases of COVID-19 (community spread) where you live?" (See public health department website, if  unsure)        8. SYMPTOMS: "Do you have any symptoms?" (e.g., fever, cough, breathing difficulty, loss of taste or smell)     Dry cough, no taste or appetite 9. VACCINE: "Have you gotten the COVID-19 vaccine?" If Yes, ask: "Which one, how many shots, when did you get it?"     All four vaccines 10. BOOSTER: "Have you received your COVID-19 booster?" If Yes, ask: "Which one and when did you get it?"       Possibly took last vaccine last week 11. PREGNANCY OR POSTPARTUM: "Is there any chance you are pregnant?" "When was your last menstrual period?" "Did you deliver in the last 2 weeks?"        12. HIGH RISK: "Do you have any heart or lung problems?" (e.g., asthma , COPD, heart failure) "Do you have a weak immune system or other risk factors?" (e.g., HIV positive, chemotherapy, renal failure, diabetes mellitus, sickle cell anemia, obesity)       none 13. TRAVEL: "Have you traveled out of the country recently?" If Yes, ask: "When and where?"  Note: Travel becomes less relevant if there is widespread community transmission where the patient lives.       na  Protocols used: Coronavirus (COVID-19) Exposure-A-AH

## 2021-11-09 NOTE — Telephone Encounter (Signed)
Called to inform patient.

## 2021-12-30 ENCOUNTER — Other Ambulatory Visit: Payer: Self-pay | Admitting: Family Medicine

## 2021-12-30 DIAGNOSIS — I10 Essential (primary) hypertension: Secondary | ICD-10-CM

## 2021-12-30 NOTE — Telephone Encounter (Signed)
Appointment 01/02/22 Requested Prescriptions  Pending Prescriptions Disp Refills   carvedilol (COREG) 3.125 MG tablet [Pharmacy Med Name: CARVEDILOL 3.125 MG TABLET] 180 tablet 1    Sig: TAKE 1 TABLET BY MOUTH 2 TIMES DAILY WITH A MEAL.     Cardiovascular:  Beta Blockers Passed - 12/30/2021 10:27 AM      Passed - Last BP in normal range    BP Readings from Last 1 Encounters:  10/18/21 122/80         Passed - Last Heart Rate in normal range    Pulse Readings from Last 1 Encounters:  10/18/21 84         Passed - Valid encounter within last 6 months    Recent Outpatient Visits          2 months ago Acute maxillary sinusitis, recurrence not specified   [facility] Duanne Limerick, MD   6 months ago Hyperlipidemia, unspecified hyperlipidemia type   [facility] Duanne Limerick, MD   8 months ago Acute non-recurrent maxillary sinusitis   [facility] Duanne Limerick, MD   11 months ago Essential hypertension   [facility] Duanne Limerick, MD   1 year ago Essential hypertension   [facility] Duanne Limerick, MD      Future Appointments            In 1 week Duanne Limerick, MD [facility], [facility]

## 2022-01-07 ENCOUNTER — Ambulatory Visit (INDEPENDENT_AMBULATORY_CARE_PROVIDER_SITE_OTHER): Payer: Medicare Other | Admitting: Family Medicine

## 2022-01-07 ENCOUNTER — Encounter: Payer: Self-pay | Admitting: Family Medicine

## 2022-01-07 ENCOUNTER — Other Ambulatory Visit: Payer: Self-pay

## 2022-01-07 VITALS — BP 130/80 | HR 64 | Ht 69.0 in | Wt 126.0 lb

## 2022-01-07 DIAGNOSIS — J0100 Acute maxillary sinusitis, unspecified: Secondary | ICD-10-CM

## 2022-01-07 DIAGNOSIS — F324 Major depressive disorder, single episode, in partial remission: Secondary | ICD-10-CM

## 2022-01-07 DIAGNOSIS — E785 Hyperlipidemia, unspecified: Secondary | ICD-10-CM | POA: Diagnosis not present

## 2022-01-07 DIAGNOSIS — I10 Essential (primary) hypertension: Secondary | ICD-10-CM

## 2022-01-07 DIAGNOSIS — J4521 Mild intermittent asthma with (acute) exacerbation: Secondary | ICD-10-CM

## 2022-01-07 MED ORDER — SERTRALINE HCL 100 MG PO TABS: 100.0000 mg | ORAL_TABLET | Freq: Every day | ORAL | 1 refills | Status: DC

## 2022-01-07 MED ORDER — CARVEDILOL 3.125 MG PO TABS: 3.1250 mg | ORAL_TABLET | Freq: Two times a day (BID) | ORAL | 1 refills | Status: AC

## 2022-01-07 MED ORDER — MONTELUKAST SODIUM 10 MG PO TABS: 10.0000 mg | ORAL_TABLET | Freq: Every day | ORAL | 1 refills | Status: DC

## 2022-01-07 MED ORDER — ATORVASTATIN CALCIUM 10 MG PO TABS: 10.0000 mg | ORAL_TABLET | Freq: Every day | ORAL | 1 refills | Status: DC

## 2022-01-07 MED ORDER — AZITHROMYCIN 250 MG PO TABS: ORAL_TABLET | ORAL | 0 refills | Status: AC

## 2022-01-07 MED ORDER — LOSARTAN POTASSIUM 50 MG PO TABS: 50.0000 mg | ORAL_TABLET | Freq: Every day | ORAL | 1 refills | Status: DC

## 2022-01-07 NOTE — Progress Notes (Signed)
Date:  01/07/2022   Name:  Marilyn Molina   DOB:  Sep 02, 1951   MRN:  [id number]   Chief Complaint: Depression, Allergic Rhinitis , Hypertension, and Hyperlipidemia  Depression        This is a chronic problem.  The current episode started more than 1 year ago.   The onset quality is gradual.   The problem occurs daily.  The problem has been gradually improving since onset.  Associated symptoms include no decreased concentration, no fatigue, no helplessness, no hopelessness, does not have insomnia, not irritable, no restlessness, no decreased interest, no appetite change, no body aches, no myalgias, no headaches, no indigestion, not sad and no suicidal ideas.  Past treatments include SSRIs - Selective serotonin reuptake inhibitors.  Compliance with treatment is good.  Previous treatment provided moderate relief.   Pertinent negatives include no hypothyroidism and no anxiety. Hypertension This is a chronic problem. The current episode started more than 1 year ago. The problem has been waxing and waning since onset. The problem is controlled. Pertinent negatives include no anxiety, blurred vision, chest pain, headaches, malaise/fatigue, neck pain, orthopnea, palpitations, peripheral edema, PND, shortness of breath or sweats. There are no associated agents to hypertension. Risk factors for coronary artery disease include diabetes mellitus. Past treatments include beta blockers, alpha 1 blockers and angiotensin blockers. The current treatment provides moderate improvement. There are no compliance problems.  There is no history of angina, kidney disease, CAD/MI, CVA, heart failure, left ventricular hypertrophy, PVD or retinopathy. There is no history of chronic renal disease, a hypertension causing med or renovascular disease.  Hyperlipidemia This is a chronic problem. The current episode started more than 1 year ago. The problem is controlled. Recent lipid tests were reviewed and are normal. She has no  history of chronic renal disease, diabetes, hypothyroidism, liver disease, obesity or nephrotic syndrome. Pertinent negatives include no chest pain, focal sensory loss, focal weakness, leg pain, myalgias or shortness of breath. Current antihyperlipidemic treatment includes statins. The current treatment provides moderate improvement of lipids. There are no compliance problems.  Risk factors for coronary artery disease include dyslipidemia and hypertension.  URI  This is a chronic (allergies) problem. The current episode started more than 1 year ago. The problem has been gradually worsening. There has been no fever. Associated symptoms include congestion, coughing, sneezing and a sore throat. Pertinent negatives include no abdominal pain, chest pain, diarrhea, dysuria, ear pain, headaches, nausea, neck pain, rash, sinus pain or wheezing.  Sinusitis This is a new problem. The current episode started 1 to 4 weeks ago. The problem has been waxing and waning since onset. There has been no fever. Associated symptoms include congestion, coughing, diaphoresis, sinus pressure, sneezing and a sore throat. Pertinent negatives include no chills, ear pain, headaches, neck pain or shortness of breath.   Lab Results  Component Value Date   NA 139 01/10/2021   K 4.6 01/10/2021   CO2 21 01/10/2021   GLUCOSE 96 01/10/2021   BUN 17 01/10/2021   CREATININE 0.74 01/10/2021   CALCIUM 9.8 01/10/2021   GFRNONAA 84 01/10/2021   Lab Results  Component Value Date   CHOL 182 01/10/2021   HDL 68 01/10/2021   LDLCALC 93 01/10/2021   TRIG 120 01/10/2021   CHOLHDL 2.9 10/08/2018   Lab Results  Component Value Date   TSH 1.430 01/10/2021   No results found for: HGBA1C Lab Results  Component Value Date   WBC 10.9 (H) 01/10/2021  HGB 13.7 01/10/2021   HCT 40.5 01/10/2021   MCV 96 01/10/2021   PLT 389 01/10/2021   Lab Results  Component Value Date   ALT 11 01/10/2021   AST 17 01/10/2021   ALKPHOS 72  01/10/2021   BILITOT <0.2 01/10/2021   No results found for: 25OHVITD2, 25OHVITD3, VD25OH   Review of Systems  Constitutional:  Positive for diaphoresis. Negative for appetite change, chills, fatigue, fever and malaise/fatigue.  HENT:  Positive for congestion, sinus pressure, sneezing and sore throat. Negative for drooling, ear discharge, ear pain and sinus pain.   Eyes:  Negative for blurred vision.  Respiratory:  Positive for cough. Negative for shortness of breath and wheezing.   Cardiovascular:  Negative for chest pain, palpitations, orthopnea, leg swelling and PND.  Gastrointestinal:  Negative for abdominal pain, blood in stool, constipation, diarrhea and nausea.  Endocrine: Negative for polydipsia.  Genitourinary:  Negative for dysuria, frequency, hematuria and urgency.  Musculoskeletal:  Negative for back pain, myalgias and neck pain.  Skin:  Negative for rash.  Allergic/Immunologic: Negative for environmental allergies.  Neurological:  Negative for dizziness, focal weakness and headaches.  Hematological:  Does not bruise/bleed easily.  Psychiatric/Behavioral:  Positive for depression. Negative for decreased concentration and suicidal ideas. The patient is not nervous/anxious and does not have insomnia.    Patient Active Problem List   Diagnosis Date Noted   Anxiety 11/21/2015   Essential hypertension 11/21/2015   Anxiety and depression 11/03/2012   Cardiomyopathy ([place]) 10/29/2011   Cigarette nicotine dependence without complication [id number]   Tobacco abuse 10/29/2011    No Known Allergies  Past Surgical History:  Procedure Laterality Date   OVARIAN CYST REMOVAL      Social History   Tobacco Use   Smoking status: Every Day    Packs/day: 1.00    Years: 47.00    Pack years: 47.00    Types: Cigarettes   Smokeless tobacco: Never  Vaping Use   Vaping Use: Never used  Substance Use Topics   Alcohol use: No   Drug use: No     Medication list has been reviewed  and updated.  Current Meds  Medication Sig   atorvastatin (LIPITOR) 10 MG tablet TAKE 1 TABLET BY MOUTH EVERY DAY   Calcium Carbonate-Vit D-Min (CALCIUM 1200 PO) Take 1 tablet by mouth daily.   carvedilol (COREG) 3.125 MG tablet TAKE 1 TABLET BY MOUTH 2 TIMES DAILY WITH A MEAL.   cholecalciferol (VITAMIN D3) 25 MCG (1000 UNIT) tablet Take 1,000 Units by mouth daily.   losartan (COZAAR) 50 MG tablet Take 1 tablet (50 mg total) by mouth daily at 6 (six) AM.   montelukast (SINGULAIR) 10 MG tablet Take 1 tablet (10 mg total) by mouth daily.   Multiple Vitamin (MULTI-VITAMIN) tablet Take 1 tablet by mouth daily.   sertraline (ZOLOFT) 100 MG tablet TAKE 1 TABLET BY MOUTH EVERY DAY   vitamin B-12 (CYANOCOBALAMIN) 1000 MCG tablet Take 1,000 mcg by mouth daily.    PHQ 2/9 Scores 01/07/2022 07/02/2021 04/26/2021 01/10/2021  PHQ - 2 Score 0 0 0 0  PHQ- 9 Score 0 - 0 0    GAD 7 : Generalized Anxiety Score 01/07/2022 07/02/2021 04/26/2021 01/10/2021  Nervous, Anxious, on Edge 0 0 0 0  Control/stop worrying 0 0 0 0  Worry too much - different things 0 0 0 0  Trouble relaxing 0 0 0 0  Restless 0 0 0 0  Easily annoyed or irritable 0 0 0 0  Afraid - awful might happen 0 0 0 0  Total GAD 7 Score 0 0 0 0  Anxiety Difficulty Not difficult at all Not difficult at all Not difficult at all -    BP Readings from Last 3 Encounters:  01/07/22 130/80  10/18/21 122/80  07/02/21 124/70    Physical Exam Vitals and nursing note reviewed.  Constitutional:      General: She is not irritable.    Appearance: She is well-developed.  HENT:     Head: Normocephalic.     Right Ear: Tympanic membrane and external ear normal.     Left Ear: Tympanic membrane and external ear normal.     Nose: Nose normal. No congestion or rhinorrhea.  Eyes:     General: Lids are everted, no foreign bodies appreciated. No scleral icterus.       Left eye: No foreign body or hordeolum.     Conjunctiva/sclera: Conjunctivae normal.     Right  eye: Right conjunctiva is not injected.     Left eye: Left conjunctiva is not injected.     Pupils: Pupils are equal, round, and reactive to light.  Neck:     Thyroid: No thyromegaly.     Vascular: No JVD.     Trachea: No tracheal deviation.  Cardiovascular:     Rate and Rhythm: Normal rate and regular rhythm.     Heart sounds: Normal heart sounds. No murmur heard.   No friction rub. No gallop.  Pulmonary:     Effort: Pulmonary effort is normal. No respiratory distress.     Breath sounds: Normal breath sounds. No wheezing, rhonchi or rales.  Chest:     Chest wall: No tenderness.  Abdominal:     General: Bowel sounds are normal.     Palpations: Abdomen is soft. There is no mass.     Tenderness: There is no abdominal tenderness. There is no guarding or rebound.  Musculoskeletal:        General: No tenderness. Normal range of motion.     Cervical back: Normal range of motion and neck supple.  Lymphadenopathy:     Cervical: No cervical adenopathy.  Skin:    General: Skin is warm.     Findings: No rash.  Neurological:     Mental Status: She is alert and oriented to person, place, and time.     Cranial Nerves: No cranial nerve deficit.     Deep Tendon Reflexes: Reflexes normal.  Psychiatric:        Mood and Affect: Mood is not anxious or depressed.    Wt Readings from Last 3 Encounters:  01/07/22 126 lb (57.2 kg)  10/18/21 129 lb (58.5 kg)  07/02/21 127 lb (57.6 kg)    BP 130/80    Pulse 64    Ht 5\' 9"  (1.753 m)    Wt 126 lb (57.2 kg)    BMI 18.61 kg/m   Assessment and Plan:  1. Essential hypertension Chronic.  Controlled.  Stable.  Blood pressure today is excellent range of 130/80.  Continue carvedilol 3.125 twice a day and losartan 50 mg once a day.  We will obtain a CMP for electrolytes and GFR.  We will recheck patient in 6 months. - carvedilol (COREG) 3.125 MG tablet; Take 1 tablet (3.125 mg total) by mouth 2 (two) times daily with a meal.  Dispense: 180 tablet;  Refill: 1 - losartan (COZAAR) 50 MG tablet; Take 1 tablet (50 mg total) by mouth daily at 6 (  six) AM.  Dispense: 90 tablet; Refill: 1 - Comprehensive Metabolic Panel (CMET)  2. Hyperlipidemia, unspecified hyperlipidemia type .  Controlled.  Stable.  Continue atorvastatin 10 mg once a day.  Will check lipid panel. - atorvastatin (LIPITOR) 10 MG tablet; Take 1 tablet (10 mg total) by mouth daily.  Dispense: 90 tablet; Refill: 1 - Lipid Panel With LDL/HDL Ratio  3. Mild intermittent asthma with acute exacerbation Chronic.  Controlled.  Stable.  Controlled on Singulair 10 mg once a day. - montelukast (SINGULAIR) 10 MG tablet; Take 1 tablet (10 mg total) by mouth daily.  Dispense: 90 tablet; Refill: 1  4. Major depressive disorder in partial remission, unspecified whether recurrent ([place]) 1.  Controlled.  Stable.  PHQ 0 gad score is 0 sertraline will be continued 100 mg once a day. - sertraline (ZOLOFT) 100 MG tablet; Take 1 tablet (100 mg total) by mouth daily.  Dispense: 90 tablet; Refill: 1  5. Acute non-recurrent maxillary sinusitis New onset.  Persistent.  Patient has had productive sinus drainage for several days with exam consistent with maxillary sinusitis.  We will initiate azithromycin to 50 mg 2 today followed by 1 a day for 4 days. - azithromycin (ZITHROMAX) 250 MG tablet; Take 2 tablets on day 1, then 1 tablet daily on days 2 through 5  Dispense: 6 tablet; Refill: 0

## 2022-01-08 LAB — LIPID PANEL WITH LDL/HDL RATIO
Cholesterol, Total: 152 mg/dL (ref 100–199)
HDL: 69 mg/dL (ref >39)
LDL Chol Calc (NIH): 67 mg/dL (ref 0–99)
LDL/HDL Ratio: 1 ratio (ref 0.0–3.2)
Triglycerides: 83 mg/dL (ref 0–149)
VLDL Cholesterol Cal: 16 mg/dL (ref 5–40)

## 2022-01-08 LAB — COMPREHENSIVE METABOLIC PANEL
ALT: 11 IU/L (ref 0–32)
AST: 15 IU/L (ref 0–40)
Albumin/Globulin Ratio: 1.8 (ref 1.2–2.2)
Albumin: 4.4 g/dL (ref 3.8–4.8)
Alkaline Phosphatase: 87 IU/L (ref 44–121)
BUN/Creatinine Ratio: 15 (ref 12–28)
BUN: 13 mg/dL (ref 8–27)
Bilirubin Total: <0.2 mg/dL (ref 0.0–1.2)
CO2: 22 mmol/L (ref 20–29)
Calcium: 9.5 mg/dL (ref 8.7–10.3)
Chloride: 106 mmol/L (ref 96–106)
Creatinine, Ser: 0.89 mg/dL (ref 0.57–1.00)
Globulin, Total: 2.4 g/dL (ref 1.5–4.5)
Glucose: 93 mg/dL (ref 70–99)
Potassium: 5 mmol/L (ref 3.5–5.2)
Sodium: 143 mmol/L (ref 134–144)
Total Protein: 6.8 g/dL (ref 6.0–8.5)
eGFR: 70 mL/min/{1.73_m2} (ref >59)

## 2022-02-11 ENCOUNTER — Ambulatory Visit (INDEPENDENT_AMBULATORY_CARE_PROVIDER_SITE_OTHER): Payer: Medicare Other

## 2022-02-11 DIAGNOSIS — Z122 Encounter for screening for malignant neoplasm of respiratory organs: Secondary | ICD-10-CM | POA: Diagnosis not present

## 2022-02-11 DIAGNOSIS — Z0000 Encounter for general adult medical examination without abnormal findings: Secondary | ICD-10-CM | POA: Diagnosis not present

## 2022-02-11 DIAGNOSIS — Z1231 Encounter for screening mammogram for malignant neoplasm of breast: Secondary | ICD-10-CM | POA: Diagnosis not present

## 2022-02-11 DIAGNOSIS — Z780 Asymptomatic menopausal state: Secondary | ICD-10-CM

## 2022-02-11 NOTE — Patient Instructions (Signed)
Marilyn Molina , Thank you for taking time to come for your Medicare Wellness Visit. I appreciate your ongoing commitment to your health goals. Please review the following plan we discussed and let me know if I can assist you in the future.   Screening recommendations/referrals: Colonoscopy: done 08/31/13. Repeat 08/2023 Mammogram: done 02/07/21. Please call [phone] to schedule your mammogram and bone density screening.  Bone Density: done 01/05/20 Recommended yearly ophthalmology/optometry visit for glaucoma screening and checkup Recommended yearly dental visit for hygiene and checkup  Vaccinations: Influenza vaccine: done 10/31/21 Pneumococcal vaccine: done 04/08/19 Tdap vaccine: due Shingles vaccine: Shingrix discussed. Please contact your pharmacy for coverage information.  Covid-19: done 12/30/19, 01/25/20, 10/03/20 & 10/31/21  Advanced directives: Please bring a copy of your health care power of attorney and living will to the office at your convenience.   Conditions/risks identified: If you wish to quit smoking, help is available. For free tobacco cessation program offerings call the [facility] at [phone] or Live Well Line at (716) 703-9189. You may also visit [email or web address] or email [email or web address] for more information on other programs.   Next appointment: Follow up in one year for your annual wellness visit    Preventive Care 65 Years and Older, Female Preventive care refers to lifestyle choices and visits with your health care provider that can promote health and wellness. What does preventive care include? A yearly physical exam. This is also called an annual well check. Dental exams once or twice a year. Routine eye exams. Ask your health care provider how often you should have your eyes checked. Personal lifestyle choices, including: Daily care of your teeth and gums. Regular physical activity. Eating a healthy diet. Avoiding tobacco and  drug use. Limiting alcohol use. Practicing safe sex. Taking low-dose aspirin every day. Taking vitamin and mineral supplements as recommended by your health care provider. What happens during an annual well check? The services and screenings done by your health care provider during your annual well check will depend on your age, overall health, lifestyle risk factors, and family history of disease. Counseling  Your health care provider may ask you questions about your: Alcohol use. Tobacco use. Drug use. Emotional well-being. Home and relationship well-being. Sexual activity. Eating habits. History of falls. Memory and ability to understand (cognition). Work and work Astronomer. Reproductive health. Screening  You may have the following tests or measurements: Height, weight, and BMI. Blood pressure. Lipid and cholesterol levels. These may be checked every 5 years, or more frequently if you are over 93 years old. Skin check. Lung cancer screening. You may have this screening every year starting at age 36 if you have a 30-pack-year history of smoking and currently smoke or have quit within the past 15 years. Fecal occult blood test (FOBT) of the stool. You may have this test every year starting at age 13. Flexible sigmoidoscopy or colonoscopy. You may have a sigmoidoscopy every 5 years or a colonoscopy every 10 years starting at age 43. Hepatitis C blood test. Hepatitis B blood test. Sexually transmitted disease (STD) testing. Diabetes screening. This is done by checking your blood sugar (glucose) after you have not eaten for a while (fasting). You may have this done every 1-3 years. Bone density scan. This is done to screen for osteoporosis. You may have this done starting at age 61. Mammogram. This may be done every 1-2 years. Talk to your health care provider about how often you should have regular mammograms. Talk with your  health care provider about your test results, treatment  options, and if necessary, the need for more tests. Vaccines  Your health care provider may recommend certain vaccines, such as: Influenza vaccine. This is recommended every year. Tetanus, diphtheria, and acellular pertussis (Tdap, Td) vaccine. You may need a Td booster every 10 years. Zoster vaccine. You may need this after age 61. Pneumococcal 13-valent conjugate (PCV13) vaccine. One dose is recommended after age 33. Pneumococcal polysaccharide (PPSV23) vaccine. One dose is recommended after age 71. Talk to your health care provider about which screenings and vaccines you need and how often you need them. This information is not intended to replace advice given to you by your health care provider. Make sure you discuss any questions you have with your health care provider. Document Released: 12/15/2015 Document Revised: 08/07/2016 Document Reviewed: 09/19/2015 Elsevier Interactive Patient Education  2017 [place] Prevention in the Home Falls can cause injuries. They can happen to people of all ages. There are many things you can do to make your home safe and to help prevent falls. What can I do on the outside of my home? Regularly fix the edges of walkways and driveways and fix any cracks. Remove anything that might make you trip as you walk through a door, such as a raised step or threshold. Trim any bushes or trees on the path to your home. Use bright outdoor lighting. Clear any walking paths of anything that might make someone trip, such as rocks or tools. Regularly check to see if handrails are loose or broken. Make sure that both sides of any steps have handrails. Any raised decks and porches should have guardrails on the edges. Have any leaves, snow, or ice cleared regularly. Use sand or salt on walking paths during winter. Clean up any spills in your garage right away. This includes oil or grease spills. What can I do in the bathroom? Use night lights. Install grab  bars by the toilet and in the tub and shower. Do not use towel bars as grab bars. Use non-skid mats or decals in the tub or shower. If you need to sit down in the shower, use a plastic, non-slip stool. Keep the floor dry. Clean up any water that spills on the floor as soon as it happens. Remove soap buildup in the tub or shower regularly. Attach bath mats securely with double-sided non-slip rug tape. Do not have throw rugs and other things on the floor that can make you trip. What can I do in the bedroom? Use night lights. Make sure that you have a light by your bed that is easy to reach. Do not use any sheets or blankets that are too big for your bed. They should not hang down onto the floor. Have a firm chair that has side arms. You can use this for support while you get dressed. Do not have throw rugs and other things on the floor that can make you trip. What can I do in the kitchen? Clean up any spills right away. Avoid walking on wet floors. Keep items that you use a lot in easy-to-reach places. If you need to reach something above you, use a strong step stool that has a grab bar. Keep electrical cords out of the way. Do not use floor polish or wax that makes floors slippery. If you must use wax, use non-skid floor wax. Do not have throw rugs and other things on the floor that can make you trip. What  can I do with my stairs? Do not leave any items on the stairs. Make sure that there are handrails on both sides of the stairs and use them. Fix handrails that are broken or loose. Make sure that handrails are as long as the stairways. Check any carpeting to make sure that it is firmly attached to the stairs. Fix any carpet that is loose or worn. Avoid having throw rugs at the top or bottom of the stairs. If you do have throw rugs, attach them to the floor with carpet tape. Make sure that you have a light switch at the top of the stairs and the bottom of the stairs. If you do not have them,  ask someone to add them for you. What else can I do to help prevent falls? Wear shoes that: Do not have high heels. Have rubber bottoms. Are comfortable and fit you well. Are closed at the toe. Do not wear sandals. If you use a stepladder: Make sure that it is fully opened. Do not climb a closed stepladder. Make sure that both sides of the stepladder are locked into place. Ask someone to hold it for you, if possible. Clearly mark and make sure that you can see: Any grab bars or handrails. First and last steps. Where the edge of each step is. Use tools that help you move around (mobility aids) if they are needed. These include: Canes. Walkers. Scooters. Crutches. Turn on the lights when you go into a dark area. Replace any light bulbs as soon as they burn out. Set up your furniture so you have a clear path. Avoid moving your furniture around. If any of your floors are uneven, fix them. If there are any pets around you, be aware of where they are. Review your medicines with your doctor. Some medicines can make you feel dizzy. This can increase your chance of falling. Ask your doctor what other things that you can do to help prevent falls. This information is not intended to replace advice given to you by your health care provider. Make sure you discuss any questions you have with your health care provider. Document Released: 09/14/2009 Document Revised: 04/25/2016 Document Reviewed: 12/23/2014 Elsevier Interactive Patient Education  2017 [employer].

## 2022-02-11 NOTE — Progress Notes (Cosign Needed)
Subjective:   Marilyn Molina is a 71 y.o. female who presents for Medicare Annual (Subsequent) preventive examination.  Virtual Visit via Telephone Note  I connected with  Marilyn Molina on 02/11/22 at 11:20 AM EDT by telephone and verified that I am speaking with the correct person using two identifiers.  Location: Patient: home Provider: [facility] Persons participating in the virtual visit: [place]   I discussed the limitations, risks, security and privacy concerns of performing an evaluation and management service by telephone and the availability of in person appointments. The patient expressed understanding and agreed to proceed.  Interactive audio and video telecommunications were attempted between this nurse and patient, however failed, due to patient having technical difficulties OR patient did not have access to video capability.  We continued and completed visit with audio only.  Some vital signs may be absent or patient reported.   Clemetine Marker, LPN   Review of Systems     Cardiac Risk Factors include: advanced age (>24men, >41 women);smoking/ tobacco exposure;dyslipidemia;hypertension     Objective:    There were no vitals filed for this visit. There is no height or weight on file to calculate BMI.  Advanced Directives 02/11/2022 01/01/2021 12/27/2019 07/11/2015  Does Patient Have a Medical Advance Directive? Yes Yes No No  Type of Paramedic of [place];Living will [place];Living will - -  Copy of [place] in Chart? No - copy requested No - copy requested - -  Would patient like information on creating a medical advance directive? - - No - Patient declined No - patient declined information    Current Medications (verified) Outpatient Encounter Medications as of 02/11/2022  Medication Sig   atorvastatin (LIPITOR) 10 MG tablet Take 1 tablet (10 mg total) by mouth daily.    Calcium Carbonate-Vit D-Min (CALCIUM 1200 PO) Take 1 tablet by mouth daily.   carvedilol (COREG) 3.125 MG tablet Take 1 tablet (3.125 mg total) by mouth 2 (two) times daily with a meal.   cholecalciferol (VITAMIN D3) 25 MCG (1000 UNIT) tablet Take 1,000 Units by mouth daily.   losartan (COZAAR) 50 MG tablet Take 1 tablet (50 mg total) by mouth daily at 6 (six) AM.   montelukast (SINGULAIR) 10 MG tablet Take 1 tablet (10 mg total) by mouth daily.   Multiple Vitamin (MULTI-VITAMIN) tablet Take 1 tablet by mouth daily.   sertraline (ZOLOFT) 100 MG tablet Take 1 tablet (100 mg total) by mouth daily.   vitamin B-12 (CYANOCOBALAMIN) 1000 MCG tablet Take 1,000 mcg by mouth daily.   No facility-administered encounter medications on file as of 02/11/2022.    Allergies (verified) Patient has no known allergies.   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   High cholesterol    Hypertension    Past Surgical History:  Procedure Laterality Date   OVARIAN CYST REMOVAL     Family History  Problem Relation Age of Onset   Supraventricular tachycardia Mother    Stroke Father    Breast cancer Neg Hx    Social History   Socioeconomic History   Marital status: Widowed    Spouse name: Not on file   Number of children: 2   Years of education: Not on file   Highest education level: Not on file  Occupational History    Comment: retired  Tobacco Use   Smoking  status: Every Day    Packs/day: 1.00    Years: 47.00    Pack years: 47.00    Types: Cigarettes   Smokeless tobacco: Never  Vaping Use   Vaping Use: Never used  Substance and Sexual Activity   Alcohol use: No   Drug use: No   Sexual activity: Never  Other Topics Concern   Not on file  Social History Narrative   Pt's son and granddaughter live with her.    Social Determinants of Health   Financial Resource Strain: Low Risk    Difficulty of Paying Living Expenses: Not hard  at all  Food Insecurity: No Food Insecurity   Worried About Programme researcher, broadcasting/film/video in the Last Year: Never true   Ran Out of Food in the Last Year: Never true  Transportation Needs: No Transportation Needs   Lack of Transportation (Medical): No   Lack of Transportation (Non-Medical): No  Physical Activity: Inactive   Days of Exercise per Week: 0 days   Minutes of Exercise per Session: 0 min  Stress: No Stress Concern Present   Feeling of Stress : Not at all  Social Connections: Socially Isolated   Frequency of Communication with Friends and Family: More than three times a week   Frequency of Social Gatherings with Friends and Family: More than three times a week   Attends Religious Services: Never   Database administrator or Organizations: No   Attends Banker Meetings: Never   Marital Status: Widowed    Tobacco Counseling Ready to quit: No Counseling given: Not Answered   Clinical Intake:  Pre-visit preparation completed: Yes  Pain : No/denies pain     Nutritional Risks: None Diabetes: No  How often do you need to have someone help you when you read instructions, pamphlets, or other written materials from your doctor or pharmacy?: 1 - Never    Interpreter Needed?: No  Information entered by :: Reather Littler LPN   Activities of Daily Living In your present state of health, do you have any difficulty performing the following activities: 02/11/2022 04/26/2021  Hearing? N N  Vision? N N  Difficulty concentrating or making decisions? N N  Walking or climbing stairs? N N  Dressing or bathing? N N  Doing errands, shopping? N N  Preparing Food and eating ? N -  Using the Toilet? N -  In the past six months, have you accidently leaked urine? N -  Do you have problems with loss of bowel control? N -  Managing your Medications? N -  Managing your Finances? N -  Housekeeping or managing your Housekeeping? N -  Some recent data might be hidden    Patient Care  Team: Duanne Limerick, MD as PCP - General (Family Medicine)  Indicate any recent Medical Services you may have received from other than [facility] providers in the past year (date may be approximate).     Assessment:   This is a routine wellness examination for Marilyn Molina.  Hearing/Vision screen Hearing Screening - Comments:: Pt declines hearing difficulty Vision Screening - Comments:: Annual vision screenings with Dr. Dewaine Conger  Dietary issues and exercise activities discussed: Current Exercise Habits: The patient does not participate in regular exercise at present, Exercise limited by: None identified   Goals Addressed             This Visit's Progress    Increase physical activity   Not on track    Recommend increasing physical activity  to at least 3 days per week.        Depression Screen PHQ 2/9 Scores 02/11/2022 01/07/2022 07/02/2021 04/26/2021 01/10/2021 01/01/2021 09/06/2020  PHQ - 2 Score 0 0 0 0 0 0 0  PHQ- 9 Score 1 0 - 0 0 - 0    Fall Risk Fall Risk  02/11/2022 07/02/2021 04/26/2021 01/01/2021 09/06/2020  Falls in the past year? 0 0 0 0 0  Number falls in past yr: 0 0 0 0 -  Injury with Fall? 0 0 0 0 -  Risk for fall due to : No Fall Risks No Fall Risks - No Fall Risks -  Follow up Falls prevention discussed Falls evaluation completed Falls evaluation completed Falls prevention discussed Falls evaluation completed    [place]:  Any stairs in or around the home? Yes If so, are there any without handrails? No  Home free of loose throw rugs in walkways, pet beds, electrical cords, etc? Yes  Adequate lighting in your home to reduce risk of falls? Yes   ASSISTIVE DEVICES UTILIZED TO PREVENT FALLS:  Life alert? No  Use of a cane, walker or w/c? No  Grab bars in the bathroom? No  Shower chair or bench in shower? Yes  Elevated toilet seat or a handicapped toilet? No   TIMED UP AND GO:  Was the test performed? No . Telephonic visit.   Cognitive  Function: Normal cognitive status assessed by direct observation by this Nurse Health Advisor. No abnormalities found.       6CIT Screen 12/27/2019  What Year? 0 points  What month? 0 points  What time? 0 points  Count back from 20 0 points  Months in reverse 0 points  Repeat phrase 0 points  Total Score 0    Immunizations Immunization History  Administered Date(s) Administered   Fluad Quad(high Dose 65+) 09/06/2020, 10/31/2021   Influenza, High Dose Seasonal PF 10/08/2018   Influenza, Quadrivalent, Recombinant, Inj, Pf 09/20/2019   Influenza, Seasonal, Injecte, Preservative Fre 11/22/2014   Influenza,inj,Quad PF,6+ Mos 11/21/2015, 08/20/2017   Influenza-Unspecified 09/02/2013, 08/20/2017, 08/03/2019   Moderna Covid-19 Vaccine Bivalent Booster 69yrs & up 10/31/2021   PFIZER(Purple Top)SARS-COV-2 Vaccination 12/30/2019, 01/25/2020, 10/03/2020   Pneumococcal Conjugate-13 01/01/2018   Pneumococcal Polysaccharide-23 04/08/2019    TDAP status: Due, Education has been provided regarding the importance of this vaccine. Advised may receive this vaccine at local pharmacy or Health Dept. Aware to provide a copy of the vaccination record if obtained from local pharmacy or Health Dept. Verbalized acceptance and understanding.  Flu Vaccine status: Up to date  Pneumococcal vaccine status: Up to date  Covid-19 vaccine status: Completed vaccines  Qualifies for Shingles Vaccine? Yes   Zostavax completed No   Shingrix Completed?: No.    Education has been provided regarding the importance of this vaccine. Patient has been advised to call insurance company to determine out of pocket expense if they have not yet received this vaccine. Advised may also receive vaccine at local pharmacy or Health Dept. Verbalized acceptance and understanding.  Screening Tests Health Maintenance  Topic Date Due   TETANUS/TDAP  Never done   Zoster Vaccines- Shingrix (1 of 2) 04/06/2022 (Originally 07/31/2002)    MAMMOGRAM  02/08/2023   COLONOSCOPY (Pts 45-106yrs Insurance coverage will need to be confirmed)  09/01/2023   Pneumonia Vaccine 6+ Years old  Completed   INFLUENZA VACCINE  Completed   DEXA SCAN  Completed   COVID-19 Vaccine  Completed  Hepatitis C Screening  Completed   HPV VACCINES  Aged Out    Health Maintenance  Health Maintenance Due  Topic Date Due   TETANUS/TDAP  Never done    Colorectal cancer screening: Type of screening: Colonoscopy. Completed 08/31/13. Repeat every 10 years  Mammogram status: Completed 02/07/21. Repeat every year. Ordered today.   Bone Density status: Completed 01/05/20. Results reflect: Bone density results: OSTEOPOROSIS. Repeat every 2 years. Ordered today.   Lung Cancer Screening: (Low Dose CT Chest recommended if Age 72-80 years, 30 pack-year currently smoking OR have quit w/in 15years.) does qualify. Completed 11/03/2019  Lung Cancer Screening Referral: A referral has been sent to [facility] Pulmonary Lung Cancer Screening regarding the possible need for this exam. The patient's chart will be reviewed to determine if they qualify and the patient will be contacted to facilitate the scheduling of the Low Dose Chest CT for lung cancer screening.    Additional Screening:  Hepatitis C Screening: does qualify; Completed 01/01/18  Vision Screening: Recommended annual ophthalmology exams for early detection of glaucoma and other disorders of the eye. Is the patient up to date with their annual eye exam?  Yes  Who is the provider or what is the name of the office in which the patient attends annual eye exams? Dr. Mallie Mussel.   Dental Screening: Recommended annual dental exams for proper oral hygiene  Community Resource Referral / Chronic Care Management: CRR required this visit?  No   CCM required this visit?  No      Plan:     I have personally reviewed and noted the following in the patients chart:   Medical and social history Use of alcohol, tobacco  or illicit drugs  Current medications and supplements including opioid prescriptions.  Functional ability and status Nutritional status Physical activity Advanced directives List of other physicians Hospitalizations, surgeries, and ER visits in previous 12 months Vitals Screenings to include cognitive, depression, and falls Referrals and appointments  In addition, I have reviewed and discussed with patient certain preventive protocols, quality metrics, and best practice recommendations. A written personalized care plan for preventive services as well as general preventive health recommendations were provided to patient.     Clemetine Marker, LPN   [id number]   Nurse Notes: none

## 2022-02-19 ENCOUNTER — Telehealth: Payer: Self-pay

## 2022-02-19 NOTE — Telephone Encounter (Signed)
Called with mammo and bone density in [place] on May 2 at 830 ?

## 2022-03-06 DIAGNOSIS — Z79899 Other long term (current) drug therapy: Secondary | ICD-10-CM | POA: Diagnosis not present

## 2022-03-06 DIAGNOSIS — R9431 Abnormal electrocardiogram [ECG] [EKG]: Secondary | ICD-10-CM | POA: Diagnosis not present

## 2022-03-06 DIAGNOSIS — R931 Abnormal findings on diagnostic imaging of heart and coronary circulation: Secondary | ICD-10-CM | POA: Diagnosis not present

## 2022-03-06 DIAGNOSIS — D72829 Elevated white blood cell count, unspecified: Secondary | ICD-10-CM | POA: Diagnosis not present

## 2022-03-06 DIAGNOSIS — F17210 Nicotine dependence, cigarettes, uncomplicated: Secondary | ICD-10-CM | POA: Diagnosis not present

## 2022-03-06 DIAGNOSIS — Z8616 Personal history of COVID-19: Secondary | ICD-10-CM | POA: Diagnosis not present

## 2022-03-06 DIAGNOSIS — I429 Cardiomyopathy, unspecified: Secondary | ICD-10-CM | POA: Diagnosis not present

## 2022-03-06 DIAGNOSIS — I509 Heart failure, unspecified: Secondary | ICD-10-CM | POA: Diagnosis not present

## 2022-03-06 DIAGNOSIS — I517 Cardiomegaly: Secondary | ICD-10-CM | POA: Diagnosis not present

## 2022-03-06 DIAGNOSIS — F17200 Nicotine dependence, unspecified, uncomplicated: Secondary | ICD-10-CM | POA: Diagnosis not present

## 2022-03-20 DIAGNOSIS — I429 Cardiomyopathy, unspecified: Secondary | ICD-10-CM | POA: Diagnosis not present

## 2022-03-28 ENCOUNTER — Other Ambulatory Visit: Payer: Self-pay

## 2022-03-28 ENCOUNTER — Telehealth: Payer: Self-pay

## 2022-03-28 DIAGNOSIS — Z87891 Personal history of nicotine dependence: Secondary | ICD-10-CM

## 2022-03-28 DIAGNOSIS — F17210 Nicotine dependence, cigarettes, uncomplicated: Secondary | ICD-10-CM

## 2022-03-28 DIAGNOSIS — Z122 Encounter for screening for malignant neoplasm of respiratory organs: Secondary | ICD-10-CM

## 2022-03-28 NOTE — Telephone Encounter (Signed)
Left voicemail with call back number for patient to call to schedule annual LDCT  

## 2022-04-02 ENCOUNTER — Ambulatory Visit
Admission: RE | Admit: 2022-04-02 | Discharge: 2022-04-02 | Disposition: A | Discharge status: Home / Self Care | Payer: Medicare Other | Source: Ambulatory Visit | Attending: Family Medicine | Admitting: Family Medicine

## 2022-04-02 DIAGNOSIS — Z1231 Encounter for screening mammogram for malignant neoplasm of breast: Secondary | ICD-10-CM | POA: Insufficient documentation

## 2022-04-02 DIAGNOSIS — Z780 Asymptomatic menopausal state: Secondary | ICD-10-CM | POA: Insufficient documentation

## 2022-04-02 DIAGNOSIS — M8588 Other specified disorders of bone density and structure, other site: Secondary | ICD-10-CM | POA: Diagnosis not present

## 2022-04-02 DIAGNOSIS — M810 Age-related osteoporosis without current pathological fracture: Secondary | ICD-10-CM | POA: Diagnosis not present

## 2022-04-03 ENCOUNTER — Other Ambulatory Visit: Payer: Self-pay

## 2022-04-03 DIAGNOSIS — M816 Localized osteoporosis [Lequesne]: Secondary | ICD-10-CM

## 2022-04-03 MED ORDER — ALENDRONATE SODIUM 70 MG PO TABS: 70.0000 mg | ORAL_TABLET | ORAL | 1 refills | Status: DC

## 2022-04-10 ENCOUNTER — Ambulatory Visit
Admission: RE | Admit: 2022-04-10 | Discharge: 2022-04-10 | Disposition: A | Discharge status: Home / Self Care | Payer: Medicare Other | Source: Ambulatory Visit | Attending: Acute Care | Admitting: Acute Care

## 2022-04-10 DIAGNOSIS — Z122 Encounter for screening for malignant neoplasm of respiratory organs: Secondary | ICD-10-CM | POA: Diagnosis not present

## 2022-04-10 DIAGNOSIS — Z87891 Personal history of nicotine dependence: Secondary | ICD-10-CM | POA: Insufficient documentation

## 2022-04-10 DIAGNOSIS — F17210 Nicotine dependence, cigarettes, uncomplicated: Secondary | ICD-10-CM | POA: Insufficient documentation

## 2022-04-11 ENCOUNTER — Encounter: Payer: Self-pay | Admitting: Family Medicine

## 2022-04-11 ENCOUNTER — Ambulatory Visit (INDEPENDENT_AMBULATORY_CARE_PROVIDER_SITE_OTHER): Payer: Medicare Other | Admitting: Family Medicine

## 2022-04-11 VITALS — BP 128/80 | HR 78 | Ht 69.0 in | Wt 127.0 lb

## 2022-04-11 DIAGNOSIS — J432 Centrilobular emphysema: Secondary | ICD-10-CM | POA: Insufficient documentation

## 2022-04-11 DIAGNOSIS — J0100 Acute maxillary sinusitis, unspecified: Secondary | ICD-10-CM | POA: Diagnosis not present

## 2022-04-11 DIAGNOSIS — I700 Atherosclerosis of aorta: Secondary | ICD-10-CM | POA: Insufficient documentation

## 2022-04-11 MED ORDER — AMOXICILLIN-POT CLAVULANATE 875-125 MG PO TABS: 1.0000 | ORAL_TABLET | Freq: Two times a day (BID) | ORAL | 1 refills | Status: DC

## 2022-04-11 NOTE — Progress Notes (Signed)
? ? ?Date:  04/11/2022  ? ?Name:  Marilyn Molina   DOB:  Nov 22, 1951   MRN:  [id number] ? ? ?Chief Complaint: Sinusitis (Yellow production, gums/teeth hurting) ? ?Sinusitis ?This is a new problem. The current episode started in the past 7 days. The problem has been gradually worsening since onset. There has been no fever. The pain is mild. Pertinent negatives include no chills, congestion, coughing, diaphoresis, ear pain, headaches, hoarse voice, neck pain, shortness of breath, sinus pressure, sneezing, sore throat or swollen glands. Past treatments include nothing. The treatment provided moderate relief.  ? ?Lab Results  ?Component Value Date  ? NA 143 01/07/2022  ? K 5.0 01/07/2022  ? CO2 22 01/07/2022  ? GLUCOSE 93 01/07/2022  ? BUN 13 01/07/2022  ? CREATININE 0.89 01/07/2022  ? CALCIUM 9.5 01/07/2022  ? EGFR 70 01/07/2022  ? GFRNONAA 84 01/10/2021  ? ?Lab Results  ?Component Value Date  ? CHOL 152 01/07/2022  ? HDL 69 01/07/2022  ? [place] 67 01/07/2022  ? TRIG 83 01/07/2022  ? CHOLHDL 2.9 10/08/2018  ? ?Lab Results  ?Component Value Date  ? TSH 1.430 01/10/2021  ? ?No results found for: HGBA1C ?Lab Results  ?Component Value Date  ? WBC 10.9 (H) 01/10/2021  ? HGB 13.7 01/10/2021  ? HCT 40.5 01/10/2021  ? MCV 96 01/10/2021  ? PLT 389 01/10/2021  ? ?Lab Results  ?Component Value Date  ? ALT 11 01/07/2022  ? AST 15 01/07/2022  ? ALKPHOS 87 01/07/2022  ? BILITOT <0.2 01/07/2022  ? ?No results found for: 25OHVITD2, [place], VD25OH  ? ?Review of Systems  ?Constitutional:  Negative for chills, diaphoresis and fever.  ?HENT:  Negative for congestion, drooling, ear discharge, ear pain, hoarse voice, sinus pressure, sneezing and sore throat.   ?Respiratory:  Negative for cough, shortness of breath and wheezing.   ?Cardiovascular:  Negative for chest pain, palpitations and leg swelling.  ?Gastrointestinal:  Negative for abdominal pain, blood in stool, constipation, diarrhea and nausea.  ?Endocrine: Negative for  polydipsia.  ?Genitourinary:  Negative for dysuria, frequency, hematuria and urgency.  ?Musculoskeletal:  Negative for back pain, myalgias and neck pain.  ?Skin:  Negative for rash.  ?Allergic/Immunologic: Negative for environmental allergies.  ?Neurological:  Negative for dizziness and headaches.  ?Hematological:  Does not bruise/bleed easily.  ?Psychiatric/Behavioral:  Negative for suicidal ideas. The patient is not nervous/anxious.   ? ?Patient Active Problem List  ? Diagnosis Date Noted  ? Anxiety 11/21/2015  ? Essential hypertension 11/21/2015  ? Anxiety and depression 11/03/2012  ? Cardiomyopathy ([place]) 10/29/2011  ? Cigarette nicotine dependence without complication 66/29/4765  ? Tobacco abuse 10/29/2011  ? ? ?No Known Allergies ? ?Past Surgical History:  ?Procedure Laterality Date  ? OVARIAN CYST REMOVAL    ? ? ?Social History  ? ?Tobacco Use  ? Smoking status: Every Day  ?  Packs/day: 1.00  ?  Years: 47.00  ?  Pack years: 47.00  ?  Types: Cigarettes  ? Smokeless tobacco: Never  ?Vaping Use  ? Vaping Use: Never used  ?Substance Use Topics  ? Alcohol use: No  ? Drug use: No  ? ? ? ?Medication list has been reviewed and updated. ? ?Current Meds  ?Medication Sig  ? alendronate (FOSAMAX) 70 MG tablet Take 1 tablet (70 mg total) by mouth every 7 (seven) days. Take with a full glass of water on an empty stomach.  ? atorvastatin (LIPITOR) 10 MG tablet Take 1 tablet (10 mg  total) by mouth daily.  ? Calcium Carbonate-Vit D-Min (CALCIUM 1200 PO) Take 1 tablet by mouth daily.  ? carvedilol (COREG) 3.125 MG tablet Take 1 tablet (3.125 mg total) by mouth 2 (two) times daily with a meal. (Patient taking differently: Take 3.125 mg by mouth 2 (two) times daily with a meal. 2 pills BID/ [facility] at [facility])  ? cholecalciferol (VITAMIN D3) 25 MCG (1000 UNIT) tablet Take 1,000 Units by mouth daily.  ? ENTRESTO 24-26 MG Take 1 tablet by mouth 2 (two) times daily. [facility]  ? montelukast (SINGULAIR) 10 MG tablet Take 1 tablet  (10 mg total) by mouth daily.  ? Multiple Vitamin (MULTI-VITAMIN) tablet Take 1 tablet by mouth daily.  ? sertraline (ZOLOFT) 100 MG tablet Take 1 tablet (100 mg total) by mouth daily.  ? vitamin B-12 (CYANOCOBALAMIN) 1000 MCG tablet Take 1,000 mcg by mouth daily.  ? ? ? ?  04/11/2022  ? 11:31 AM 01/07/2022  ?  9:04 AM 07/02/2021  ?  8:30 AM 04/26/2021  ? 11:01 AM  ?GAD 7 : Generalized Anxiety Score  ?Nervous, Anxious, on Edge 0 0 0 0  ?Control/stop worrying 0 0 0 0  ?Worry too much - different things 0 0 0 0  ?Trouble relaxing 0 0 0 0  ?Restless 0 0 0 0  ?Easily annoyed or irritable 0 0 0 0  ?Afraid - awful might happen 0 0 0 0  ?Total GAD 7 Score 0 0 0 0  ?Anxiety Difficulty Not difficult at all Not difficult at all Not difficult at all Not difficult at all  ? ? ? ?  04/11/2022  ? 11:31 AM  ?Depression screen PHQ 2/9  ?Decreased Interest 0  ?Down, Depressed, Hopeless 0  ?PHQ - 2 Score 0  ?Altered sleeping 0  ?Tired, decreased energy 2  ?Change in appetite 1  ?Feeling bad or failure about yourself  0  ?Trouble concentrating 0  ?Moving slowly or fidgety/restless 0  ?Suicidal thoughts 0  ?PHQ-9 Score 3  ?Difficult doing work/chores Not difficult at all  ? ? ?BP Readings from Last 3 Encounters:  ?04/11/22 128/80  ?01/07/22 130/80  ?10/18/21 122/80  ? ? ?Physical Exam ?Vitals and nursing note reviewed.  ?Constitutional:   ?   Appearance: She is well-developed.  ?HENT:  ?   Head: Normocephalic.  ?   Jaw: There is normal jaw occlusion.  ?   Right Ear: Tympanic membrane and external ear normal.  ?   Left Ear: Tympanic membrane and external ear normal.  ?   Nose: No congestion or rhinorrhea.  ?   Right Sinus: Maxillary sinus tenderness present. No frontal sinus tenderness.  ?   Left Sinus: Maxillary sinus tenderness present. No frontal sinus tenderness.  ?   Mouth/Throat:  ?   Mouth: Mucous membranes are moist.  ?Eyes:  ?   General: Lids are everted, no foreign bodies appreciated. No scleral icterus.    ?   Left eye: No foreign  body or hordeolum.  ?   Conjunctiva/sclera: Conjunctivae normal.  ?   Right eye: Right conjunctiva is not injected.  ?   Left eye: Left conjunctiva is not injected.  ?   Pupils: Pupils are equal, round, and reactive to light.  ?Neck:  ?   Thyroid: No thyromegaly.  ?   Vascular: No JVD.  ?   Trachea: No tracheal deviation.  ?Cardiovascular:  ?   Rate and Rhythm: Normal rate and regular rhythm.  ?  Heart sounds: Normal heart sounds. No murmur heard. ?  No friction rub. No gallop.  ?Pulmonary:  ?   Effort: Pulmonary effort is normal. No respiratory distress.  ?   Breath sounds: Normal breath sounds. No wheezing, rhonchi or rales.  ?Abdominal:  ?   General: Bowel sounds are normal.  ?   Palpations: Abdomen is soft. There is no mass.  ?   Tenderness: There is no abdominal tenderness. There is no guarding or rebound.  ?Musculoskeletal:     ?   General: No tenderness. Normal range of motion.  ?   Cervical back: Normal range of motion and neck supple.  ?Lymphadenopathy:  ?   Cervical: No cervical adenopathy.  ?   Right cervical: No superficial, deep or posterior cervical adenopathy. ?   Left cervical: No superficial, deep or posterior cervical adenopathy.  ?Skin: ?   General: Skin is warm.  ?   Findings: No rash.  ?Neurological:  ?   Mental Status: She is alert and oriented to person, place, and time.  ?   Cranial Nerves: No cranial nerve deficit.  ?Psychiatric:     ?   Mood and Affect: Mood is not anxious or depressed.  ? ? ?Wt Readings from Last 3 Encounters:  ?04/11/22 127 lb (57.6 kg)  ?01/07/22 126 lb (57.2 kg)  ?10/18/21 129 lb (58.5 kg)  ? ? ?BP 128/80   Pulse 78   Ht _0  (1.753 m)   Wt 127 lb (57.6 kg)   BMI 18.75 kg/m?  ? ?Assessment and Plan: ? ?1. Acute maxillary sinusitis, recurrence not specified ?New onset.  Persistent.  Stable.  Exam and history is consistent with an acute sinusitis likely maxillary in nature.  We will treat with Augmentin 875 mg twice a day for 10 days. ?- amoxicillin-clavulanate  (AUGMENTIN) 875-125 MG tablet; Take 1 tablet by mouth 2 (two) times daily.  Dispense: 20 tablet; Refill: 1  ? ? ?

## 2022-04-12 ENCOUNTER — Telehealth: Payer: Self-pay | Admitting: Acute Care

## 2022-04-12 DIAGNOSIS — R911 Solitary pulmonary nodule: Secondary | ICD-10-CM

## 2022-04-12 DIAGNOSIS — F17210 Nicotine dependence, cigarettes, uncomplicated: Secondary | ICD-10-CM

## 2022-04-12 DIAGNOSIS — Z87891 Personal history of nicotine dependence: Secondary | ICD-10-CM

## 2022-04-12 NOTE — Telephone Encounter (Signed)
Lung Screening CT 04/10/22: ?IMPRESSION: ?1. New solid nodule of the right upper lobe with largest nodule ?measuring 7.2 mm. Lung-RADS 4AS, suspicious. Follow up low-dose ?chest CT without contrast in 3 months (please use the following ?order, "CT CHEST LCS NODULE FOLLOW-UP W/O CM") is recommended. ?Alternatively, PET may be considered when there is a solid component ?1mm or larger. S modifier for left renal lesion. ?2. Peripherally calcified cystic lesion of the left kidney, is ?unchanged in size but demonstrates a internal septation which was ?not seen on prior exam. Recommend renal protocol CT or MRI for ?further evaluation. ?3.  Aortic Atherosclerosis (ICD10-I70.0). ? ?I have reviewed the CT results with Dr Jayme Cloud. She has advised for pt to have a 3 mth nodule f/u CT scan and also an appt to see her to evaluate and discuss CT findings.  ?I have left pt a messge to call back to discuss.  ?

## 2022-04-16 ENCOUNTER — Telehealth: Payer: Self-pay

## 2022-04-16 NOTE — Telephone Encounter (Signed)
Spoke with patient by phone to review results of LDCT.   New nodule noted in right lung and radiology recommends a 3 month follow up scan to monitor behavior or growth.  This is a precautionary measure and recommended by our pulmonary provider also. Dr. Jayme Cloud also requested an appointment to review CT results and someone will be calling to schedule the appt.  There was also a change noted to the previous cyst on the left kidney.  Size is unchanged but appearance has changed.  Will fax results and a note to PCP to review and make further recommendations, as they determine is advised.  Patient acknowledged understanding and is agreeable to plan.  ?

## 2022-04-16 NOTE — Telephone Encounter (Signed)
Per Dr. Jayme Cloud via epic secure chat--patient will need f/u within 54mo to discuss recent CT. ?Lm for patient to offer appt on 05/21/2022. ? ? ?

## 2022-04-16 NOTE — Telephone Encounter (Signed)
Left another message for pt to call back to discuss recent CT results.  ?

## 2022-04-17 NOTE — Telephone Encounter (Signed)
Noted  

## 2022-04-25 ENCOUNTER — Encounter: Payer: Self-pay | Admitting: Pulmonary Disease

## 2022-04-25 ENCOUNTER — Ambulatory Visit: Payer: Medicare Other | Admitting: Pulmonary Disease

## 2022-04-25 VITALS — BP 128/74 | HR 68 | Temp 97.7°F | Ht 66.0 in | Wt 128.4 lb

## 2022-04-25 DIAGNOSIS — J449 Chronic obstructive pulmonary disease, unspecified: Secondary | ICD-10-CM | POA: Diagnosis not present

## 2022-04-25 DIAGNOSIS — F17210 Nicotine dependence, cigarettes, uncomplicated: Secondary | ICD-10-CM

## 2022-04-25 DIAGNOSIS — R911 Solitary pulmonary nodule: Secondary | ICD-10-CM | POA: Diagnosis not present

## 2022-04-25 DIAGNOSIS — J84115 Respiratory bronchiolitis interstitial lung disease: Secondary | ICD-10-CM | POA: Diagnosis not present

## 2022-04-25 NOTE — Progress Notes (Signed)
Subjective:    Patient ID: Marilyn Molina, female    DOB: February 08, 1951, 71 y.o.   MRN: [id number] Patient Care Team: Duanne Limerick, MD as PCP - [facility])  Chief Complaint  Patient presents with   pulmonary consult    Recent CT-no current sx.    HPI Patient is a 71 year old current smoker (1 PPD, 79 PY) with a history as noted below, presents for evaluation of abnormal low-dose CT chest for lung cancer screening.  Patient's primary provider is Dr. Elizabeth Sauer.  Patient was noted to have a new 7.2 mm nodule in the LEFT upper lobe 10 Apr 2022.  She is here to establish follow-up for this issue.  She will have a low-dose CT follow-up in 3 months time, this has already been ordered by the lung cancer screening program.  The patient has been asymptomatic in this regard.  She does not endorse any shortness of breath, cough, sputum production or hemoptysis.  He does not endorse any chest tightness.  No orthopnea or paroxysmal nocturnal dyspnea.  She stays [place].  She tends to minimize her symptoms.  She did have an issue with acute [place] sinusitis on 11 May and had to be treated with Augmentin 875 mg twice daily for 10 days.  Completed that course.  Review of her chest CT shows that she has findings consistent with respiratory bronchiolitis related to smoking.  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   Review of Systems A 10 point review of systems was performed and it is as noted above otherwise negative.  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   High cholesterol    Hypertension    Past Surgical History:  Procedure Laterality Date   OVARIAN CYST REMOVAL     Patient Active Problem List   Diagnosis Date Noted   Centrilobular emphysema (HCC) 04/11/2022   Aortic atherosclerosis (HCC) 04/11/2022   Anxiety 11/21/2015   Essential  hypertension 11/21/2015   Anxiety and depression 11/03/2012   Cardiomyopathy ([facility]) 10/29/2011   Cigarette nicotine dependence without complication 10/29/2011   Tobacco abuse 10/29/2011   Family History  Problem Relation Age of Onset   Supraventricular tachycardia Mother    Stroke Father    Breast cancer Neg Hx    Social History   Tobacco Use   Smoking status: Every Day    Packs/day: 1.00    Years: 51.00    Pack years: 51.00    Types: Cigarettes   Smokeless tobacco: Never   Tobacco comments:    1PPD 04/25/2022  Substance Use Topics   Alcohol use: No   No Known Allergies  Current Meds  Medication Sig   alendronate (FOSAMAX) 70 MG tablet Take 1 tablet (70 mg total) by mouth every 7 (seven) days. Take with a full glass of water on an empty stomach.   atorvastatin (LIPITOR) 10 MG tablet Take 1 tablet (10 mg total) by mouth daily.   Calcium Carbonate-Vit D-Min (CALCIUM 1200 PO) Take 1 tablet by mouth daily.   carvedilol (COREG) 3.125 MG tablet Take 1 tablet (3.125 mg total) by mouth 2 (two) times daily with a meal. (Patient taking differently: Take 3.125 mg by mouth 2 (two) times daily with a meal. 2 pills BID/ cardio at [facility])   cholecalciferol (VITAMIN D3) 25 MCG (1000 UNIT)  tablet Take 1,000 Units by mouth daily.   ENTRESTO 24-26 MG Take 1 tablet by mouth 2 (two) times daily. [facility]   montelukast (SINGULAIR) 10 MG tablet Take 1 tablet (10 mg total) by mouth daily.   Multiple Vitamin (MULTI-VITAMIN) tablet Take 1 tablet by mouth daily.   sertraline (ZOLOFT) 100 MG tablet Take 1 tablet (100 mg total) by mouth daily.   vitamin B-12 (CYANOCOBALAMIN) 1000 MCG tablet Take 1,000 mcg by mouth daily.   [DISCONTINUED] amoxicillin-clavulanate (AUGMENTIN) 875-125 MG tablet Take 1 tablet by mouth 2 (two) times daily.   Immunization History  Administered Date(s) Administered   Fluad Quad(high Dose 65+) 09/06/2020, 10/31/2021   Influenza, High Dose Seasonal PF 10/08/2018    Influenza, Quadrivalent, Recombinant, Inj, Pf 09/20/2019   Influenza, Seasonal, Injecte, Preservative Fre 11/22/2014   Influenza,inj,Quad PF,6+ Mos 11/21/2015, 08/20/2017   Influenza-Unspecified 09/02/2013, 08/20/2017, 08/03/2019   Moderna Covid-19 Vaccine Bivalent Booster 61yrs & up 10/31/2021   PFIZER(Purple Top)SARS-COV-2 Vaccination 12/30/2019, 01/25/2020, 10/03/2020   Pneumococcal Conjugate-13 01/01/2018   Pneumococcal Polysaccharide-23 04/08/2019      Objective:   Physical Exam BP 128/74 (BP Location: Left Arm, Cuff Size: Normal)   Pulse 68   Temp 97.7 F (36.5 C) (Temporal)   Ht 5\' 6"  (1.676 m)   Wt 128 lb 6.4 oz (58.2 kg)   SpO2 96%   BMI 20.72 kg/m  GENERAL: Well-developed, well-nourished woman, no acute distress.  Fully ambulatory.  No conversational dyspnea. HEAD: Normocephalic, atraumatic.  EYES: Pupils equal, round, reactive to light.  No scleral icterus.  MOUTH: Oral mucosa moist.  No thrush. NECK: Supple. No thyromegaly. Trachea midline. No JVD.  No adenopathy. PULMONARY: Good air entry bilaterally.  Coarse breath sounds otherwise, no adventitious sounds. CARDIOVASCULAR: S1 and S2. Regular rate and rhythm.  No rubs, murmurs or gallops heard. ABDOMEN: Benign. MUSCULOSKELETAL: No joint deformity, no clubbing, no edema.  NEUROLOGIC: No overt focal deficit, no gait disturbance, speech is fluent. SKIN: Intact,warm,dry. PSYCH: Mood and behavior normal.      Assessment & Plan:     ICD-10-CM   1. COPD suggested by initial evaluation [facility])  J44.9 Pulmonary Function Test [facility] Only   Will obtain PFTs to assess Recommend discontinuation of smoking    2. Lung nodule - 7.2 mm, LEFT upper lobe  R91.1    Short-term follow-up with LDCT 3 months, already ordered    3. Respiratory bronchiolitis interstitial lung disease (HCC)  J84.115 Pulmonary Function Test [facility] Only   Multiple small lung nodules, groundglass opacities Bronchial thickening Patient advised that smoking  cessation is key Suspect underlying RB ILD    4. Tobacco dependence due to cigarettes  F17.210    Patient was counseled regards discontinuation of smoking Not motivated to quit as of yet     Orders Placed This Encounter  Procedures   Pulmonary Function Test [facility] Only    Standing Status:   Future    Standing Expiration Date:   04/26/2023    Scheduling Instructions:     Within 62mo.    Order Specific Question:   Full PFT: includes the following: basic spirometry, spirometry pre & post bronchodilator, diffusion capacity (DLCO), lung volumes    Answer:   Full PFT   We will see the patient in follow-up in 2 to 3 months time she is to contact 0mo prior to that time should any new difficulties arise.  Follow-up CT has already been ordered by the lung cancer screening program.  C. [place], MD  Advanced Bronchoscopy PCCM [facility]    *This note was dictated using voice recognition software/Dragon.  Despite best efforts to proofread, errors can occur which can change the meaning. Any transcriptional errors that result from this process are unintentional and may not be fully corrected at the time of dictation.

## 2022-04-25 NOTE — Patient Instructions (Signed)
We are going to get a breathing test.  This will be done closer to your follow-up visit  We will see you in follow-up in 2 to 3 months time.  We will continue to follow the findings on your CT chest.

## 2022-07-08 ENCOUNTER — Encounter: Payer: Self-pay | Admitting: Family Medicine

## 2022-07-08 ENCOUNTER — Other Ambulatory Visit: Payer: Self-pay | Admitting: Family Medicine

## 2022-07-08 ENCOUNTER — Ambulatory Visit (INDEPENDENT_AMBULATORY_CARE_PROVIDER_SITE_OTHER): Payer: Medicare Other | Admitting: Family Medicine

## 2022-07-08 DIAGNOSIS — M816 Localized osteoporosis [Lequesne]: Secondary | ICD-10-CM

## 2022-07-08 DIAGNOSIS — E785 Hyperlipidemia, unspecified: Secondary | ICD-10-CM | POA: Diagnosis not present

## 2022-07-08 DIAGNOSIS — J4521 Mild intermittent asthma with (acute) exacerbation: Secondary | ICD-10-CM | POA: Diagnosis not present

## 2022-07-08 DIAGNOSIS — F324 Major depressive disorder, single episode, in partial remission: Secondary | ICD-10-CM

## 2022-07-08 MED ORDER — MONTELUKAST SODIUM 10 MG PO TABS: 10.0000 mg | ORAL_TABLET | Freq: Every day | ORAL | 1 refills | Status: DC

## 2022-07-08 MED ORDER — SERTRALINE HCL 100 MG PO TABS: 100.0000 mg | ORAL_TABLET | Freq: Every day | ORAL | 1 refills | Status: DC

## 2022-07-08 MED ORDER — ATORVASTATIN CALCIUM 10 MG PO TABS: 10.0000 mg | ORAL_TABLET | Freq: Every day | ORAL | 1 refills | Status: DC

## 2022-07-08 MED ORDER — ALENDRONATE SODIUM 70 MG PO TABS: 70.0000 mg | ORAL_TABLET | ORAL | 1 refills | Status: DC

## 2022-07-08 NOTE — Progress Notes (Signed)
Date:  07/08/2022   Name:  Marilyn Molina   DOB:  Jul 27, 1951   MRN:  [id number]   Chief Complaint: Depression, Allergic Rhinitis , Hyperlipidemia, and Osteoporosis  Depression        This is a chronic problem.  The current episode started more than 1 year ago.   The problem occurs constantly.  The problem has been gradually improving since onset.  Associated symptoms include fatigue, insomnia and decreased interest.  Associated symptoms include no helplessness, no hopelessness, not irritable, no restlessness, no appetite change, no body aches, no myalgias, no indigestion, not sad and no suicidal ideas.     The symptoms are aggravated by nothing.  Past treatments include SSRIs - Selective serotonin reuptake inhibitors.  Previous treatment provided moderate relief.   Pertinent negatives include no hypothyroidism. Hyperlipidemia This is a chronic problem. The current episode started more than 1 year ago. The problem is controlled. Recent lipid tests were reviewed and are normal. She has no history of chronic renal disease, diabetes, hypothyroidism, liver disease, obesity or nephrotic syndrome. Factors aggravating her hyperlipidemia include thiazides. Pertinent negatives include no myalgias or shortness of breath. The current treatment provides moderate improvement of lipids. There are no compliance problems.  Risk factors for coronary artery disease include dyslipidemia and hypertension.    Lab Results  Component Value Date   NA 143 01/07/2022   K 5.0 01/07/2022   CO2 22 01/07/2022   GLUCOSE 93 01/07/2022   BUN 13 01/07/2022   CREATININE 0.89 01/07/2022   CALCIUM 9.5 01/07/2022   EGFR 70 01/07/2022   GFRNONAA 84 01/10/2021   Lab Results  Component Value Date   CHOL 152 01/07/2022   HDL 69 01/07/2022   LDLCALC 67 01/07/2022   TRIG 83 01/07/2022   CHOLHDL 2.9 10/08/2018   Lab Results  Component Value Date   TSH 1.430 01/10/2021   No results found for: "HGBA1C" Lab Results   Component Value Date   WBC 10.9 (H) 01/10/2021   HGB 13.7 01/10/2021   HCT 40.5 01/10/2021   MCV 96 01/10/2021   PLT 389 01/10/2021   Lab Results  Component Value Date   ALT 11 01/07/2022   AST 15 01/07/2022   ALKPHOS 87 01/07/2022   BILITOT <0.2 01/07/2022   No results found for: "25OHVITD2", "25OHVITD3", "VD25OH"   Review of Systems  Constitutional:  Positive for fatigue. Negative for appetite change, chills, fever and unexpected weight change.  HENT:  Negative for congestion, ear discharge, ear pain, rhinorrhea, sinus pressure, sneezing and sore throat.   Respiratory:  Negative for cough, shortness of breath, wheezing and stridor.   Gastrointestinal:  Negative for abdominal distention, abdominal pain, diarrhea and nausea.  Genitourinary:  Negative for difficulty urinating, dysuria and menstrual problem.  Musculoskeletal:  Negative for myalgias.  Skin:  Negative for rash.  Neurological:  Negative for weakness.  Psychiatric/Behavioral:  Positive for depression. Negative for suicidal ideas. The patient has insomnia. The patient is not nervous/anxious.     Patient Active Problem List   Diagnosis Date Noted   Centrilobular emphysema ([place]) 04/11/2022   Aortic atherosclerosis ([place]) 04/11/2022   Anxiety 11/21/2015   Essential hypertension 11/21/2015   Anxiety and depression 11/03/2012   Cardiomyopathy ([place]) 10/29/2011   Cigarette nicotine dependence without complication 44/31/5400   Tobacco abuse 10/29/2011    No Known Allergies  Past Surgical History:  Procedure Laterality Date   OVARIAN CYST REMOVAL      Social History   Tobacco  Use   Smoking status: Every Day    Packs/day: 1.00    Years: 51.00    Total pack years: 51.00    Types: Cigarettes   Smokeless tobacco: Never   Tobacco comments:    1PPD 04/25/2022  Vaping Use   Vaping Use: Never used  Substance Use Topics   Alcohol use: No   Drug use: No     Medication list has been reviewed and  updated.  Current Meds  Medication Sig   alendronate (FOSAMAX) 70 MG tablet Take 1 tablet (70 mg total) by mouth every 7 (seven) days. Take with a full glass of water on an empty stomach.   atorvastatin (LIPITOR) 10 MG tablet Take 1 tablet (10 mg total) by mouth daily.   Calcium Carbonate-Vit D-Min (CALCIUM 1200 PO) Take 1 tablet by mouth daily.   carvedilol (COREG) 3.125 MG tablet Take 1 tablet (3.125 mg total) by mouth 2 (two) times daily with a meal. (Patient taking differently: Take 3.125 mg by mouth 2 (two) times daily with a meal. 2 pills BID/ cardio at [facility])   cholecalciferol (VITAMIN D3) 25 MCG (1000 UNIT) tablet Take 1,000 Units by mouth daily.   ENTRESTO 24-26 MG Take 1 tablet by mouth 2 (two) times daily. [facility]   montelukast (SINGULAIR) 10 MG tablet Take 1 tablet (10 mg total) by mouth daily.   Multiple Vitamin (MULTI-VITAMIN) tablet Take 1 tablet by mouth daily.   sertraline (ZOLOFT) 100 MG tablet Take 1 tablet (100 mg total) by mouth daily.   vitamin B-12 (CYANOCOBALAMIN) 1000 MCG tablet Take 1,000 mcg by mouth daily.       07/08/2022    8:59 AM 04/11/2022   11:31 AM 01/07/2022    9:04 AM 07/02/2021    8:30 AM  GAD 7 : Generalized Anxiety Score  Nervous, Anxious, on Edge 0 0 0 0  Control/stop worrying 0 0 0 0  Worry too much - different things 0 0 0 0  Trouble relaxing 0 0 0 0  Restless 0 0 0 0  Easily annoyed or irritable 0 0 0 0  Afraid - awful might happen 0 0 0 0  Total GAD 7 Score 0 0 0 0  Anxiety Difficulty Not difficult at all Not difficult at all Not difficult at all Not difficult at all       07/08/2022    8:58 AM 04/11/2022   11:31 AM 02/11/2022   11:28 AM  Depression screen PHQ 2/9  Decreased Interest 1 0 0  Down, Depressed, Hopeless 0 0 0  PHQ - 2 Score 1 0 0  Altered sleeping 1 0 0  Tired, decreased energy '1 2 1  ' Change in appetite 0 1 0  Feeling bad or failure about yourself  0 0 0  Trouble concentrating 0 0 0  Moving slowly or  fidgety/restless 0 0 0  Suicidal thoughts 0 0 0  PHQ-9 Score '3 3 1  ' Difficult doing work/chores Not difficult at all Not difficult at all Not difficult at all    BP Readings from Last 3 Encounters:  07/08/22 130/74  04/25/22 128/74  04/11/22 128/80    Physical Exam Vitals and nursing note reviewed. Exam conducted with a chaperone present.  Constitutional:      General: She is not irritable.She is not in acute distress.    Appearance: She is not diaphoretic.  HENT:     Head: Normocephalic and atraumatic.     Right Ear: Tympanic membrane and  external ear normal.     Left Ear: Tympanic membrane and external ear normal.     Nose: Nose normal.  Eyes:     General:        Right eye: No discharge.        Left eye: No discharge.     Conjunctiva/sclera: Conjunctivae normal.     Pupils: Pupils are equal, round, and reactive to light.  Neck:     Thyroid: No thyromegaly.     Vascular: No JVD.  Cardiovascular:     Rate and Rhythm: Normal rate and regular rhythm.     Heart sounds: Normal heart sounds. No murmur heard.    No gallop.  Pulmonary:     Breath sounds: Normal breath sounds.  Abdominal:     Palpations: Abdomen is soft. There is no mass.     Tenderness: There is no abdominal tenderness.  Musculoskeletal:        General: Normal range of motion.     Cervical back: Normal range of motion and neck supple.  Lymphadenopathy:     Cervical: No cervical adenopathy.  Skin:    General: Skin is warm and dry.  Neurological:     Mental Status: She is alert.     Wt Readings from Last 3 Encounters:  07/08/22 124 lb (56.2 kg)  04/25/22 128 lb 6.4 oz (58.2 kg)  04/11/22 127 lb (57.6 kg)    BP 130/74   Pulse 72   Ht '5\' 6"'  (1.676 m)   Wt 124 lb (56.2 kg)   BMI 20.01 kg/m   Assessment and Plan:  1. Localized osteoporosis without current pathological fracture Chronic.  Controlled.  Stable.  Tolerating Fosamax well.  Continue Fosamax 70 mg once a week for recheck in 6 months -  alendronate (FOSAMAX) 70 MG tablet; Take 1 tablet (70 mg total) by mouth every 7 (seven) days. Take with a full glass of water on an empty stomach.  Dispense: 12 tablet; Refill: 1  2. Hyperlipidemia, unspecified hyperlipidemia type Chronic.  Controlled.  Stable.  Continue to hold statin continue review of lipid panel with 3 months ago. - atorvastatin (LIPITOR) 10 MG tablet; Take 1 tablet (10 mg total) by mouth daily.  Dispense: 90 tablet; Refill: 1  3. Mild intermittent asthma with acute exacerbation Controlled.  Stable.  Continue same - montelukast (SINGULAIR) 10 MG tablet; Take 1 tablet (10 mg total) by mouth daily.  Dispense: 90 tablet; Refill: 1  4. Major depressive disorder in partial remission, unspecified whether recurrent (HCC) Chronic.  Controlled.  Stable HQ is 3.  Scored 0.  Continue sertraline 1 we will recheck patient in 15-month- sertraline (ZOLOFT) 100 MG tablet; Take 1 tablet (100 mg total) by mouth daily.  Dispense: 90 tablet; Refill: 1

## 2022-07-10 ENCOUNTER — Ambulatory Visit: Payer: Medicare Other | Admitting: Pulmonary Disease

## 2022-07-15 ENCOUNTER — Ambulatory Visit: Payer: Medicare Other | Attending: Pulmonary Disease

## 2022-07-15 DIAGNOSIS — J84115 Respiratory bronchiolitis interstitial lung disease: Secondary | ICD-10-CM | POA: Diagnosis not present

## 2022-07-15 DIAGNOSIS — J449 Chronic obstructive pulmonary disease, unspecified: Secondary | ICD-10-CM | POA: Diagnosis not present

## 2022-07-15 DIAGNOSIS — F17200 Nicotine dependence, unspecified, uncomplicated: Secondary | ICD-10-CM | POA: Insufficient documentation

## 2022-07-15 LAB — PULMONARY FUNCTION TEST ARMC ONLY
DL/VA % pred: 55 %
DL/VA: 2.25 ml/min/mmHg/L
DLCO unc % pred: 51 %
DLCO unc: 10.84 ml/min/mmHg
FEF 25-75 Post: 1.08 L/sec
FEF 25-75 Pre: 1.17 L/sec
FEF2575-%Change-Post: -8 %
FEF2575-%Pred-Post: 52 %
FEF2575-%Pred-Pre: 56 %
FEV1-%Change-Post: -1 %
FEV1-%Pred-Post: 67 %
FEV1-%Pred-Pre: 68 %
FEV1-Post: 1.68 L
FEV1-Pre: 1.7 L
FEV1FVC-%Change-Post: 0 %
FEV1FVC-%Pred-Pre: 95 %
FEV6-%Change-Post: -1 %
FEV6-%Pred-Post: 73 %
FEV6-%Pred-Pre: 74 %
FEV6-Post: 2.3 L
FEV6-Pre: 2.33 L
FEV6FVC-%Change-Post: 0 %
FEV6FVC-%Pred-Post: 104 %
FEV6FVC-%Pred-Pre: 104 %
FVC-%Change-Post: -1 %
FVC-%Pred-Post: 70 %
FVC-%Pred-Pre: 71 %
FVC-Post: 2.3 L
FVC-Pre: 2.34 L
Post FEV1/FVC ratio: 73 %
Post FEV6/FVC ratio: 100 %
Pre FEV1/FVC ratio: 73 %
Pre FEV6/FVC Ratio: 100 %
RV % pred: 100 %
RV: 2.28 L
TLC % pred: 93 %
TLC: 5.02 L

## 2022-07-15 MED ORDER — ALBUTEROL SULFATE (2.5 MG/3ML) 0.083% IN NEBU
2.5000 mg | INHALATION_SOLUTION | Freq: Once | RESPIRATORY_TRACT | Status: AC
  Administered 2022-07-15: 2.5 mg via RESPIRATORY_TRACT
  Filled 2022-07-15: qty 3

## 2022-07-18 ENCOUNTER — Encounter: Payer: Self-pay | Admitting: Family Medicine

## 2022-07-18 ENCOUNTER — Ambulatory Visit: Payer: Self-pay | Admitting: *Deleted

## 2022-07-18 ENCOUNTER — Telehealth: Payer: Self-pay | Admitting: Family Medicine

## 2022-07-18 ENCOUNTER — Ambulatory Visit (INDEPENDENT_AMBULATORY_CARE_PROVIDER_SITE_OTHER): Payer: Medicare Other | Admitting: Family Medicine

## 2022-07-18 VITALS — BP 120/60 | HR 80 | Ht 66.0 in | Wt 126.0 lb

## 2022-07-18 DIAGNOSIS — L509 Urticaria, unspecified: Secondary | ICD-10-CM | POA: Diagnosis not present

## 2022-07-18 MED ORDER — PREDNISONE 10 MG PO TABS: ORAL_TABLET | ORAL | 1 refills | Status: DC

## 2022-07-18 MED ORDER — EPINEPHRINE 0.3 MG/0.3ML IJ SOAJ: 0.3000 mg | INTRAMUSCULAR | 1 refills | Status: DC | PRN

## 2022-07-18 NOTE — Telephone Encounter (Signed)
Called pt left VM to call back.  KP 

## 2022-07-18 NOTE — Telephone Encounter (Signed)
Summary: itching- possible allergic to medication   Pt called in for assistance. Pt says that she has been itching for about 3 weeks. Pt says that she think that it is due to a change with her medication because she didn't start experiencing the itching until then. Pt says that she started a new med in June. Pt says itching is getting worse. PCP next opening on 8/31, please assist pt further        Chief Complaint: itching  Symptoms: started itching on chest and now spread to legs and head . Scratching and now bleeding at times. Frequency: 3 weeks  Pertinent Negatives: Patient denies rash, no drainage Disposition: [] ED /[] Urgent Care (no appt availability in office) / [] Appointment(In office/virtual)/ [x]  [facility]/ [] Home Care/ [] Refused Recommended Disposition /[] [facility]/ []  Follow-up with PCP Additional Notes:   Possible reaction to medication fosamax per patient . Started itching 3 weeks ago after starting medication and now getting worse. Will try recommended hydrocortisone. Recommended UC VV. No available OV until 08/01/22. Please advise if appt can be scheduled this week.      Reason for Disposition  [1] Cause unknown AND [2] present > 7 days  Answer Assessment - Initial Assessment Questions 1. DESCRIPTION: "Describe the itching you are having." "Where is it located?"     Started in chest and now located between legs and out part of legs  2. SEVERITY: "How bad is it?"    - MILD: Doesn't interfere with normal activities.   - MODERATE-SEVERE: Interferes with work, school, sleep, or other activities.      Worsening  3. SCRATCHING: "Are there any scratch marks? Bleeding?"     Yes on legs  4. ONSET: "When did the itching begin?"      3 week ago  5. CAUSE: "What do you think is causing the itching?"      Not sure possible medication reaction fosamax 6. OTHER SYMPTOMS: "Do you have any other symptoms?"      Feels like little bumps ,  but can not see any  bumps 7. PREGNANCY: "Is there any chance you are pregnant?" "When was your last menstrual period?"     na  Protocols used: Itching - Localized-A-AH

## 2022-07-18 NOTE — Progress Notes (Signed)
Date:  07/18/2022   Name:  Marilyn Molina   DOB:  10-06-1951   MRN:  [id number]   Chief Complaint: skin itching (Started itching on chest and has moved to legs- has bruised skin from scratching. Tried otc cream and benadryl- not helping)  Rash This is a new problem. The current episode started in the past 7 days (monday). The problem is unchanged. The affected locations include the scalp, chest, torso, left arm, left upper leg, left lower leg, right arm, right upper leg and right lower leg. The rash is characterized by redness. Pertinent negatives include no congestion, cough, fever or shortness of breath.    Lab Results  Component Value Date   NA 143 01/07/2022   K 5.0 01/07/2022   CO2 22 01/07/2022   GLUCOSE 93 01/07/2022   BUN 13 01/07/2022   CREATININE 0.89 01/07/2022   CALCIUM 9.5 01/07/2022   EGFR 70 01/07/2022   GFRNONAA 84 01/10/2021   Lab Results  Component Value Date   CHOL 152 01/07/2022   HDL 69 01/07/2022   LDLCALC 67 01/07/2022   TRIG 83 01/07/2022   CHOLHDL 2.9 10/08/2018   Lab Results  Component Value Date   TSH 1.430 01/10/2021   No results found for: "HGBA1C" Lab Results  Component Value Date   WBC 10.9 (H) 01/10/2021   HGB 13.7 01/10/2021   HCT 40.5 01/10/2021   MCV 96 01/10/2021   PLT 389 01/10/2021   Lab Results  Component Value Date   ALT 11 01/07/2022   AST 15 01/07/2022   ALKPHOS 87 01/07/2022   BILITOT <0.2 01/07/2022   No results found for: "25OHVITD2", "25OHVITD3", "VD25OH"   Review of Systems  Constitutional:  Negative for fever.  HENT:  Negative for congestion.   Respiratory:  Negative for cough, chest tightness, shortness of breath and wheezing.   Cardiovascular:  Negative for chest pain, palpitations and leg swelling.  Gastrointestinal:  Negative for abdominal distention.  Skin:  Positive for rash.    Patient Active Problem List   Diagnosis Date Noted   Centrilobular emphysema ([place]) 04/11/2022   Aortic  atherosclerosis ([place]) 04/11/2022   Anxiety 11/21/2015   Essential hypertension 11/21/2015   Anxiety and depression 11/03/2012   Cardiomyopathy ([place]) 10/29/2011   Cigarette nicotine dependence without complication 38/87/1959   Tobacco abuse 10/29/2011    No Known Allergies  Past Surgical History:  Procedure Laterality Date   OVARIAN CYST REMOVAL      Social History   Tobacco Use   Smoking status: Every Day    Packs/day: 1.00    Years: 51.00    Total pack years: 51.00    Types: Cigarettes   Smokeless tobacco: Never   Tobacco comments:    1PPD 04/25/2022  Vaping Use   Vaping Use: Never used  Substance Use Topics   Alcohol use: No   Drug use: No     Medication list has been reviewed and updated.  Current Meds  Medication Sig   alendronate (FOSAMAX) 70 MG tablet Take 1 tablet (70 mg total) by mouth every 7 (seven) days. Take with a full glass of water on an empty stomach.   atorvastatin (LIPITOR) 10 MG tablet Take 1 tablet (10 mg total) by mouth daily.   Calcium Carbonate-Vit D-Min (CALCIUM 1200 PO) Take 1 tablet by mouth daily.   carvedilol (COREG) 3.125 MG tablet Take 1 tablet (3.125 mg total) by mouth 2 (two) times daily with a meal. (Patient taking differently: Take  3.125 mg by mouth 2 (two) times daily with a meal. 2 pills BID/ cardio at [facility])   cholecalciferol (VITAMIN D3) 25 MCG (1000 UNIT) tablet Take 1,000 Units by mouth daily.   ENTRESTO 24-26 MG Take 1 tablet by mouth 2 (two) times daily. [facility]   montelukast (SINGULAIR) 10 MG tablet Take 1 tablet (10 mg total) by mouth daily.   Multiple Vitamin (MULTI-VITAMIN) tablet Take 1 tablet by mouth daily.   sertraline (ZOLOFT) 100 MG tablet Take 1 tablet (100 mg total) by mouth daily.   vitamin B-12 (CYANOCOBALAMIN) 1000 MCG tablet Take 1,000 mcg by mouth daily.       07/08/2022    8:59 AM 04/11/2022   11:31 AM 01/07/2022    9:04 AM 07/02/2021    8:30 AM  GAD 7 : Generalized Anxiety Score  Nervous, Anxious, on  Edge 0 0 0 0  Control/stop worrying 0 0 0 0  Worry too much - different things 0 0 0 0  Trouble relaxing 0 0 0 0  Restless 0 0 0 0  Easily annoyed or irritable 0 0 0 0  Afraid - awful might happen 0 0 0 0  Total GAD 7 Score 0 0 0 0  Anxiety Difficulty Not difficult at all Not difficult at all Not difficult at all Not difficult at all       07/08/2022    8:58 AM 04/11/2022   11:31 AM 02/11/2022   11:28 AM  Depression screen PHQ 2/9  Decreased Interest 1 0 0  Down, Depressed, Hopeless 0 0 0  PHQ - 2 Score 1 0 0  Altered sleeping 1 0 0  Tired, decreased energy '1 2 1  ' Change in appetite 0 1 0  Feeling bad or failure about yourself  0 0 0  Trouble concentrating 0 0 0  Moving slowly or fidgety/restless 0 0 0  Suicidal thoughts 0 0 0  PHQ-9 Score '3 3 1  ' Difficult doing work/chores Not difficult at all Not difficult at all Not difficult at all    BP Readings from Last 3 Encounters:  07/18/22 120/60  07/08/22 130/74  04/25/22 128/74    Physical Exam HENT:     Right Ear: Tympanic membrane normal.     Left Ear: Tympanic membrane normal.     Nose: Nose normal.     Mouth/Throat:     Mouth: Mucous membranes are moist.  Eyes:     Pupils: Pupils are equal, round, and reactive to light.  Cardiovascular:     Rate and Rhythm: Normal rate and regular rhythm.     Heart sounds: No murmur heard.    No friction rub. No gallop.  Pulmonary:     Effort: No respiratory distress.     Breath sounds: No wheezing, rhonchi or rales.  Abdominal:     Tenderness: There is no abdominal tenderness.  Musculoskeletal:     Cervical back: Normal range of motion.  Skin:    General: Skin is warm.     Findings: Bruising present. No erythema or rash.  Neurological:     Mental Status: She is alert.     Wt Readings from Last 3 Encounters:  07/18/22 126 lb (57.2 kg)  07/08/22 124 lb (56.2 kg)  04/25/22 128 lb 6.4 oz (58.2 kg)    BP 120/60   Pulse 80   Ht '5\' 6"'  (1.676 m)   Wt 126 lb (57.2 kg)    BMI 20.34 kg/m   Assessment and  Plan:  1. Urticaria New onset.  Persistent.  Stable.  Patient has an urticarial type rash over her anterior torso scalp arms and lower legs.  This is consistent with urticaria and it is unknown decided as to the etiology whether it is of food or medication or contact.  I have discontinued the Fosamax and that this was last taken on Monday and we will continue to monitor her osteoporosis with likely calcium and vitamin D.  Patient will be contacting her cardiologist before next dosing of Entresto to see if they want to adjust dosing or temporarily discontinue.  In the meantime we will treat with daytime Zyrtec times Benadryl.  We will also initiate a prednisone taper beginning at 60 mg over 2 weeks..  Patient is to continue her Singulair for suppression of leukotrienes as well.  Patient is to call if there is any shortness of breath or increased pruritus or area of concern.  We will also dispense an EpiPen to have in case this may be food related such as shellfish or peanuts. - predniSONE (DELTASONE) 10 MG tablet; Taper 6,6,6,5,5,5,4,4,3,3,2,2,1,1  Dispense: 45 tablet; Refill: 1 - EPINEPHrine 0.3 mg/0.3 mL IJ SOAJ injection; Inject 0.3 mg into the muscle as needed for anaphylaxis.  Dispense: 1 each; Refill: 1    Otilio Miu, MD

## 2022-07-18 NOTE — Telephone Encounter (Signed)
Patient seen in office today. 

## 2022-07-18 NOTE — Telephone Encounter (Signed)
Copied from CRM [id number]. Topic: General - Other >> Jul 18, 2022 11:08 AM Esperanza Sheets wrote: Pt returning call  Please reference 8-17 TE and fu w/ pt

## 2022-07-18 NOTE — Telephone Encounter (Signed)
Called pt scheduled appt for today 8/17.  KP

## 2022-07-24 ENCOUNTER — Other Ambulatory Visit: Payer: Self-pay

## 2022-07-24 MED ORDER — ANORO ELLIPTA 62.5-25 MCG/ACT IN AEPB: 1.0000 | INHALATION_SPRAY | Freq: Every day | RESPIRATORY_TRACT | 5 refills | Status: DC

## 2022-07-25 ENCOUNTER — Ambulatory Visit
Admission: RE | Admit: 2022-07-25 | Discharge: 2022-07-25 | Disposition: A | Discharge status: Home / Self Care | Payer: Medicare Other | Source: Ambulatory Visit | Attending: Acute Care | Admitting: Acute Care

## 2022-07-25 DIAGNOSIS — J439 Emphysema, unspecified: Secondary | ICD-10-CM | POA: Diagnosis not present

## 2022-07-25 DIAGNOSIS — Z87891 Personal history of nicotine dependence: Secondary | ICD-10-CM | POA: Insufficient documentation

## 2022-07-25 DIAGNOSIS — R911 Solitary pulmonary nodule: Secondary | ICD-10-CM | POA: Diagnosis not present

## 2022-07-25 DIAGNOSIS — I700 Atherosclerosis of aorta: Secondary | ICD-10-CM | POA: Diagnosis not present

## 2022-07-25 DIAGNOSIS — F17210 Nicotine dependence, cigarettes, uncomplicated: Secondary | ICD-10-CM | POA: Insufficient documentation

## 2022-07-30 ENCOUNTER — Other Ambulatory Visit: Payer: Self-pay

## 2022-07-30 DIAGNOSIS — F17210 Nicotine dependence, cigarettes, uncomplicated: Secondary | ICD-10-CM

## 2022-07-30 DIAGNOSIS — Z122 Encounter for screening for malignant neoplasm of respiratory organs: Secondary | ICD-10-CM

## 2022-07-30 DIAGNOSIS — Z87891 Personal history of nicotine dependence: Secondary | ICD-10-CM

## 2022-07-31 DIAGNOSIS — I2510 Atherosclerotic heart disease of native coronary artery without angina pectoris: Secondary | ICD-10-CM | POA: Diagnosis not present

## 2022-07-31 DIAGNOSIS — I428 Other cardiomyopathies: Secondary | ICD-10-CM | POA: Diagnosis not present

## 2022-07-31 DIAGNOSIS — Z720 Tobacco use: Secondary | ICD-10-CM | POA: Diagnosis not present

## 2022-07-31 DIAGNOSIS — E782 Mixed hyperlipidemia: Secondary | ICD-10-CM | POA: Diagnosis not present

## 2022-07-31 DIAGNOSIS — I429 Cardiomyopathy, unspecified: Secondary | ICD-10-CM | POA: Diagnosis not present

## 2022-07-31 DIAGNOSIS — H2513 Age-related nuclear cataract, bilateral: Secondary | ICD-10-CM | POA: Diagnosis not present

## 2022-07-31 DIAGNOSIS — H5213 Myopia, bilateral: Secondary | ICD-10-CM | POA: Diagnosis not present

## 2022-08-26 DIAGNOSIS — H2513 Age-related nuclear cataract, bilateral: Secondary | ICD-10-CM | POA: Diagnosis not present

## 2022-08-26 DIAGNOSIS — H2511 Age-related nuclear cataract, right eye: Secondary | ICD-10-CM | POA: Diagnosis not present

## 2022-08-27 ENCOUNTER — Ambulatory Visit (INDEPENDENT_AMBULATORY_CARE_PROVIDER_SITE_OTHER): Payer: Medicare Other | Admitting: Family Medicine

## 2022-08-27 ENCOUNTER — Encounter: Payer: Self-pay | Admitting: Family Medicine

## 2022-08-27 VITALS — BP 114/70 | HR 72 | Ht 66.0 in | Wt 131.0 lb

## 2022-08-27 DIAGNOSIS — J0100 Acute maxillary sinusitis, unspecified: Secondary | ICD-10-CM

## 2022-08-27 DIAGNOSIS — Z23 Encounter for immunization: Secondary | ICD-10-CM | POA: Diagnosis not present

## 2022-08-27 MED ORDER — AMOXICILLIN-POT CLAVULANATE 875-125 MG PO TABS: 1.0000 | ORAL_TABLET | Freq: Two times a day (BID) | ORAL | 0 refills | Status: DC

## 2022-08-27 NOTE — Progress Notes (Signed)
Date:  08/27/2022   Name:  Marilyn Molina   DOB:  Jun 27, 1951   MRN:  [id number]   Chief Complaint: Flu Vaccine and Sinusitis (Started Sunday with cong, cheeks hurting and ears hurting- yellow production. Taking mucinex- helped some)  Sinusitis This is a new problem. The current episode started more than 1 year ago. The problem has been gradually improving since onset. There has been no fever. The pain is moderate. Associated symptoms include congestion, ear pain, sinus pressure, sneezing and swollen glands. Pertinent negatives include no chills, coughing, diaphoresis, headaches, neck pain, shortness of breath or sore throat. The treatment provided moderate relief.    Lab Results  Component Value Date   NA 143 01/07/2022   K 5.0 01/07/2022   CO2 22 01/07/2022   GLUCOSE 93 01/07/2022   BUN 13 01/07/2022   CREATININE 0.89 01/07/2022   CALCIUM 9.5 01/07/2022   EGFR 70 01/07/2022   GFRNONAA 84 01/10/2021   Lab Results  Component Value Date   CHOL 152 01/07/2022   HDL 69 01/07/2022   LDLCALC 67 01/07/2022   TRIG 83 01/07/2022   CHOLHDL 2.9 10/08/2018   Lab Results  Component Value Date   TSH 1.430 01/10/2021   No results found for: "HGBA1C" Lab Results  Component Value Date   WBC 10.9 (H) 01/10/2021   HGB 13.7 01/10/2021   HCT 40.5 01/10/2021   MCV 96 01/10/2021   PLT 389 01/10/2021   Lab Results  Component Value Date   ALT 11 01/07/2022   AST 15 01/07/2022   ALKPHOS 87 01/07/2022   BILITOT <0.2 01/07/2022   No results found for: "25OHVITD2", "25OHVITD3", "VD25OH"   Review of Systems  Constitutional:  Negative for chills, diaphoresis and fever.  HENT:  Positive for congestion, ear pain, sinus pressure and sneezing. Negative for drooling, ear discharge and sore throat.   Respiratory:  Negative for cough, shortness of breath and wheezing.   Cardiovascular:  Negative for chest pain, palpitations and leg swelling.  Gastrointestinal:  Negative for abdominal  pain, blood in stool, constipation, diarrhea and nausea.  Endocrine: Negative for polydipsia.  Genitourinary:  Negative for dysuria, frequency, hematuria and urgency.  Musculoskeletal:  Negative for back pain, myalgias and neck pain.  Skin:  Negative for rash.  Allergic/Immunologic: Negative for environmental allergies.  Neurological:  Negative for dizziness and headaches.  Hematological:  Does not bruise/bleed easily.  Psychiatric/Behavioral:  Negative for suicidal ideas. The patient is not nervous/anxious.     Patient Active Problem List   Diagnosis Date Noted   Centrilobular emphysema ([place]) 04/11/2022   Aortic atherosclerosis ([place]) 04/11/2022   Anxiety 11/21/2015   Essential hypertension 11/21/2015   Anxiety and depression 11/03/2012   Cardiomyopathy ([place]) 10/29/2011   Cigarette nicotine dependence without complication 03/83/3383   Tobacco abuse 10/29/2011    No Known Allergies  Past Surgical History:  Procedure Laterality Date   OVARIAN CYST REMOVAL      Social History   Tobacco Use   Smoking status: Every Day    Packs/day: 1.00    Years: 51.00    Total pack years: 51.00    Types: Cigarettes   Smokeless tobacco: Never   Tobacco comments:    1PPD 04/25/2022  Vaping Use   Vaping Use: Never used  Substance Use Topics   Alcohol use: No   Drug use: No     Medication list has been reviewed and updated.  Current Meds  Medication Sig   atorvastatin (LIPITOR) 10  MG tablet Take 1 tablet (10 mg total) by mouth daily.   Calcium Carbonate-Vit D-Min (CALCIUM 1200 PO) Take 1 tablet by mouth daily.   carvedilol (COREG) 3.125 MG tablet Take 1 tablet (3.125 mg total) by mouth 2 (two) times daily with a meal. (Patient taking differently: Take 3.125 mg by mouth 2 (two) times daily with a meal. 2 pills BID/ cardio at [facility])   cholecalciferol (VITAMIN D3) 25 MCG (1000 UNIT) tablet Take 1,000 Units by mouth daily.   ENTRESTO 24-26 MG Take 1 tablet by mouth 2 (two) times daily.  [facility]   EPINEPHrine 0.3 mg/0.3 mL IJ SOAJ injection Inject 0.3 mg into the muscle as needed for anaphylaxis.   montelukast (SINGULAIR) 10 MG tablet Take 1 tablet (10 mg total) by mouth daily.   Multiple Vitamin (MULTI-VITAMIN) tablet Take 1 tablet by mouth daily.   sertraline (ZOLOFT) 100 MG tablet Take 1 tablet (100 mg total) by mouth daily.   umeclidinium-vilanterol (ANORO ELLIPTA) 62.5-25 MCG/ACT AEPB Inhale 1 puff into the lungs daily.   vitamin B-12 (CYANOCOBALAMIN) 1000 MCG tablet Take 1,000 mcg by mouth daily.       08/27/2022   11:19 AM 07/08/2022    8:59 AM 04/11/2022   11:31 AM 01/07/2022    9:04 AM  GAD 7 : Generalized Anxiety Score  Nervous, Anxious, on Edge 0 0 0 0  Control/stop worrying 0 0 0 0  Worry too much - different things 0 0 0 0  Trouble relaxing 0 0 0 0  Restless 0 0 0 0  Easily annoyed or irritable 0 0 0 0  Afraid - awful might happen 0 0 0 0  Total GAD 7 Score 0 0 0 0  Anxiety Difficulty Not difficult at all Not difficult at all Not difficult at all Not difficult at all       08/27/2022   11:19 AM 07/08/2022    8:58 AM 04/11/2022   11:31 AM  Depression screen PHQ 2/9  Decreased Interest 0 1 0  Down, Depressed, Hopeless 0 0 0  PHQ - 2 Score 0 1 0  Altered sleeping 0 1 0  Tired, decreased energy 0 1 2  Change in appetite 0 0 1  Feeling bad or failure about yourself  0 0 0  Trouble concentrating 0 0 0  Moving slowly or fidgety/restless 0 0 0  Suicidal thoughts 0 0 0  PHQ-9 Score 0 3 3  Difficult doing work/chores Not difficult at all Not difficult at all Not difficult at all    BP Readings from Last 3 Encounters:  08/27/22 114/70  07/18/22 120/60  07/08/22 130/74    Physical Exam Vitals and nursing note reviewed.  Constitutional:      Appearance: She is well-developed.  HENT:     Head: Normocephalic.     Right Ear: Tympanic membrane and external ear normal.     Left Ear: Tympanic membrane and external ear normal.     Mouth/Throat:      Mouth: Mucous membranes are moist.  Eyes:     General: Lids are everted, no foreign bodies appreciated. No scleral icterus.       Left eye: No foreign body or hordeolum.     Conjunctiva/sclera: Conjunctivae normal.     Right eye: Right conjunctiva is not injected.     Left eye: Left conjunctiva is not injected.     Pupils: Pupils are equal, round, and reactive to light.  Neck:  Thyroid: No thyromegaly.     Vascular: No JVD.     Trachea: No tracheal deviation.  Cardiovascular:     Rate and Rhythm: Normal rate and regular rhythm.     Heart sounds: Normal heart sounds. No murmur heard.    No friction rub. No gallop.  Pulmonary:     Effort: Pulmonary effort is normal. No respiratory distress.     Breath sounds: Normal breath sounds. No wheezing, rhonchi or rales.  Abdominal:     General: Bowel sounds are normal.     Palpations: Abdomen is soft. There is no mass.     Tenderness: There is no abdominal tenderness. There is no guarding or rebound.  Musculoskeletal:        General: No tenderness. Normal range of motion.     Cervical back: Normal range of motion and neck supple.  Lymphadenopathy:     Cervical: No cervical adenopathy.  Skin:    General: Skin is warm.     Findings: No rash.  Neurological:     Mental Status: She is alert and oriented to person, place, and time.     Cranial Nerves: No cranial nerve deficit.     Deep Tendon Reflexes: Reflexes normal.  Psychiatric:        Mood and Affect: Mood is not anxious or depressed.     Wt Readings from Last 3 Encounters:  08/27/22 131 lb (59.4 kg)  07/18/22 126 lb (57.2 kg)  07/08/22 124 lb (56.2 kg)    BP 114/70   Pulse 72   Ht '5\' 6"'  (1.676 m)   Wt 131 lb (59.4 kg)   BMI 21.14 kg/m   Assessment and Plan:  1. Acute non-recurrent maxillary sinusitis New onset.  Persistent.  Relatively stable but symptomatic.  Patient has tenderness over both maxillary sinuses.  We will treat with Augmentin 875 mg twice a day for 10  days. - amoxicillin-clavulanate (AUGMENTIN) 875-125 MG tablet; Take 1 tablet by mouth 2 (two) times daily.  Dispense: 20 tablet; Refill: 0  2. Need for immunization against influenza Discussed and administered. - Flu Vaccine QUAD 34moIM (Fluarix, Fluzone & Alfiuria Quad PF)    DOtilio Miu MD

## 2022-09-04 ENCOUNTER — Ambulatory Visit: Payer: Medicare Other | Admitting: Pulmonary Disease

## 2022-09-04 ENCOUNTER — Encounter: Payer: Self-pay | Admitting: Pulmonary Disease

## 2022-09-04 VITALS — BP 110/60 | HR 72 | Temp 97.7°F | Ht 66.0 in | Wt 129.4 lb

## 2022-09-04 DIAGNOSIS — F17210 Nicotine dependence, cigarettes, uncomplicated: Secondary | ICD-10-CM

## 2022-09-04 DIAGNOSIS — J449 Chronic obstructive pulmonary disease, unspecified: Secondary | ICD-10-CM

## 2022-09-04 NOTE — Patient Instructions (Signed)
Continue using Anoro.  Continue to stay active as you are doing.  You are interested in smoking cessation strategies you may try the following: On the [place] website there is a subsection for Tobacco Cessation.  If you are interested in one-to-one telephonic tobacco cessation coaching with [facility] please call [phone] to enroll.  Nicotine replacement therapy patches and/or gum are available to [place] to participate. Another website that is good is this one: FailLinks.tn.  It is designed by the [facility]  We will see you in follow-up in September 2024.  However, please contact us prior to that time should any new difficulties arise.

## 2022-09-04 NOTE — Progress Notes (Signed)
Subjective:    Patient ID: Marilyn Molina, female    DOB: 1951-08-26, 71 y.o.   MRN: [id number] Patient Care Team: Duanne Limerick, MD as PCP - General (Family Medicine) Salena Saner, MD as Consulting Physician (Pulmonary Disease)  Chief Complaint  Patient presents with   Follow-up    COPD.     HPI Patient is a 71 year old current smoker (half PPD, 17 PY) who presents for follow-up on the issue of abnormal low-dose CT chest and stage II COPD.  The patient was initially seen here on 25 Apr 2022.  At that time she had a lung nodule noted on lung cancer screening that was suspicious.  This was 7.2 mm on the LEFT upper lobe.  Patient had follow-up CT chest on 25 July 2021 this showed complete resolution of this finding, eating this was an inflammatory change.  She had PFTs performed on 15 July 2022 consistent with moderate COPD/emphysema.  She has been started on [employer].  She notes that this medication helps shortness of breath significantly.  She continues to stay active.  Has not had any fevers, chills or sweats.  No chest pain.  No cough or sputum production.  No lower extremity edema nor calf tenderness.  Overall she feels well and looks well.  We discussed all of the imaging studies and PFTs with the patient.   Review of Systems A 10 point review of systems was performed and it is as noted above otherwise negative.  Patient Active Problem List   Diagnosis Date Noted   Centrilobular emphysema ([facility]) 04/11/2022   Aortic atherosclerosis ([facility]) 04/11/2022   Anxiety 11/21/2015   Essential hypertension 11/21/2015   Anxiety and depression 11/03/2012   Cardiomyopathy ([facility]) 10/29/2011   Cigarette nicotine dependence without complication 10/29/2011   Tobacco abuse 10/29/2011   Social History   Tobacco Use   Smoking status: Every Day    Packs/day: 1.00    Years: 51.00    Total pack years: 51.00    Types: Cigarettes   Smokeless tobacco: Never   Tobacco comments:     0.5 PPD 09/04/2022  Substance Use Topics   Alcohol use: No   No Known Allergies  Current Meds  Medication Sig   amoxicillin-clavulanate (AUGMENTIN) 875-125 MG tablet Take 1 tablet by mouth 2 (two) times daily.   atorvastatin (LIPITOR) 10 MG tablet Take 1 tablet (10 mg total) by mouth daily.   Calcium Carbonate-Vit D-Min (CALCIUM 1200 PO) Take 1 tablet by mouth daily.   carvedilol (COREG) 3.125 MG tablet Take 1 tablet (3.125 mg total) by mouth 2 (two) times daily with a meal. (Patient taking differently: Take 3.125 mg by mouth 2 (two) times daily with a meal. 2 pills BID/ cardio at [facility])   cholecalciferol (VITAMIN D3) 25 MCG (1000 UNIT) tablet Take 1,000 Units by mouth daily.   ENTRESTO 24-26 MG Take 1 tablet by mouth 2 (two) times daily. [facility]   EPINEPHrine 0.3 mg/0.3 mL IJ SOAJ injection Inject 0.3 mg into the muscle as needed for anaphylaxis.   montelukast (SINGULAIR) 10 MG tablet Take 1 tablet (10 mg total) by mouth daily.   Multiple Vitamin (MULTI-VITAMIN) tablet Take 1 tablet by mouth daily.   sertraline (ZOLOFT) 100 MG tablet Take 1 tablet (100 mg total) by mouth daily.   umeclidinium-vilanterol (ANORO ELLIPTA) 62.5-25 MCG/ACT AEPB Inhale 1 puff into the lungs daily.   vitamin B-12 (CYANOCOBALAMIN) 1000 MCG tablet Take 1,000 mcg by mouth daily.   Immunization  History  Administered Date(s) Administered   Fluad Quad(high Dose 65+) 09/06/2020, 10/31/2021   Influenza, High Dose Seasonal PF 10/08/2018   Influenza, Quadrivalent, Recombinant, Inj, Pf 09/20/2019   Influenza, Seasonal, Injecte, Preservative Fre 11/22/2014   Influenza,inj,Quad PF,6+ Mos 11/21/2015, 08/20/2017, 08/27/2022   Influenza-Unspecified 09/02/2013, 08/20/2017, 08/03/2019   Moderna Covid-19 Vaccine Bivalent Booster 37yrs & up 10/31/2021   PFIZER(Purple Top)SARS-COV-2 Vaccination 12/30/2019, 01/25/2020, 10/03/2020   Pneumococcal Conjugate-13 01/01/2018   Pneumococcal Polysaccharide-23 04/08/2019        Objective:   Physical Exam BP 110/60 (BP Location: Left Arm, Cuff Size: Normal)   Pulse 72   Temp 97.7 F (36.5 C)   Ht 5\' 6"  (1.676 m)   Wt 129 lb 6.4 oz (58.7 kg)   SpO2 96%   BMI 20.89 kg/m  GENERAL: Well-developed, well-nourished woman, no acute distress.  Fully ambulatory.  No conversational dyspnea. HEAD: Normocephalic, atraumatic.  EYES: Pupils equal, round, reactive to light.  No scleral icterus.  MOUTH: Oral mucosa moist.  No thrush. NECK: Supple. No thyromegaly. Trachea midline. No JVD.  No adenopathy. PULMONARY: Good air entry bilaterally.  Coarse breath sounds otherwise, no adventitious sounds. CARDIOVASCULAR: S1 and S2. Regular rate and rhythm.  No rubs, murmurs or gallops heard. ABDOMEN: Benign. MUSCULOSKELETAL: No joint deformity, no clubbing, no edema.  NEUROLOGIC: No overt focal deficit, no gait disturbance, speech is fluent. SKIN: Intact,warm,dry. PSYCH: Mood and behavior normal.  Representative image of CT performed 10 Apr 2022 showing a 7.2 mm diameter nodule on the LEFT upper lobe, film independently reviewed:   Representative image of the CT performed 25 July 2022 showing resolution of this finding indicating that this was inflammatory:   Recent Results (from the past 2160 hour(s))  Pulmonary Function Test [facility] Only     Status: None   Collection Time: 07/15/22  1:56 PM  Result Value Ref Range   FVC-Pre 2.34 L   FVC-%Pred-Pre 71 %   FVC-Post 2.30 L   FVC-%Pred-Post 70 %   FVC-%Change-Post -1 %   FEV1-Pre 1.70 L   FEV1-%Pred-Pre 68 %   FEV1-Post 1.68 L   FEV1-%Pred-Post 67 %   FEV1-%Change-Post -1 %   FEV6-Pre 2.33 L   FEV6-%Pred-Pre 74 %   FEV6-Post 2.30 L   FEV6-%Pred-Post 73 %   FEV6-%Change-Post -1 %   Pre FEV1/FVC ratio 73 %   FEV1FVC-%Pred-Pre 95 %   Post FEV1/FVC ratio 73 %   FEV1FVC-%Change-Post 0 %   Pre FEV6/FVC Ratio 100 %   FEV6FVC-%Pred-Pre 104 %   Post FEV6/FVC ratio 100 %   FEV6FVC-%Pred-Post 104 %   FEV6FVC-%Change-Post  0 %   FEF 25-75 Pre 1.17 L/sec   FEF2575-%Pred-Pre 56 %   FEF 25-75 Post 1.08 L/sec   FEF2575-%Pred-Post 52 %   FEF2575-%Change-Post -8 %   RV 2.28 L   RV % pred 100 %   TLC 5.02 L   TLC % pred 93 %   DLCO unc 10.84 ml/min/mmHg   DLCO unc % pred 51 %   DL/VA 2.25 ml/min/mmHg/L   DL/VA % pred 55 %      Assessment & Plan:     ICD-10-CM   1. Stage 2 moderate COPD by GOLD classification (HCC)  J44.9    Continue Anoro Ellipta We will see patient in follow-up in September 2024    2. Tobacco dependence due to cigarettes  F17.210    Counseled regards discontinuation of smoking Continue yearly lung cancer screening     See the  patient in follow-up September 2024 for after her lung cancer screening CT.  She is to contact us prior to that time should any new difficulties arise.  Gailen Shelter, MD Advanced Bronchoscopy PCCM [facility]    *This note was dictated using voice recognition software/Dragon.  Despite best efforts to proofread, errors can occur which can change the meaning. Any transcriptional errors that result from this process are unintentional and may not be fully corrected at the time of dictation.

## 2022-09-10 ENCOUNTER — Other Ambulatory Visit: Payer: Self-pay | Admitting: Family Medicine

## 2022-09-10 ENCOUNTER — Ambulatory Visit
Admission: RE | Admit: 2022-09-10 | Discharge: 2022-09-10 | Disposition: A | Discharge status: Home / Self Care | Payer: Medicare Other | Source: Ambulatory Visit | Attending: Family Medicine | Admitting: Family Medicine

## 2022-09-10 ENCOUNTER — Ambulatory Visit: Payer: Medicare Other | Admitting: Family Medicine

## 2022-09-10 ENCOUNTER — Encounter: Payer: Self-pay | Admitting: Family Medicine

## 2022-09-10 ENCOUNTER — Encounter: Payer: Medicare Other | Admitting: Family Medicine

## 2022-09-10 ENCOUNTER — Ambulatory Visit
Admission: RE | Admit: 2022-09-10 | Discharge: 2022-09-10 | Disposition: A | Discharge status: Home / Self Care | Payer: Medicare Other | Attending: Family Medicine | Admitting: Family Medicine

## 2022-09-10 VITALS — BP 108/60 | HR 70 | Ht 66.0 in | Wt 129.0 lb

## 2022-09-10 DIAGNOSIS — M25561 Pain in right knee: Secondary | ICD-10-CM | POA: Insufficient documentation

## 2022-09-10 DIAGNOSIS — M1711 Unilateral primary osteoarthritis, right knee: Secondary | ICD-10-CM | POA: Diagnosis not present

## 2022-09-10 DIAGNOSIS — G8929 Other chronic pain: Secondary | ICD-10-CM | POA: Diagnosis not present

## 2022-09-10 DIAGNOSIS — M25461 Effusion, right knee: Secondary | ICD-10-CM | POA: Diagnosis not present

## 2022-09-10 MED ORDER — DICLOFENAC SODIUM 50 MG PO TBEC: 50.0000 mg | DELAYED_RELEASE_TABLET | Freq: Two times a day (BID) | ORAL | 0 refills | Status: DC

## 2022-09-10 NOTE — Progress Notes (Signed)
     Primary Care / Sports Medicine Office Visit  Patient Information:  Patient ID: Marilyn Molina, female DOB: [birth year] Age: 71 y.o. MRN: [id number]   [name] is a pleasant 71 y.o. female presenting with the following:  Chief Complaint  Patient presents with   Knee Pain    Right knee, radiates down leg at time. For about 6 weeks, went shopping and came home knee swollen and painful     Vitals:   09/10/22 1519  BP: 108/60  Pulse: 70  SpO2: 99%   Vitals:   09/10/22 1519  Weight: 129 lb (58.5 kg)  Height: 5\' 6"  (1.676 m)   Body mass index is 20.82 kg/m.  No results found.   Independent interpretation of notes and tests performed by another provider:   Independent interpretation of right knee x-rays dated 09/10/2022 reveals mild tricompartmental osteoarthritis with primary focality to the lateral tibiofemoral articulation where there is bone-on-bone configuration on the [place] view, no acute osseous processes noted  Procedures performed:   None  Pertinent History, Exam, Impression, and Recommendations:   Problem List Items Addressed This Visit       Musculoskeletal and Integument   Primary osteoarthritis of right knee - Primary    Acute on chronic issue, patient has had on left knee pain in the past however never to this extent.  Most recent episode occurred roughly 6 weeks prior, atraumatic, noted after prolonged time on feet (shopping with friends).  Involved swelling, mild radiation distally, significant pain with weightbearing.  Denies any mechanical catching/locking, minor instability with prolonged standing.  Has been dosing 1 Aleve nightly and topical rub with limited response.  Maintains highly active lifestyle despite symptoms.  Examination reveals 1-2+ effusion, tenderness at the patellar facets, joint lines benign, range of motion from 0-125 degrees, limited by pain that recreates her stated symptomatology, patient does have laxity with  anterior drawer (contralateral benign), equivocal Lachman, negative McMurray's.  Patient's clinical history, examination, and radiographic findings are most consistent with patellofemoral osteoarthritis related arthralgia with associated effusion, cannot exclude an element of internal derangement with ACL laxity noted.  At this stage have advised scheduled diclofenac, activity restrictions, ice regimen, and close follow-up in 2 weeks.  If still symptomatic, aspiration and/or intra-articular corticosteroid injection to be considered.  Once symptoms allow, patient would benefit from home-based versus formal PT.      Relevant Medications   diclofenac (VOLTAREN) 50 MG EC tablet     Orders & Medications Meds ordered this encounter  Medications   diclofenac (VOLTAREN) 50 MG EC tablet    Sig: Take 1 tablet (50 mg total) by mouth 2 (two) times daily.    Dispense:  30 tablet    Refill:  0   No orders of the defined types were placed in this encounter.    Return in about 2 weeks (around 09/24/2022).     [name], MD   Primary Care Sports Medicine [place]

## 2022-09-10 NOTE — Patient Instructions (Signed)
-   Start diclofenac twice daily with food (no other NSAIDs while this medication - Apply ice at 20-minute intervals at least once daily and as needed - Limit activity as tolerated using the symptoms as a guide - Return for follow-up in 2 weeks

## 2022-09-10 NOTE — Assessment & Plan Note (Signed)
Acute on chronic issue, patient has had on left knee pain in the past however never to this extent.  Most recent episode occurred roughly 6 weeks prior, atraumatic, noted after prolonged time on feet (shopping with friends).  Involved swelling, mild radiation distally, significant pain with weightbearing.  Denies any mechanical catching/locking, minor instability with prolonged standing.  Has been dosing 1 Aleve nightly and topical rub with limited response.  Maintains highly active lifestyle despite symptoms.  Examination reveals 1-2+ effusion, tenderness at the patellar facets, joint lines benign, range of motion from 0-125 degrees, limited by pain that recreates her stated symptomatology, patient does have laxity with anterior drawer (contralateral benign), equivocal Lachman, negative McMurray's.  Patient's clinical history, examination, and radiographic findings are most consistent with patellofemoral osteoarthritis related arthralgia with associated effusion, cannot exclude an element of internal derangement with ACL laxity noted.  At this stage have advised scheduled diclofenac, activity restrictions, ice regimen, and close follow-up in 2 weeks.  If still symptomatic, aspiration and/or intra-articular corticosteroid injection to be considered.  Once symptoms allow, patient would benefit from home-based versus formal PT.

## 2022-09-24 ENCOUNTER — Ambulatory Visit: Payer: Medicare Other | Admitting: Family Medicine

## 2022-10-01 ENCOUNTER — Ambulatory Visit: Payer: Medicare Other | Admitting: Family Medicine

## 2022-10-01 ENCOUNTER — Encounter: Payer: Self-pay | Admitting: Family Medicine

## 2022-10-01 ENCOUNTER — Inpatient Hospital Stay: Payer: Self-pay | Admitting: Radiology

## 2022-10-01 VITALS — BP 118/80 | HR 78 | Ht 66.0 in | Wt 129.0 lb

## 2022-10-01 DIAGNOSIS — M1711 Unilateral primary osteoarthritis, right knee: Secondary | ICD-10-CM

## 2022-10-01 MED ORDER — TRIAMCINOLONE ACETONIDE 40 MG/ML IJ SUSP
40.0000 mg | Freq: Once | INTRAMUSCULAR | Status: AC
  Administered 2022-10-01: 40 mg via INTRAMUSCULAR

## 2022-10-01 MED ORDER — DICLOFENAC SODIUM 50 MG PO TBEC: 50.0000 mg | DELAYED_RELEASE_TABLET | Freq: Two times a day (BID) | ORAL | 0 refills | Status: DC | PRN

## 2022-10-01 NOTE — Patient Instructions (Signed)
You have just been given a cortisone injection to reduce pain and inflammation. After the injection you may notice immediate relief of pain as a result of the Lidocaine. It is important to rest the area of the injection for 24 to 48 hours after the injection. There is a possibility of some temporary increased discomfort and swelling for up to 72 hours until the cortisone begins to work. If you do have pain, simply rest the joint and use ice. If you can tolerate over the counter medications, you can try Tylenol, Aleve, or Advil for added relief per package instructions. - As above, relative rest x 2 days then return to normal activity - Use diclofenac up to twice daily (with food) as-needed until symptoms respond to cortisone - Start home exercises next week - Follow-up as-needed

## 2022-10-01 NOTE — Progress Notes (Signed)
     Primary Care / Sports Medicine Office Visit  Patient Information:  Patient ID: Marilyn Molina, female DOB: 1951/05/03 Age: 71 y.o. MRN: [id number]   Marilyn Molina is a pleasant 71 y.o. female presenting with the following:  Chief Complaint  Patient presents with   Primary osteoarthritis of right knee    Pt has been feeling better    Vitals:   10/01/22 1057  BP: 118/80  Pulse: 78  SpO2: 99%   Vitals:   10/01/22 1057  Weight: 129 lb (58.5 kg)  Height: 5\' 6"  (1.676 m)   Body mass index is 20.82 kg/m.     Independent interpretation of notes and tests performed by another provider:   None  Procedures performed:   Procedure: Aspiration and injection of right knee under ultrasound guidance. Ultrasound guidance utilized for in-plane approach to right suprapatellar bursa, effusion noted Samsung HS60 device utilized with permanent recording / reporting. Verbal informed consent obtained and verified. Skin prepped in a sterile fashion. Ethyl chloride for topical local analgesia.  Completed without difficulty and tolerated well. Aspirate: Noninflammatory fluid, 11 mL Medication: triamcinolone acetonide 40 mg/mL suspension for injection 1 mL total and 2 mL lidocaine 1% without epinephrine utilized for needle placement anesthetic Advised to contact for fevers/chills, erythema, induration, drainage, or persistent bleeding.   Pertinent History, Exam, Impression, and Recommendations:   Problem List Items Addressed This Visit       Musculoskeletal and Integument   Primary osteoarthritis of right knee - Primary    [name] presents for follow-up to acute exacerbation of chronic right knee osteoarthritis, at last visit scheduled diclofenac, relative rest, and close follow-up encouraged.  She states that symptoms have improved, was able to spend increased time on feet while traveling to [place], [place].  Examination today does reveal 1-2+ effusion, tenderness at the  lateral patellar facet, pain with maximal flexion achieved 110 degrees, anterior drawer equivocal, remainder of examination nonfocal.  Though her subjective improvement is noted, to treat her objective findings we discussed treatment strategies and she did elect to proceed with ultrasound-guided aspiration followed by corticosteroid injection.  I have advised home-based exercises, materials provided today.  From a medication management standpoint, diclofenac written for as needed usage.  She can follow-up as needed.  Recalcitrant symptoms to be addressed with viscosupplementation, consideration of advanced imaging.      Relevant Medications   diclofenac (VOLTAREN) 50 MG EC tablet   Other Relevant Orders   [name]     Orders & Medications Meds ordered this encounter  Medications   diclofenac (VOLTAREN) 50 MG EC tablet    Sig: Take 1 tablet (50 mg total) by mouth 2 (two) times daily as needed.    Dispense:  30 tablet    Refill:  0   triamcinolone acetonide (KENALOG-40) injection 40 mg   Orders Placed This Encounter  Procedures   [name]     Return if symptoms worsen or fail to improve.     [name], MD   Primary Care Sports Medicine [place]

## 2022-10-01 NOTE — Assessment & Plan Note (Addendum)
Ms. Brach presents for follow-up to acute exacerbation of chronic right knee osteoarthritis, at last visit scheduled diclofenac, relative rest, and close follow-up encouraged.  She states that symptoms have improved, was able to spend increased time on feet while traveling to [place], [place].  Examination today does reveal 1-2+ effusion, tenderness at the lateral patellar facet, pain with maximal flexion achieved 110 degrees, anterior drawer equivocal, remainder of examination nonfocal.  Though her subjective improvement is noted, to treat her objective findings we discussed treatment strategies and she did elect to proceed with ultrasound-guided aspiration followed by corticosteroid injection.  I have advised home-based exercises, materials provided today.  From a medication management standpoint, diclofenac written for as needed usage.  She can follow-up as needed.  Recalcitrant symptoms to be addressed with viscosupplementation, consideration of advanced imaging.

## 2022-10-19 ENCOUNTER — Other Ambulatory Visit: Payer: Self-pay | Admitting: Family Medicine

## 2022-10-19 DIAGNOSIS — I10 Essential (primary) hypertension: Secondary | ICD-10-CM

## 2022-11-07 DIAGNOSIS — H2511 Age-related nuclear cataract, right eye: Secondary | ICD-10-CM | POA: Diagnosis not present

## 2022-11-07 DIAGNOSIS — H25011 Cortical age-related cataract, right eye: Secondary | ICD-10-CM | POA: Diagnosis not present

## 2022-11-08 DIAGNOSIS — H25042 Posterior subcapsular polar age-related cataract, left eye: Secondary | ICD-10-CM | POA: Diagnosis not present

## 2022-11-08 DIAGNOSIS — H2512 Age-related nuclear cataract, left eye: Secondary | ICD-10-CM | POA: Diagnosis not present

## 2022-11-08 DIAGNOSIS — H25012 Cortical age-related cataract, left eye: Secondary | ICD-10-CM | POA: Diagnosis not present

## 2022-11-12 ENCOUNTER — Encounter: Payer: Self-pay | Admitting: Family Medicine

## 2022-11-12 ENCOUNTER — Ambulatory Visit (INDEPENDENT_AMBULATORY_CARE_PROVIDER_SITE_OTHER): Payer: Medicare Other | Admitting: Family Medicine

## 2022-11-12 VITALS — BP 112/58 | HR 75 | Ht 66.0 in | Wt 125.0 lb

## 2022-11-12 DIAGNOSIS — R110 Nausea: Secondary | ICD-10-CM

## 2022-11-12 DIAGNOSIS — J0100 Acute maxillary sinusitis, unspecified: Secondary | ICD-10-CM

## 2022-11-12 DIAGNOSIS — E785 Hyperlipidemia, unspecified: Secondary | ICD-10-CM | POA: Diagnosis not present

## 2022-11-12 DIAGNOSIS — J4521 Mild intermittent asthma with (acute) exacerbation: Secondary | ICD-10-CM | POA: Diagnosis not present

## 2022-11-12 DIAGNOSIS — F324 Major depressive disorder, single episode, in partial remission: Secondary | ICD-10-CM | POA: Diagnosis not present

## 2022-11-12 LAB — POCT INFLUENZA A/B
Influenza A, POC: NEGATIVE
Influenza B, POC: NEGATIVE

## 2022-11-12 MED ORDER — ATORVASTATIN CALCIUM 10 MG PO TABS: 10.0000 mg | ORAL_TABLET | Freq: Every day | ORAL | 1 refills | Status: AC

## 2022-11-12 MED ORDER — AZITHROMYCIN 250 MG PO TABS: ORAL_TABLET | ORAL | 0 refills | Status: AC

## 2022-11-12 MED ORDER — SERTRALINE HCL 100 MG PO TABS: 100.0000 mg | ORAL_TABLET | Freq: Every day | ORAL | 1 refills | Status: DC

## 2022-11-12 MED ORDER — MONTELUKAST SODIUM 10 MG PO TABS: 10.0000 mg | ORAL_TABLET | Freq: Every day | ORAL | 1 refills | Status: DC

## 2022-11-12 NOTE — Progress Notes (Signed)
Date:  11/12/2022   Name:  Marilyn Molina   DOB:  09/30/1951   MRN:  [id number]   Chief Complaint: Hyperlipidemia, Depression, Allergic Rhinitis , and Nausea (Had flu exposure)  Hyperlipidemia This is a chronic problem. The current episode started more than 1 year ago. The problem is controlled. Recent lipid tests were reviewed and are normal. She has no history of chronic renal disease, diabetes, hypothyroidism, liver disease, obesity or nephrotic syndrome. There are no known factors aggravating her hyperlipidemia. Pertinent negatives include no chest pain, focal sensory loss, focal weakness, leg pain, myalgias or shortness of breath. Current antihyperlipidemic treatment includes statins. The current treatment provides moderate improvement of lipids. There are no compliance problems.  Risk factors for coronary artery disease include dyslipidemia.  Depression        This is a chronic problem.  The current episode started more than 1 year ago.   The onset quality is gradual.   The problem has been gradually improving since onset.  Associated symptoms include no decreased concentration, no fatigue, no helplessness, no hopelessness, does not have insomnia, not irritable, no restlessness, no decreased interest, no appetite change, no body aches, no myalgias, no headaches, no indigestion, not sad and no suicidal ideas.     The symptoms are aggravated by nothing.  Past treatments include SSRIs - Selective serotonin reuptake inhibitors.  Compliance with treatment is good.  Previous treatment provided mild relief.   Pertinent negatives include no hypothyroidism.   Lab Results  Component Value Date   NA 143 01/07/2022   K 5.0 01/07/2022   CO2 22 01/07/2022   GLUCOSE 93 01/07/2022   BUN 13 01/07/2022   CREATININE 0.89 01/07/2022   CALCIUM 9.5 01/07/2022   EGFR 70 01/07/2022   GFRNONAA 84 01/10/2021   Lab Results  Component Value Date   CHOL 152 01/07/2022   HDL 69 01/07/2022   LDLCALC 67  01/07/2022   TRIG 83 01/07/2022   CHOLHDL 2.9 10/08/2018   Lab Results  Component Value Date   TSH 1.430 01/10/2021   No results found for: "HGBA1C" Lab Results  Component Value Date   WBC 10.9 (H) 01/10/2021   HGB 13.7 01/10/2021   HCT 40.5 01/10/2021   MCV 96 01/10/2021   PLT 389 01/10/2021   Lab Results  Component Value Date   ALT 11 01/07/2022   AST 15 01/07/2022   ALKPHOS 87 01/07/2022   BILITOT <0.2 01/07/2022   No results found for: "25OHVITD2", "25OHVITD3", "VD25OH"   Review of Systems  Constitutional: Negative.  Negative for appetite change, chills, fatigue, fever and unexpected weight change.  HENT:  Positive for postnasal drip, sinus pressure and sinus pain. Negative for congestion, ear discharge, ear pain, rhinorrhea and sore throat.   Respiratory:  Negative for shortness of breath and wheezing.   Cardiovascular:  Negative for chest pain.  Gastrointestinal:  Negative for abdominal pain and nausea.  Genitourinary:  Negative for difficulty urinating, dysuria, flank pain, frequency, hematuria and urgency.  Musculoskeletal:  Negative for arthralgias, back pain and myalgias.  Skin:  Negative for rash.  Neurological:  Negative for dizziness, focal weakness, weakness and headaches.  Hematological:  Negative for adenopathy. Does not bruise/bleed easily.  Psychiatric/Behavioral:  Positive for depression. Negative for decreased concentration, dysphoric mood and suicidal ideas. The patient is not nervous/anxious and does not have insomnia.     Patient Active Problem List   Diagnosis Date Noted   Primary osteoarthritis of right knee 09/10/2022  Centrilobular emphysema ([place]) 04/11/2022   Aortic atherosclerosis ([place]) 04/11/2022   Anxiety 11/21/2015   Essential hypertension 11/21/2015   Anxiety and depression 11/03/2012   Cardiomyopathy ([place]) 10/29/2011   Cigarette nicotine dependence without complication 26/71/2458   Tobacco abuse 10/29/2011    No Known  Allergies  Past Surgical History:  Procedure Laterality Date   OVARIAN CYST REMOVAL      Social History   Tobacco Use   Smoking status: Every Day    Packs/day: 1.00    Years: 51.00    Total pack years: 51.00    Types: Cigarettes   Smokeless tobacco: Never   Tobacco comments:    0.5 PPD 09/04/2022  Vaping Use   Vaping Use: Never used  Substance Use Topics   Alcohol use: No   Drug use: No     Medication list has been reviewed and updated.  Current Meds  Medication Sig   atorvastatin (LIPITOR) 10 MG tablet Take 1 tablet (10 mg total) by mouth daily.   Calcium Carbonate-Vit D-Min (CALCIUM 1200 PO) Take 1 tablet by mouth daily.   carvedilol (COREG) 3.125 MG tablet Take 1 tablet (3.125 mg total) by mouth 2 (two) times daily with a meal. (Patient taking differently: Take 3.125 mg by mouth 2 (two) times daily with a meal. 2 pills BID/ cardio at [facility])   cholecalciferol (VITAMIN D3) 25 MCG (1000 UNIT) tablet Take 1,000 Units by mouth daily.   ENTRESTO 24-26 MG Take 1 tablet by mouth 2 (two) times daily. [facility]   EPINEPHrine 0.3 mg/0.3 mL IJ SOAJ injection Inject 0.3 mg into the muscle as needed for anaphylaxis.   montelukast (SINGULAIR) 10 MG tablet Take 1 tablet (10 mg total) by mouth daily.   Multiple Vitamin (MULTI-VITAMIN) tablet Take 1 tablet by mouth daily.   sertraline (ZOLOFT) 100 MG tablet Take 1 tablet (100 mg total) by mouth daily.   umeclidinium-vilanterol (ANORO ELLIPTA) 62.5-25 MCG/ACT AEPB Inhale 1 puff into the lungs daily.   vitamin B-12 (CYANOCOBALAMIN) 1000 MCG tablet Take 1,000 mcg by mouth daily.   [DISCONTINUED] diclofenac (VOLTAREN) 50 MG EC tablet Take 1 tablet (50 mg total) by mouth 2 (two) times daily as needed.       11/12/2022   11:29 AM 08/27/2022   11:19 AM 07/08/2022    8:59 AM 04/11/2022   11:31 AM  GAD 7 : Generalized Anxiety Score  Nervous, Anxious, on Edge 0 0 0 0  Control/stop worrying 0 0 0 0  Worry too much - different things 0 0 0  0  Trouble relaxing 0 0 0 0  Restless 0 0 0 0  Easily annoyed or irritable 0 0 0 0  Afraid - awful might happen 0 0 0 0  Total GAD 7 Score 0 0 0 0  Anxiety Difficulty Not difficult at all Not difficult at all Not difficult at all Not difficult at all       11/12/2022   11:28 AM 08/27/2022   11:19 AM 07/08/2022    8:58 AM  Depression screen PHQ 2/9  Decreased Interest 0 0 1  Down, Depressed, Hopeless 0 0 0  PHQ - 2 Score 0 0 1  Altered sleeping 0 0 1  Tired, decreased energy 0 0 1  Change in appetite 0 0 0  Feeling bad or failure about yourself  0 0 0  Trouble concentrating 0 0 0  Moving slowly or fidgety/restless 0 0 0  Suicidal thoughts 0 0 0  PHQ-9  Score 0 0 3  Difficult doing work/chores Not difficult at all Not difficult at all Not difficult at all    BP Readings from Last 3 Encounters:  11/12/22 (!) 112/58  10/01/22 118/80  09/10/22 108/60    Physical Exam Vitals and nursing note reviewed. Exam conducted with a chaperone present.  Constitutional:      General: She is not irritable.She is not in acute distress.    Appearance: She is not diaphoretic.  HENT:     Head: Normocephalic and atraumatic.     Right Ear: Tympanic membrane and external ear normal.     Left Ear: Tympanic membrane and external ear normal.     Nose: Nose normal.     Mouth/Throat:     Mouth: Mucous membranes are moist.     Pharynx: No oropharyngeal exudate or posterior oropharyngeal erythema.  Eyes:     General:        Right eye: No discharge.        Left eye: No discharge.     Conjunctiva/sclera: Conjunctivae normal.     Pupils: Pupils are equal, round, and reactive to light.  Neck:     Thyroid: No thyromegaly.     Vascular: No JVD.  Cardiovascular:     Rate and Rhythm: Normal rate and regular rhythm.     Heart sounds: Normal heart sounds. No murmur heard.    No friction rub. No gallop.  Pulmonary:     Effort: Pulmonary effort is normal.     Breath sounds: Normal breath sounds. No  wheezing, rhonchi or rales.  Abdominal:     General: Bowel sounds are normal.     Palpations: Abdomen is soft. There is no mass.     Tenderness: There is no abdominal tenderness. There is no guarding.  Musculoskeletal:        General: Normal range of motion.     Cervical back: Normal range of motion and neck supple.  Lymphadenopathy:     Cervical: No cervical adenopathy.  Skin:    General: Skin is warm and dry.     Capillary Refill: Capillary refill takes less than 2 seconds.  Neurological:     Mental Status: She is alert.     Wt Readings from Last 3 Encounters:  11/12/22 125 lb (56.7 kg)  10/01/22 129 lb (58.5 kg)  09/10/22 129 lb (58.5 kg)    BP (!) 112/58   Pulse 75   Ht _0  (1.676 m)   Wt 125 lb (56.7 kg)   SpO2 97%   BMI 20.18 kg/m   Assessment and Plan:  1. Acute non-recurrent maxillary sinusitis New onset.  Persistent.  Stable.  In early stages we will proceed with azithromycin 2 tablets today followed by 1 a day for 4 days. - azithromycin (ZITHROMAX) 250 MG tablet; Take 2 tablets on day 1, then 1 tablet daily on days 2 through 5  Dispense: 6 tablet; Refill: 0  2. Hyperlipidemia, unspecified hyperlipidemia type .  Controlled.  Stable.  Continue atorvastatin 10 mg once a day. - atorvastatin (LIPITOR) 10 MG tablet; Take 1 tablet (10 mg total) by mouth daily.  Dispense: 90 tablet; Refill: 1  3. Mild intermittent asthma with acute exacerbation .  Controlled.  Stable.  Continue Singulair 10 mg once a day. - montelukast (SINGULAIR) 10 MG tablet; Take 1 tablet (10 mg total) by mouth daily.  Dispense: 90 tablet; Refill: 1  4. Major depressive disorder in partial remission, unspecified whether recurrent ([place])  Chronic.  Controlled.  Stable.  PHQ is 0 GAD score is 0 continue sertraline 100 mg once a day. - sertraline (ZOLOFT) 100 MG tablet; Take 1 tablet (100 mg total) by mouth daily.  Dispense: 90 tablet; Refill: 1  5. Nausea Onset of nausea patient has had exposure  to influenza will check influenza AB is noted to be negative. - POCT Influenza A/B    Otilio Miu, MD

## 2022-11-12 NOTE — Addendum Note (Signed)
Addended by: Everitt Amber on: 11/12/2022 01:56 PM   Modules accepted: Level of Service

## 2022-11-28 DIAGNOSIS — H2512 Age-related nuclear cataract, left eye: Secondary | ICD-10-CM | POA: Diagnosis not present

## 2022-11-28 DIAGNOSIS — H25012 Cortical age-related cataract, left eye: Secondary | ICD-10-CM | POA: Diagnosis not present

## 2022-11-28 DIAGNOSIS — H25042 Posterior subcapsular polar age-related cataract, left eye: Secondary | ICD-10-CM | POA: Diagnosis not present

## 2022-12-06 ENCOUNTER — Encounter: Payer: Self-pay | Admitting: Family Medicine

## 2022-12-06 ENCOUNTER — Ambulatory Visit (INDEPENDENT_AMBULATORY_CARE_PROVIDER_SITE_OTHER): Payer: Medicare Other | Admitting: Family Medicine

## 2022-12-06 VITALS — BP 118/78 | HR 76 | Ht 66.0 in | Wt 126.0 lb

## 2022-12-06 DIAGNOSIS — M1711 Unilateral primary osteoarthritis, right knee: Secondary | ICD-10-CM | POA: Diagnosis not present

## 2022-12-06 MED ORDER — CELECOXIB 100 MG PO CAPS: 100.0000 mg | ORAL_CAPSULE | Freq: Two times a day (BID) | ORAL | 0 refills | Status: DC

## 2022-12-06 NOTE — Patient Instructions (Signed)
-   Start Celebrex twice daily with food x 2 weeks then as-needed - Use hinged knee brace while on your feet x 2 weeks then as-needed - Ice x 20 minutes daily and as-needed - We will contact you once we hear back regarding "gel injections"

## 2022-12-06 NOTE — Progress Notes (Signed)
     Primary Care / Sports Medicine Office Visit  Patient Information:  Patient ID: Marilyn Molina, female DOB: [birth year] Age: 72 y.o. MRN: [id number]   Marilyn Molina is a pleasant 72 y.o. female presenting with the following:  Chief Complaint  Patient presents with   Primary osteoarthritis of right knee    Walked a lot over the holidays     Vitals:   12/06/22 0830  BP: 118/78  Pulse: 76  SpO2: 98%   Vitals:   12/06/22 0830  Weight: 126 lb (57.2 kg)  Height: 5\' 6"  (1.676 m)   Body mass index is 20.34 kg/m.  No results found.   Independent interpretation of notes and tests performed by another provider:   None  Procedures performed:   None  Pertinent History, Exam, Impression, and Recommendations:   Marilyn Molina was seen today for primary osteoarthritis of right knee.  Primary osteoarthritis of right knee Assessment & Plan: Chronic condition with recurrent symptoms noted over the past few days, of note did have significant increased activity a few weeks prior (visit to [place]), however had been asymptomatic for a few weeks until recurrence noted to the right knee while walking in the grocery store.  Had a painful click/pop sensation, did note swelling.  Has been dosing 4 mg ibuprofen twice daily without resolution.  Examination with 1-2+ effusion, subtle tenderness at the medial patellar facet, painful flexion past 110 degrees, nontender at the joint lines, no ligamentous laxity, McMurray's negative.  Given her constellation of findings, primary concern for recurrence of patellofemoral arthralgia in the setting of osteoarthritis.  I have advised patellar stabilizing hinged knee brace, scheduled Celebrex, and we will seek out authorization for viscosupplementation and coordinate follow-up accordingly.  Orders: -     Celecoxib; Take 1 capsule (100 mg total) by mouth 2 (two) times daily.  Dispense: 60 capsule; Refill: 0     Orders & Medications Meds ordered  this encounter  Medications   celecoxib (CELEBREX) 100 MG capsule    Sig: Take 1 capsule (100 mg total) by mouth 2 (two) times daily.    Dispense:  60 capsule    Refill:  0   No orders of the defined types were placed in this encounter.    No follow-ups on file.     [name], MD, [facility]   Primary Care Sports Medicine Primary Care and Sports Medicine at [facility]

## 2022-12-06 NOTE — Assessment & Plan Note (Signed)
Chronic condition with recurrent symptoms noted over the past few days, of note did have significant increased activity a few weeks prior (visit to [place]), however had been asymptomatic for a few weeks until recurrence noted to the right knee while walking in the grocery store.  Had a painful click/pop sensation, did note swelling.  Has been dosing 4 mg ibuprofen twice daily without resolution.  Examination with 1-2+ effusion, subtle tenderness at the medial patellar facet, painful flexion past 110 degrees, nontender at the joint lines, no ligamentous laxity, McMurray's negative.  Given her constellation of findings, primary concern for recurrence of patellofemoral arthralgia in the setting of osteoarthritis.  I have advised patellar stabilizing hinged knee brace, scheduled Celebrex, and we will seek out authorization for viscosupplementation and coordinate follow-up accordingly.

## 2022-12-24 DIAGNOSIS — L821 Other seborrheic keratosis: Secondary | ICD-10-CM | POA: Diagnosis not present

## 2022-12-24 DIAGNOSIS — L570 Actinic keratosis: Secondary | ICD-10-CM | POA: Diagnosis not present

## 2023-01-01 DIAGNOSIS — Z79899 Other long term (current) drug therapy: Secondary | ICD-10-CM | POA: Diagnosis not present

## 2023-01-01 DIAGNOSIS — I509 Heart failure, unspecified: Secondary | ICD-10-CM | POA: Diagnosis not present

## 2023-01-01 DIAGNOSIS — F17200 Nicotine dependence, unspecified, uncomplicated: Secondary | ICD-10-CM | POA: Diagnosis not present

## 2023-01-01 DIAGNOSIS — R0602 Shortness of breath: Secondary | ICD-10-CM | POA: Diagnosis not present

## 2023-01-01 DIAGNOSIS — F17210 Nicotine dependence, cigarettes, uncomplicated: Secondary | ICD-10-CM | POA: Diagnosis not present

## 2023-01-01 DIAGNOSIS — I110 Hypertensive heart disease with heart failure: Secondary | ICD-10-CM | POA: Diagnosis not present

## 2023-01-01 DIAGNOSIS — E782 Mixed hyperlipidemia: Secondary | ICD-10-CM | POA: Diagnosis not present

## 2023-01-01 DIAGNOSIS — I428 Other cardiomyopathies: Secondary | ICD-10-CM | POA: Diagnosis not present

## 2023-01-01 DIAGNOSIS — I429 Cardiomyopathy, unspecified: Secondary | ICD-10-CM | POA: Diagnosis not present

## 2023-01-02 ENCOUNTER — Other Ambulatory Visit: Payer: Self-pay | Admitting: Family Medicine

## 2023-01-02 DIAGNOSIS — M1711 Unilateral primary osteoarthritis, right knee: Secondary | ICD-10-CM

## 2023-01-02 NOTE — Telephone Encounter (Signed)
Requested medication (s) are due for refill today: yes  Requested medication (s) are on the active medication list: yes  Last refill:  12/06/22 #60  Future visit scheduled: yes  Notes to clinic:  overdue labs   Requested Prescriptions  Pending Prescriptions Disp Refills   celecoxib (CELEBREX) 100 MG capsule [Pharmacy Med Name: CELECOXIB 100 MG CAPSULE] 60 capsule 0    Sig: TAKE 1 CAPSULE BY MOUTH TWICE A DAY     Analgesics:  COX2 Inhibitors Failed - 01/02/2023  1:36 AM      Failed - Manual Review: Labs are only required if the patient has taken medication for more than 8 weeks.      Failed - HGB in normal range and within 360 days    Hemoglobin  Date Value Ref Range Status  01/10/2021 13.7 11.1 - 15.9 g/dL Final         Failed - HCT in normal range and within 360 days    Hematocrit  Date Value Ref Range Status  01/10/2021 40.5 34.0 - 46.6 % Final         Passed - Cr in normal range and within 360 days    Creatinine, Ser  Date Value Ref Range Status  01/07/2022 0.89 0.57 - 1.00 mg/dL Final         Passed - AST in normal range and within 360 days    AST  Date Value Ref Range Status  01/07/2022 15 0 - 40 IU/L Final         Passed - ALT in normal range and within 360 days    ALT  Date Value Ref Range Status  01/07/2022 11 0 - 32 IU/L Final         Passed - eGFR is 30 or above and within 360 days    GFR calc Af Amer  Date Value Ref Range Status  01/10/2021 96 >59 mL/min/1.73 Final    Comment:    **In accordance with recommendations from the NKF-ASN Task force,**   [facility] is in the process of updating its eGFR calculation to the   2021 CKD-EPI creatinine equation that estimates kidney function   without a race variable.    GFR calc non Af Amer  Date Value Ref Range Status  01/10/2021 84 >59 mL/min/1.73 Final   eGFR  Date Value Ref Range Status  01/07/2022 70 >59 mL/min/1.73 Final         Passed - Patient is not pregnant      Passed - Valid encounter  within last 12 months    Recent Outpatient Visits           3 weeks ago Primary osteoarthritis of right knee   [facility] [facility] [place], Earley Abide, MD   1 month ago Acute non-recurrent maxillary sinusitis   [place] [facility] [place], Deanna C, MD   3 months ago Primary osteoarthritis of right knee   [place] [facility] [place], Earley Abide, MD   3 months ago Primary osteoarthritis of right knee   [place] [facility] [place], Earley Abide, MD   4 months ago Acute non-recurrent maxillary sinusitis   [place] [facility] [place], Deanna C, MD       Future Appointments  In 4 months Juline Patch, MD [place] [facility], [facility]

## 2023-01-08 ENCOUNTER — Ambulatory Visit: Payer: Medicare Other | Admitting: Family Medicine

## 2023-01-24 ENCOUNTER — Telehealth: Payer: Self-pay | Admitting: Family Medicine

## 2023-01-24 NOTE — Telephone Encounter (Signed)
Patient needs to speak with "Danae Chen". Danae Chen was trying to get a shot injection approved through the patient's insurance, but the patient hasn't heard from "[place]". Please follow up with patient.

## 2023-01-29 NOTE — Telephone Encounter (Signed)
Patient called in checking on if PA is needed for gel injection, she states insurance needs to gdt gel injection for her knee. She says she is goin out of the country on Wednesday and is trying to get this done before then.  Please all back to advise

## 2023-02-03 NOTE — Telephone Encounter (Signed)
Pt coming in tomorrow for injection

## 2023-02-03 NOTE — Telephone Encounter (Signed)
Please review. If theres is anything I need to do for this let me know.  KP

## 2023-02-04 ENCOUNTER — Encounter: Payer: Self-pay | Admitting: Family Medicine

## 2023-02-04 ENCOUNTER — Inpatient Hospital Stay: Payer: Self-pay | Admitting: Radiology

## 2023-02-04 ENCOUNTER — Ambulatory Visit (INDEPENDENT_AMBULATORY_CARE_PROVIDER_SITE_OTHER): Payer: Medicare Other | Admitting: Family Medicine

## 2023-02-04 DIAGNOSIS — M1711 Unilateral primary osteoarthritis, right knee: Secondary | ICD-10-CM | POA: Diagnosis not present

## 2023-02-04 MED ORDER — TRAMADOL HCL 50 MG PO TABS: 50.0000 mg | ORAL_TABLET | Freq: Three times a day (TID) | ORAL | 0 refills | Status: DC | PRN

## 2023-02-04 MED ORDER — TRIAMCINOLONE ACETONIDE 32 MG IX SRER
32.0000 mg | Freq: Once | INTRA_ARTICULAR | Status: AC
  Administered 2023-02-04: 32 mg via INTRA_ARTICULAR

## 2023-02-04 NOTE — Progress Notes (Signed)
     Primary Care / Sports Medicine Office Visit  Patient Information:  Patient ID: Marilyn Molina, female DOB: 05-29-1951 Age: 72 y.o. MRN: [id number]   Marilyn Molina is a pleasant 72 y.o. female presenting with the following:  No chief complaint on file.   There were no vitals filed for this visit. There were no vitals filed for this visit. There is no height or weight on file to calculate BMI.     Independent interpretation of notes and tests performed by another provider:   None  Procedures performed:   Procedure:  Injection following aspiration of right knee under ultrasound guidance. Ultrasound guidance utilized for in-plane approach to suprapatellar right knee, effusion noted Samsung HS60 device utilized with permanent recording / reporting. Verbal informed consent obtained and verified. Skin prepped in a sterile fashion. Ethyl chloride for topical local analgesia.  Completed without difficulty and tolerated well. Aspirate: 16 mL noninflammatory fluid Medication: Zilretta suspension '32mg'$  (50m) for injection 5 mL total and 1.5 mL lidocaine 1% without epinephrine utilized for needle placement anesthetic Advised to contact for fevers/chills, erythema, induration, drainage, or persistent bleeding.   Pertinent History, Exam, Impression, and Recommendations:   Primary osteoarthritis of right knee Assessment & Plan: Chronic condition with ongoing symptomatology, since last visit during the 12/06/2022 timeframe, symptoms have been stable, continues to be symptomatic, utilizes hinged knee brace with benefit.  [place] and is requesting help with symptom control.  We did seek viscosupplementation authorization after last visit, was not able to get this covered.  Examination with 1-2+ effusion, tenderness diffusely about the joint, no laxity noted.  Given treatments to date, barriers with insurance, plan for escalation to Zilretta  intra-articular injection after aspiration.  Advised patient on brace usage, would benefit from formal PT which she will start after her return from [place]  Can follow-up as needed.  I did write tramadol for acute pain control until symptoms respond to Zilretta.  Orders: -     [place] JO[place] SPACE STRUCTURES LOW RIGHT; Future -     traMADol HCl; Take 1 tablet (50 mg total) by mouth every 8 (eight) hours as needed for up to 5 days.  Dispense: 15 tablet; Refill: 0 -     Ambulatory referral to Physical Therapy -     Triamcinolone Acetonide     Orders & Medications Meds ordered this encounter  Medications   traMADol (ULTRAM) 50 MG tablet    Sig: Take 1 tablet (50 mg total) by mouth every 8 (eight) hours as needed for up to 5 days.    Dispense:  15 tablet    Refill:  0   Triamcinolone Acetonide (ZILRETTA) intra-articular injection 32 mg   Orders Placed This Encounter  Procedures   [place] JOINT SPACE STRUCTURES LOW RIGHT   Ambulatory referral to Physical Therapy     Return if symptoms worsen or fail to improve.     JMontel Culver MD, [facility]  Primary Care Sports Medicine [facility]

## 2023-02-04 NOTE — Patient Instructions (Addendum)
You have just been given a Zilretta cortisone injection to reduce pain and inflammation. After the injection you may notice immediate relief of pain as a result of the Lidocaine. It is important to rest the area of the injection for 24 to 48 hours after the injection. There is a possibility of some temporary increased discomfort and swelling for up to 72 hours until the cortisone begins to work. If you do have pain, simply rest the joint and use ice. If you can tolerate over the counter medications, you can try Tylenol, Aleve, or Advil for added relief per package instructions. - As above, relative rest x 2 days and gradual return to normal activity - Utilize knee brace when on your feet as needed - Can use Tylenol, ice, rest for additional symptom control - Can use tramadol for severe pain not responding to the above until Zilretta/cortisone takes effect - Start physical therapy upon your return, referral coordinator will contact to schedule - Follow-up as needed

## 2023-02-04 NOTE — Assessment & Plan Note (Signed)
Chronic condition with ongoing symptomatology, since last visit during the 12/06/2022 timeframe, symptoms have been stable, continues to be symptomatic, utilizes hinged knee brace with benefit.  She is traveling to [place] tomorrow and is requesting help with symptom control.  We did seek viscosupplementation authorization after last visit, was not able to get this covered.  Examination with 1-2+ effusion, tenderness diffusely about the joint, no laxity noted.  Given treatments to date, barriers with insurance, plan for escalation to Zilretta intra-articular injection after aspiration.  Advised patient on brace usage, would benefit from formal PT which she will start after her return from [place].  Can follow-up as needed.  I did write tramadol for acute pain control until symptoms respond to Zilretta.

## 2023-02-18 ENCOUNTER — Encounter: Payer: Self-pay | Admitting: Family Medicine

## 2023-02-18 ENCOUNTER — Ambulatory Visit (INDEPENDENT_AMBULATORY_CARE_PROVIDER_SITE_OTHER): Payer: Medicare Other | Admitting: Family Medicine

## 2023-02-18 VITALS — BP 118/78 | HR 50 | Ht 66.0 in | Wt 123.0 lb

## 2023-02-18 DIAGNOSIS — R051 Acute cough: Secondary | ICD-10-CM | POA: Diagnosis not present

## 2023-02-18 DIAGNOSIS — J0100 Acute maxillary sinusitis, unspecified: Secondary | ICD-10-CM | POA: Diagnosis not present

## 2023-02-18 MED ORDER — AMOXICILLIN-POT CLAVULANATE 875-125 MG PO TABS: 1.0000 | ORAL_TABLET | Freq: Two times a day (BID) | ORAL | 0 refills | Status: DC

## 2023-02-18 MED ORDER — PROMETHAZINE-DM 6.25-15 MG/5ML PO SYRP: 5.0000 mL | ORAL_SOLUTION | Freq: Four times a day (QID) | ORAL | 0 refills | Status: DC | PRN

## 2023-02-18 NOTE — Progress Notes (Signed)
Date:  02/18/2023   Name:  Marilyn Molina   DOB:  17-Apr-1951   MRN:  [id number]   Chief Complaint: Sinusitis (Cough and cong, teeth hurting, dk. Yellow production)  Sinusitis This is a new problem. The current episode started in the past 7 days. The problem is unchanged. There has been no fever. The pain is mild. Associated symptoms include congestion, coughing, sinus pressure and a sore throat. Pertinent negatives include no chills, diaphoresis, ear pain, headaches, hoarse voice, neck pain, shortness of breath, sneezing or swollen glands. Past treatments include nothing. The treatment provided mild relief.    Lab Results  Component Value Date   NA 143 01/07/2022   K 5.0 01/07/2022   CO2 22 01/07/2022   GLUCOSE 93 01/07/2022   BUN 13 01/07/2022   CREATININE 0.89 01/07/2022   CALCIUM 9.5 01/07/2022   EGFR 70 01/07/2022   GFRNONAA 84 01/10/2021   Lab Results  Component Value Date   CHOL 152 01/07/2022   HDL 69 01/07/2022   LDLCALC 67 01/07/2022   TRIG 83 01/07/2022   CHOLHDL 2.9 10/08/2018   Lab Results  Component Value Date   TSH 1.430 01/10/2021   No results found for: "HGBA1C" Lab Results  Component Value Date   WBC 10.9 (H) 01/10/2021   HGB 13.7 01/10/2021   HCT 40.5 01/10/2021   MCV 96 01/10/2021   PLT 389 01/10/2021   Lab Results  Component Value Date   ALT 11 01/07/2022   AST 15 01/07/2022   ALKPHOS 87 01/07/2022   BILITOT <0.2 01/07/2022   No results found for: "25OHVITD2", "25OHVITD3", "VD25OH"   Review of Systems  Constitutional:  Negative for chills and diaphoresis.  HENT:  Positive for congestion, nosebleeds, postnasal drip, rhinorrhea, sinus pressure, sinus pain and sore throat. Negative for ear pain, hoarse voice and sneezing.   Respiratory:  Positive for cough. Negative for choking, chest tightness, shortness of breath, wheezing and stridor.   Cardiovascular:  Negative for chest pain, palpitations and leg swelling.  Musculoskeletal:   Negative for neck pain.  Neurological:  Negative for headaches.    Patient Active Problem List   Diagnosis Date Noted   Primary osteoarthritis of right knee 09/10/2022   Centrilobular emphysema ([place]) 04/11/2022   Aortic atherosclerosis ([place]) 04/11/2022   Anxiety 11/21/2015   Essential hypertension 11/21/2015   Anxiety and depression 11/03/2012   Cardiomyopathy ([place]) 10/29/2011   Cigarette nicotine dependence without complication [id number]   Tobacco abuse 10/29/2011    No Known Allergies  Past Surgical History:  Procedure Laterality Date   OVARIAN CYST REMOVAL      Social History   Tobacco Use   Smoking status: Every Day    Packs/day: 1.00    Years: 51.00    Additional pack years: 0.00    Total pack years: 51.00    Types: Cigarettes   Smokeless tobacco: Never   Tobacco comments:    0.5 PPD 09/04/2022  Vaping Use   Vaping Use: Never used  Substance Use Topics   Alcohol use: No   Drug use: No     Medication list has been reviewed and updated.  Current Meds  Medication Sig   atorvastatin (LIPITOR) 10 MG tablet Take 1 tablet (10 mg total) by mouth daily.   Calcium Carbonate-Vit D-Min (CALCIUM 1200 PO) Take 1 tablet by mouth daily.   carvedilol (COREG) 3.125 MG tablet Take 1 tablet (3.125 mg total) by mouth 2 (two) times daily with a meal. (Patient  taking differently: Take 3.125 mg by mouth 2 (two) times daily with a meal. 2 pills BID/ cardio at [facility])   celecoxib (CELEBREX) 100 MG capsule TAKE 1 CAPSULE BY MOUTH TWICE A DAY   cholecalciferol (VITAMIN D3) 25 MCG (1000 UNIT) tablet Take 1,000 Units by mouth daily.   ENTRESTO 24-26 MG Take 1 tablet by mouth 2 (two) times daily. [facility]   montelukast (SINGULAIR) 10 MG tablet Take 1 tablet (10 mg total) by mouth daily.   Multiple Vitamin (MULTI-VITAMIN) tablet Take 1 tablet by mouth daily.   sertraline (ZOLOFT) 100 MG tablet Take 1 tablet (100 mg total) by mouth daily.   umeclidinium-vilanterol (ANORO ELLIPTA)  62.5-25 MCG/ACT AEPB Inhale 1 puff into the lungs daily.   vitamin B-12 (CYANOCOBALAMIN) 1000 MCG tablet Take 1,000 mcg by mouth daily.       02/18/2023    4:21 PM 11/12/2022   11:29 AM 08/27/2022   11:19 AM 07/08/2022    8:59 AM  GAD 7 : Generalized Anxiety Score  Nervous, Anxious, on Edge 0 0 0 0  Control/stop worrying 0 0 0 0  Worry too much - different things 0 0 0 0  Trouble relaxing 0 0 0 0  Restless 0 0 0 0  Easily annoyed or irritable 0 0 0 0  Afraid - awful might happen 0 0 0 0  Total GAD 7 Score 0 0 0 0  Anxiety Difficulty Not difficult at all Not difficult at all Not difficult at all Not difficult at all       02/18/2023    4:21 PM 11/12/2022   11:28 AM 08/27/2022   11:19 AM  Depression screen PHQ 2/9  Decreased Interest 0 0 0  Down, Depressed, Hopeless 0 0 0  PHQ - 2 Score 0 0 0  Altered sleeping 0 0 0  Tired, decreased energy 0 0 0  Change in appetite 0 0 0  Feeling bad or failure about yourself  0 0 0  Trouble concentrating 0 0 0  Moving slowly or fidgety/restless 0 0 0  Suicidal thoughts 0 0 0  PHQ-9 Score 0 0 0  Difficult doing work/chores Not difficult at all Not difficult at all Not difficult at all    BP Readings from Last 3 Encounters:  02/18/23 118/78  12/06/22 118/78  11/12/22 (!) 112/58    Physical Exam Vitals and nursing note reviewed. Exam conducted with a chaperone present.  Constitutional:      General: She is not in acute distress.    Appearance: She is not diaphoretic.  HENT:     Head: Normocephalic and atraumatic.     Right Ear: Tympanic membrane, ear canal and external ear normal.     Left Ear: Tympanic membrane, ear canal and external ear normal.     Nose: Nose normal. No congestion or rhinorrhea.     Mouth/Throat:     Mouth: Mucous membranes are moist.     Pharynx: No oropharyngeal exudate or posterior oropharyngeal erythema.  Eyes:     General:        Right eye: No discharge.        Left eye: No discharge.      Conjunctiva/sclera: Conjunctivae normal.     Pupils: Pupils are equal, round, and reactive to light.  Neck:     Thyroid: No thyromegaly.     Vascular: No JVD.  Cardiovascular:     Rate and Rhythm: Normal rate and regular rhythm.  Heart sounds: Normal heart sounds. No murmur heard.    No friction rub. No gallop.  Pulmonary:     Effort: Pulmonary effort is normal.     Breath sounds: Normal breath sounds. No wheezing, rhonchi or rales.  Abdominal:     General: Bowel sounds are normal.     Palpations: Abdomen is soft. There is no mass.     Tenderness: There is no abdominal tenderness. There is no guarding or rebound.  Musculoskeletal:        General: Normal range of motion.     Cervical back: Normal range of motion and neck supple.  Lymphadenopathy:     Cervical: No cervical adenopathy.  Skin:    General: Skin is warm and dry.  Neurological:     Mental Status: She is alert.     Deep Tendon Reflexes: Reflexes are normal and symmetric.     Wt Readings from Last 3 Encounters:  02/18/23 123 lb (55.8 kg)  12/06/22 126 lb (57.2 kg)  11/12/22 125 lb (56.7 kg)    BP 118/78   Pulse (!) 50   Ht 5\' 6"  (1.676 m)   Wt 123 lb (55.8 kg)   SpO2 96%   BMI 19.85 kg/m   Assessment and Plan: 1. Acute non-recurrent maxillary sinusitis New onset.  Persistent.  Stable.  Patient returns from vacation with sinus congestion consistent with sinusitis.  Tenderness are noted over the maxillary sinuses.  We will treat with Augmentin 875 mg twice a day and recheck on an as-needed basis. - amoxicillin-clavulanate (AUGMENTIN) 875-125 MG tablet; Take 1 tablet by mouth 2 (two) times daily.  Dispense: 20 tablet; Refill: 0  2. Acute cough New onset.  Nonproductive.  Bothersome at night.  Will treat with promethazine dextromethorphan 1 teaspoon every 6 hours as needed.  Cough. - promethazine-dextromethorphan (PROMETHAZINE-DM) 6.25-15 MG/5ML syrup; Take 5 mLs by mouth 4 (four) times daily as needed.   Dispense: 118 mL; Refill: 0     Otilio Miu, MD

## 2023-02-19 ENCOUNTER — Ambulatory Visit (INDEPENDENT_AMBULATORY_CARE_PROVIDER_SITE_OTHER): Payer: Medicare Other

## 2023-02-19 VITALS — Ht 66.0 in | Wt 123.0 lb

## 2023-02-19 DIAGNOSIS — Z0000 Encounter for general adult medical examination without abnormal findings: Secondary | ICD-10-CM | POA: Diagnosis not present

## 2023-02-19 NOTE — Progress Notes (Signed)
I connected with  Marilyn Molina on 02/19/23 by a audio enabled telemedicine application and verified that I am speaking with the correct person using two identifiers.  Patient Location: Home  Provider Location: Office/Clinic  I discussed the limitations of evaluation and management by telemedicine. The patient expressed understanding and agreed to proceed.  Subjective:   Marilyn Molina is a 72 y.o. female who presents for Medicare Annual (Subsequent) preventive examination.  Review of Systems     Cardiac Risk Factors include: advanced age (>38men, >63 women);smoking/ tobacco exposure;dyslipidemia;hypertension     Objective:    There were no vitals filed for this visit. There is no height or weight on file to calculate BMI.     02/19/2023   10:30 AM 02/11/2022   11:29 AM 01/01/2021    8:53 AM 12/27/2019    9:03 AM 07/11/2015    2:01 PM  Advanced Directives  Does Patient Have a Medical Advance Directive? No Yes Yes No No  Type of Corporate treasurer of [place];Living will [place];Living will    Copy of [place] in Chart?  No - copy requested No - copy requested    Would patient like information on creating a medical advance directive? No - Patient declined   No - Patient declined No - patient declined information    Current Medications (verified) Outpatient Encounter Medications as of 02/19/2023  Medication Sig   amoxicillin-clavulanate (AUGMENTIN) 875-125 MG tablet Take 1 tablet by mouth 2 (two) times daily.   atorvastatin (LIPITOR) 10 MG tablet Take 1 tablet (10 mg total) by mouth daily.   Calcium Carbonate-Vit D-Min (CALCIUM 1200 PO) Take 1 tablet by mouth daily.   carvedilol (COREG) 3.125 MG tablet Take 1 tablet (3.125 mg total) by mouth 2 (two) times daily with a meal. (Patient taking differently: Take 3.125 mg by mouth 2 (two) times daily with a meal. 2 pills BID/ cardio at [facility])   celecoxib (CELEBREX) 100  MG capsule TAKE 1 CAPSULE BY MOUTH TWICE A DAY   cholecalciferol (VITAMIN D3) 25 MCG (1000 UNIT) tablet Take 1,000 Units by mouth daily.   ENTRESTO 24-26 MG Take 1 tablet by mouth 2 (two) times daily. [facility]   montelukast (SINGULAIR) 10 MG tablet Take 1 tablet (10 mg total) by mouth daily.   Multiple Vitamin (MULTI-VITAMIN) tablet Take 1 tablet by mouth daily.   promethazine-dextromethorphan (PROMETHAZINE-DM) 6.25-15 MG/5ML syrup Take 5 mLs by mouth 4 (four) times daily as needed.   sertraline (ZOLOFT) 100 MG tablet Take 1 tablet (100 mg total) by mouth daily.   umeclidinium-vilanterol (ANORO ELLIPTA) 62.5-25 MCG/ACT AEPB Inhale 1 puff into the lungs daily.   vitamin B-12 (CYANOCOBALAMIN) 1000 MCG tablet Take 1,000 mcg by mouth daily.   No facility-administered encounter medications on file as of 02/19/2023.    Allergies (verified) Patient has no known allergies.   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   High cholesterol    Hypertension    Past Surgical History:  Procedure Laterality Date   OVARIAN CYST REMOVAL     Family History  Problem Relation Age of Onset   Supraventricular tachycardia Mother    Stroke Father    Breast cancer Neg Hx    Social History   Socioeconomic History   Marital status: Widowed    Spouse name: Not on file   Number of children:  2   Years of education: Not on file   Highest education level: Not on file  Occupational History    Comment: retired  Tobacco Use   Smoking status: Every Day    Packs/day: 1.00    Years: 51.00    Additional pack years: 0.00    Total pack years: 51.00    Types: Cigarettes   Smokeless tobacco: Never   Tobacco comments:    0.5 PPD 09/04/2022  Vaping Use   Vaping Use: Never used  Substance and Sexual Activity   Alcohol use: No   Drug use: No   Sexual activity: Never  Other Topics Concern   Not on file  Social History Narrative   Pt's son and  granddaughter live with her.    Social Determinants of Health   Financial Resource Strain: Low Risk  (02/19/2023)   Overall Financial Resource Strain (CARDIA)    Difficulty of Paying Living Expenses: Not hard at all  Food Insecurity: No Food Insecurity (02/19/2023)   Hunger Vital Sign    Worried About Running Out of Food in the Last Year: Never true    Ran Out of Food in the Last Year: Never true  Transportation Needs: No Transportation Needs (02/19/2023)   PRAPARE - Hydrologist (Medical): No    Lack of Transportation (Non-Medical): No  Physical Activity: Insufficiently Active (02/19/2023)   Exercise Vital Sign    Days of Exercise per Week: 2 days    Minutes of Exercise per Session: 20 min  Stress: No Stress Concern Present (02/19/2023)   [place]    Feeling of Stress : Not at all  Social Connections: Moderately Isolated (02/19/2023)   Social Connection and Isolation Panel [NHANES]    Frequency of Communication with Friends and Family: More than three times a week    Frequency of Social Gatherings with Friends and Family: Three times a week    Attends Religious Services: More than 4 times per year    Active Member of Clubs or Organizations: No    Attends Archivist Meetings: Never    Marital Status: Widowed    Tobacco Counseling Ready to quit: Not Answered Counseling given: Not Answered Tobacco comments: 0.5 PPD 09/04/2022   Clinical Intake:  Pre-visit preparation completed: Yes  Pain : No/denies pain     Nutritional Risks: None Diabetes: No  How often do you need to have someone help you when you read instructions, pamphlets, or other written materials from your doctor or pharmacy?: 1 - Never  Diabetic?no  Interpreter Needed?: No  Information entered by :: Kirke Shaggy, LPN   Activities of Daily Living    02/19/2023   10:30 AM  In your present state of  health, do you have any difficulty performing the following activities:  Hearing? 0  Vision? 0  Difficulty concentrating or making decisions? 0  Walking or climbing stairs? 0  Dressing or bathing? 0  Doing errands, shopping? 0  Preparing Food and eating ? N  Using the Toilet? N  In the past six months, have you accidently leaked urine? N  Do you have problems with loss of bowel control? N  Managing your Medications? N  Managing your Finances? N  Housekeeping or managing your Housekeeping? N    Patient Care Team: Juline Patch, MD as PCP - General (Family Medicine) Tyler Pita, MD as Consulting Physician (Pulmonary Disease)  Indicate any recent Medical  Services you may have received from other than [facility] providers in the past year (date may be approximate).     Assessment:   This is a routine wellness examination for Marilyn Molina.  Hearing/Vision screen Hearing Screening - Comments:: No aids Vision Screening - Comments:: Readers- Dr.Nice  Dietary issues and exercise activities discussed: Current Exercise Habits: Home exercise routine, Type of exercise: walking, Time (Minutes): 20, Frequency (Times/Week): 2, Weekly Exercise (Minutes/Week): 40, Intensity: Mild   Goals Addressed             This Visit's Progress    DIET - EAT MORE FRUITS AND VEGETABLES         Depression Screen    02/19/2023   10:28 AM 02/18/2023    4:21 PM 11/12/2022   11:28 AM 08/27/2022   11:19 AM 07/08/2022    8:58 AM 04/11/2022   11:31 AM 02/11/2022   11:28 AM  PHQ 2/9 Scores  PHQ - 2 Score 0 0 0 0 1 0 0  PHQ- 9 Score 0 0 0 0 3 3 1     Fall Risk    02/19/2023   10:30 AM 02/18/2023    4:21 PM 11/12/2022   11:29 AM 08/27/2022   11:19 AM 07/08/2022    8:58 AM  Fall Risk   Falls in the past year? 0 0 0 0 0  Number falls in past yr: 0 0 0 0 0  Injury with Fall? 0 0 0 0 0  Risk for fall due to : No Fall Risks No Fall Risks  No Fall Risks No Fall Risks  Follow up Falls prevention discussed;Falls  evaluation completed Falls evaluation completed  Falls evaluation completed Falls evaluation completed    FALL RISK PREVENTION PERTAINING TO THE HOME:  Any stairs in or around the home? Yes  If so, are there any without handrails? No  Home free of loose throw rugs in walkways, pet beds, electrical cords, etc? Yes  Adequate lighting in your home to reduce risk of falls? Yes   ASSISTIVE DEVICES UTILIZED TO PREVENT FALLS:  Life alert? No  Use of a cane, walker or w/c? No  Grab bars in the bathroom? No  Shower chair or bench in shower? Yes  Elevated toilet seat or a handicapped toilet? No    Cognitive Function:        02/19/2023   10:34 AM 12/27/2019    9:06 AM  6CIT Screen  What Year? 0 points 0 points  What month? 0 points 0 points  What time? 0 points 0 points  Count back from 20 0 points 0 points  Months in reverse 0 points 0 points  Repeat phrase 0 points 0 points  Total Score 0 points 0 points    Immunizations Immunization History  Administered Date(s) Administered   Fluad Quad(high Dose 65+) 09/06/2020, 10/31/2021   Influenza, High Dose Seasonal PF 10/08/2018   Influenza, Quadrivalent, Recombinant, Inj, Pf 09/20/2019   Influenza, Seasonal, Injecte, Preservative Fre 11/22/2014   Influenza,inj,Quad PF,6+ Mos 11/21/2015, 08/20/2017, 08/27/2022   Influenza-Unspecified 09/02/2013, 08/20/2017, 08/03/2019   Moderna Covid-19 Vaccine Bivalent Booster 65yrs & up 10/31/2021   PFIZER(Purple Top)SARS-COV-2 Vaccination 12/30/2019, 01/25/2020, 10/03/2020   Pneumococcal Conjugate-13 01/01/2018   Pneumococcal Polysaccharide-23 04/08/2019    TDAP status: Due, Education has been provided regarding the importance of this vaccine. Advised may receive this vaccine at local pharmacy or Health Dept. Aware to provide a copy of the vaccination record if obtained from local pharmacy or Health Dept. Verbalized  acceptance and understanding.  Flu Vaccine status: Up to date  Pneumococcal  vaccine status: Up to date  Covid-19 vaccine status: Completed vaccines  Qualifies for Shingles Vaccine? Yes   Zostavax completed No   Shingrix Completed?: No.    Education has been provided regarding the importance of this vaccine. Patient has been advised to call insurance company to determine out of pocket expense if they have not yet received this vaccine. Advised may also receive vaccine at local pharmacy or Health Dept. Verbalized acceptance and understanding.  Screening Tests Health Maintenance  Topic Date Due   COVID-19 Vaccine (5 - 2023-24 season) 03/06/2023 (Originally 08/02/2022)   Zoster Vaccines- Shingrix (1 of 2) 05/21/2023 (Originally 08/01/1970)   Lung Cancer Screening  07/26/2023   COLONOSCOPY (Pts 45-13yrs Insurance coverage will need to be confirmed)  09/01/2023   Medicare Annual Wellness (AWV)  02/19/2024   MAMMOGRAM  04/02/2024   Pneumonia Vaccine 26+ Years old  Completed   INFLUENZA VACCINE  Completed   DEXA SCAN  Completed   Hepatitis C Screening  Completed   HPV VACCINES  Aged Out   DTaP/Tdap/Td  Discontinued    Health Maintenance  There are no preventive care reminders to display for this patient.   Colorectal cancer screening: Type of screening: Colonoscopy. Completed 08/31/13. Repeat every 10 years  Mammogram status: Completed 04/02/22. Repeat every year  Bone Density status: Completed 04/02/22. Results reflect: Bone density results: OSTEOPOROSIS. Repeat every 2 years.  Lung Cancer Screening: (Low Dose CT Chest recommended if Age 45-80 years, 30 pack-year currently smoking OR have quit w/in 15years.) does qualify.   Lung Cancer Screening Referral: ordered 07/30/22  Additional Screening:  Hepatitis C Screening: does qualify; Completed 01/01/18  Vision Screening: Recommended annual ophthalmology exams for early detection of glaucoma and other disorders of the eye. Is the patient up to date with their annual eye exam?  Yes  Who is the provider or what is  the name of the office in which the patient attends annual eye exams? Dr.Nice If pt is not established with a provider, would they like to be referred to a provider to establish care? No .   Dental Screening: Recommended annual dental exams for proper oral hygiene  Community Resource Referral / Chronic Care Management: CRR required this visit?  No   CCM required this visit?  No      Plan:     I have personally reviewed and noted the following in the patient's chart:   Medical and social history Use of alcohol, tobacco or illicit drugs  Current medications and supplements including opioid prescriptions. Patient is not currently taking opioid prescriptions. Functional ability and status Nutritional status Physical activity Advanced directives List of other physicians Hospitalizations, surgeries, and ER visits in previous 12 months Vitals Screenings to include cognitive, depression, and falls Referrals and appointments  In addition, I have reviewed and discussed with patient certain preventive protocols, quality metrics, and best practice recommendations. A written personalized care plan for preventive services as well as general preventive health recommendations were provided to patient.     Dionisio David, LPN   [id number]   Nurse Notes: none

## 2023-02-19 NOTE — Patient Instructions (Signed)
Marilyn Molina , Thank you for taking time to come for your Medicare Wellness Visit. I appreciate your ongoing commitment to your health goals. Please review the following plan we discussed and let me know if I can assist you in the future.   These are the goals we discussed:  Goals      DIET - EAT MORE FRUITS AND VEGETABLES     Increase physical activity     Recommend increasing physical activity to at least 3 days per week.         This is a list of the screening recommended for you and due dates:  Health Maintenance  Topic Date Due   COVID-19 Vaccine (5 - 2023-24 season) 03/06/2023*   Zoster (Shingles) Vaccine (1 of 2) 05/21/2023*   Screening for Lung Cancer  07/26/2023   Colon Cancer Screening  09/01/2023   Medicare Annual Wellness Visit  02/19/2024   Mammogram  04/02/2024   Pneumonia Vaccine  Completed   Flu Shot  Completed   DEXA scan (bone density measurement)  Completed   Hepatitis C Screening: USPSTF Recommendation to screen - Ages 62-79 yo.  Completed   HPV Vaccine  Aged Out   DTaP/Tdap/Td vaccine  Discontinued  *Topic was postponed. The date shown is not the original due date.    Advanced directives: no  Conditions/risks identified: none  Next appointment: Follow up in one year for your annual wellness visit 02/25/24 @ 9:45 am by phone   Preventive Care 65 Years and Older, Female Preventive care refers to lifestyle choices and visits with your health care provider that can promote health and wellness. What does preventive care include? A yearly physical exam. This is also called an annual well check. Dental exams once or twice a year. Routine eye exams. Ask your health care provider how often you should have your eyes checked. Personal lifestyle choices, including: Daily care of your teeth and gums. Regular physical activity. Eating a healthy diet. Avoiding tobacco and drug use. Limiting alcohol use. Practicing safe sex. Taking low-dose aspirin every  day. Taking vitamin and mineral supplements as recommended by your health care provider. What happens during an annual well check? The services and screenings done by your health care provider during your annual well check will depend on your age, overall health, lifestyle risk factors, and family history of disease. Counseling  Your health care provider may ask you questions about your: Alcohol use. Tobacco use. Drug use. Emotional well-being. Home and relationship well-being. Sexual activity. Eating habits. History of falls. Memory and ability to understand (cognition). Work and work Statistician. Reproductive health. Screening  You may have the following tests or measurements: Height, weight, and BMI. Blood pressure. Lipid and cholesterol levels. These may be checked every 5 years, or more frequently if you are over 63 years old. Skin check. Lung cancer screening. You may have this screening every year starting at age 81 if you have a 30-pack-year history of smoking and currently smoke or have quit within the past 15 years. Fecal occult blood test (FOBT) of the stool. You may have this test every year starting at age 82. Flexible sigmoidoscopy or colonoscopy. You may have a sigmoidoscopy every 5 years or a colonoscopy every 10 years starting at age 37. Hepatitis C blood test. Hepatitis B blood test. Sexually transmitted disease (STD) testing. Diabetes screening. This is done by checking your blood sugar (glucose) after you have not eaten for a while (fasting). You may have this done every 1-3  years. Bone density scan. This is done to screen for osteoporosis. You may have this done starting at age 72. Mammogram. This may be done every 1-2 years. Talk to your health care provider about how often you should have regular mammograms. Talk with your health care provider about your test results, treatment options, and if necessary, the need for more tests. Vaccines  Your health care  provider may recommend certain vaccines, such as: Influenza vaccine. This is recommended every year. Tetanus, diphtheria, and acellular pertussis (Tdap, Td) vaccine. You may need a Td booster every 10 years. Zoster vaccine. You may need this after age 27. Pneumococcal 13-valent conjugate (PCV13) vaccine. One dose is recommended after age 21. Pneumococcal polysaccharide (PPSV23) vaccine. One dose is recommended after age 75. Talk to your health care provider about which screenings and vaccines you need and how often you need them. This information is not intended to replace advice given to you by your health care provider. Make sure you discuss any questions you have with your health care provider. Document Released: 12/15/2015 Document Revised: 08/07/2016 Document Reviewed: 09/19/2015 Elsevier Interactive Patient Education  2017 [place] Prevention in the Home Falls can cause injuries. They can happen to people of all ages. There are many things you can do to make your home safe and to help prevent falls. What can I do on the outside of my home? Regularly fix the edges of walkways and driveways and fix any cracks. Remove anything that might make you trip as you walk through a door, such as a raised step or threshold. Trim any bushes or trees on the path to your home. Use bright outdoor lighting. Clear any walking paths of anything that might make someone trip, such as rocks or tools. Regularly check to see if handrails are loose or broken. Make sure that both sides of any steps have handrails. Any raised decks and porches should have guardrails on the edges. Have any leaves, snow, or ice cleared regularly. Use sand or salt on walking paths during winter. Clean up any spills in your garage right away. This includes oil or grease spills. What can I do in the bathroom? Use night lights. Install grab bars by the toilet and in the tub and shower. Do not use towel bars as grab  bars. Use non-skid mats or decals in the tub or shower. If you need to sit down in the shower, use a plastic, non-slip stool. Keep the floor dry. Clean up any water that spills on the floor as soon as it happens. Remove soap buildup in the tub or shower regularly. Attach bath mats securely with double-sided non-slip rug tape. Do not have throw rugs and other things on the floor that can make you trip. What can I do in the bedroom? Use night lights. Make sure that you have a light by your bed that is easy to reach. Do not use any sheets or blankets that are too big for your bed. They should not hang down onto the floor. Have a firm chair that has side arms. You can use this for support while you get dressed. Do not have throw rugs and other things on the floor that can make you trip. What can I do in the kitchen? Clean up any spills right away. Avoid walking on wet floors. Keep items that you use a lot in easy-to-reach places. If you need to reach something above you, use a strong step stool that has a grab  bar. Keep electrical cords out of the way. Do not use floor polish or wax that makes floors slippery. If you must use wax, use non-skid floor wax. Do not have throw rugs and other things on the floor that can make you trip. What can I do with my stairs? Do not leave any items on the stairs. Make sure that there are handrails on both sides of the stairs and use them. Fix handrails that are broken or loose. Make sure that handrails are as long as the stairways. Check any carpeting to make sure that it is firmly attached to the stairs. Fix any carpet that is loose or worn. Avoid having throw rugs at the top or bottom of the stairs. If you do have throw rugs, attach them to the floor with carpet tape. Make sure that you have a light switch at the top of the stairs and the bottom of the stairs. If you do not have them, ask someone to add them for you. What else can I do to help prevent  falls? Wear shoes that: Do not have high heels. Have rubber bottoms. Are comfortable and fit you well. Are closed at the toe. Do not wear sandals. If you use a stepladder: Make sure that it is fully opened. Do not climb a closed stepladder. Make sure that both sides of the stepladder are locked into place. Ask someone to hold it for you, if possible. Clearly mark and make sure that you can see: Any grab bars or handrails. First and last steps. Where the edge of each step is. Use tools that help you move around (mobility aids) if they are needed. These include: Canes. Walkers. Scooters. Crutches. Turn on the lights when you go into a dark area. Replace any light bulbs as soon as they burn out. Set up your furniture so you have a clear path. Avoid moving your furniture around. If any of your floors are uneven, fix them. If there are any pets around you, be aware of where they are. Review your medicines with your doctor. Some medicines can make you feel dizzy. This can increase your chance of falling. Ask your doctor what other things that you can do to help prevent falls. This information is not intended to replace advice given to you by your health care provider. Make sure you discuss any questions you have with your health care provider. Document Released: 09/14/2009 Document Revised: 04/25/2016 Document Reviewed: 12/23/2014 Elsevier Interactive Patient Education  2017 [employer].

## 2023-03-16 ENCOUNTER — Other Ambulatory Visit: Payer: Self-pay | Admitting: Pulmonary Disease

## 2023-03-19 ENCOUNTER — Ambulatory Visit: Payer: Medicare Other | Attending: Family Medicine

## 2023-03-19 DIAGNOSIS — G8929 Other chronic pain: Secondary | ICD-10-CM | POA: Diagnosis not present

## 2023-03-19 DIAGNOSIS — M1711 Unilateral primary osteoarthritis, right knee: Secondary | ICD-10-CM | POA: Insufficient documentation

## 2023-03-19 DIAGNOSIS — M25561 Pain in right knee: Secondary | ICD-10-CM | POA: Insufficient documentation

## 2023-03-19 NOTE — Therapy (Signed)
OUTPATIENT PHYSICAL THERAPY LOWER EXTREMITY EVALUATION   Patient Name: Marilyn Molina MRN: [id number] DOB:[birth year], 72 y.o., female Today's Date: 03/19/2023  END OF SESSION:  PT End of Session - 03/19/23 1713     Visit Number 1    Number of Visits 13    Date for PT Re-Evaluation 04/30/23    Authorization Type Medicare    Authorization Time Period 10 visits    Progress Note Due on Visit 10    PT Start Time 1600    PT Stop Time 1645    PT Time Calculation (min) 45 min    Activity Tolerance Patient tolerated treatment well    Behavior During Therapy WFL for tasks assessed/performed             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 Yetta Barre, MD  REFERRING PROVIDER: Dr. Ashley Royalty, MD  REFERRING DIAG: Knee osteoarthritis (R LE)  THERAPY DIAG:  Primary osteoarthritis of right knee  Chronic pain of right knee  Rationale for Evaluation and Treatment: Rehabilitation  ONSET DATE: August 2023  SUBJECTIVE:   SUBJECTIVE STATEMENT: Pt c/o R knee pain and difficulty walking.  She noticed onset of sx intermittently for awhile (since Aug 2013).  She started noticing sx daily and went to see Dr. Ashley Royalty for a consult in October 2023.  She started taking an oral anti-inflammatory medication and this was helpful.  She had a f/u with him and her knee was swollen- knee was drained and had a cortisone shot.  She  did well for awhile and then had another injection in March, 2024.  She got some relief from both of the injections.  She was then referred to PT.  Difficulty getting up from the bath tub; difficulty getting up off the floor from sitting or kneeling; going up stairs when it's really sore  Aggravating factor: being up on her legs a lot she feels sore at the end of the day, stair ascent  Alleviating factors: steroid shots, ice, rest, brace (occasionally), biofreeze    PERTINENT HISTORY: No h/o R or L injuries PAIN:  Are you having pain? 0/10 currently; 8-10/10 at worst  PRECAUTIONS: None  WEIGHT BEARING RESTRICTIONS: No  FALLS:  Has patient fallen in last 6 months? No  LIVING ENVIRONMENT: Lives with: lives with their family- has son and grand daughter at home with her; 1 flight of stairs to get upstairs if needed; she mainly stays on first floor.  [place].     OCCUPATION: retired; she enjoying working with her plants/garden; going to her beach house for the summer; housework/decorating, has 2 dogs and 3 cats, baking too  PLOF: Independent  PATIENT GOALS: Pt's goal is to not have to do injections for knee; to get up from bathtub without assistance when she bathes the dog  NEXT MD VISIT: none scheduled  OBJECTIVE:   DIAGNOSTIC FINDINGS: x-ray (per chart review): R knee OA  PATIENT SURVEYS:  FOTO 69/70  COGNITION: Overall cognitive status: Within functional limits for tasks assessed     SENSATION: WFL  EDEMA:  Circumferential: no significant difference between R and L knee, small palpable area of swelling at R anterior/lateral knee jt line  MUSCLE LENGTH: Hamstring: 90/90 normal R and L  POSTURE:  R knee valgus >L  PALPATION: Decreased R quad m activation noted with quad set  LOWER EXTREMITY ROM:  Active ROM Right eval Left eval  Hip flexion    Hip extension    Hip abduction    Hip adduction    Hip internal rotation     Hip external rotation    Knee flexion 147 153  Knee extension 0 0  Ankle dorsiflexion    Ankle plantarflexion    Ankle inversion    Ankle eversion     (Blank rows = not tested)  LOWER EXTREMITY MMT:  MMT Right eval Left eval  Hip flexion 5 5  Hip extension    Hip abduction 4 4  Hip adduction    Hip internal rotation    Hip external rotation    Knee flexion 5 5  Knee extension 4+ 4+  Ankle dorsiflexion    Ankle plantarflexion    Ankle inversion    Ankle eversion     (Blank rows = not tested)   FUNCTIONAL TESTS:  Pt able to squat to reach floor, weight shifts off R LE though SLS R x 5-7 sec with increased sway, L 5-7 sec too  GAIT: No antalgic gait noted today; R knee valgus noted compared to L   TODAY'S TREATMENT:                                                                                                                              DATE: 03/19/23   Therapeutic Exercises: quad set R LE 5 second hold x10, SLR with quad set x10, PT verbal/tactile cues Pt education for technique, HEP, PATIENT EDUCATION:  Education details: PT POC/goals, HEP Person educated: Patient Education method: [employer] Education comprehension: verbalized understanding  HOME EXERCISE PROGRAM: Access Code: [email or web address] URL: [email or web address] Date: 03/19/2023 Prepared by: Max Fickle  Exercises - Supine Quad Set  - 2 x daily - 7 x weekly - 2 sets - 10 reps - 5 hold - Active Straight Leg Raise with Quad Set  - 1-2 x daily - 7 x weekly - 2 sets - 10 reps - 5 hold  ASSESSMENT:  CLINICAL IMPRESSION: Patient is a 72 y.o. F who was seen today for physical therapy evaluation and treatment for R knee OA.  She is motivated to participate in PT and should do well with a course of PT to address ROM/strength impairments and to facilitate improved ability to perform her daily activities without being limited by her R knee pain   OBJECTIVE IMPAIRMENTS: Abnormal  gait, decreased balance, decreased ROM, decreased strength, and pain.   ACTIVITY LIMITATIONS: lifting, squatting, stairs, transfers, and getting dog out of bathtub  PARTICIPATION LIMITATIONS: meal prep, cleaning, community activity, and yard work  PERSONAL FACTORS: Past/current experiences and Time since onset of injury/illness/exacerbation are also affecting patient's functional outcome.   REHAB POTENTIAL: Excellent  CLINICAL DECISION MAKING: Stable/uncomplicated  EVALUATION COMPLEXITY: Low   GOALS: Goals reviewed with patient? Yes  SHORT TERM GOALS: Target date: 04/01/23 Pt will demonstrate ability to perform quad set and SLR for quadriceps retraining with proper technique as her HEP Baseline: initiated HEP today at evaluation Goal status: INITIAL     LONG TERM GOALS: Target date: 04/30/23  Improve R LE strength 1/2 MMT grade to facilitate improved ability to transfer from sitting in bathtub or floor to standing independently without being limited by her R knee pain/weakness Baseline: asks her son to help Goal status: INITIAL  2.  Pt will be able to perform HEP independently for R knee ROM, strength, proprioception to independently manage R knee OA sx Baseline: not performing any HEP currently Goal status: INITIAL  3.  Pt will be able to ascend/descend 1 flight of stairs without being limited by R knee pain Baseline: limited Goal status: INITIAL    PLAN:  PT FREQUENCY: 1-2x/week  PT DURATION: 6 weeks  PLANNED INTERVENTIONS: Therapeutic exercises, Therapeutic activity, Neuromuscular re-education, Balance training, Gait training, Patient/Family education, Self Care, and Joint mobilization  PLAN FOR NEXT SESSION: R knee ROM/strengthening/proprioception, check how she responds to PAM next session; SLR progression, hip strength, SL CKC strength/proprioception  Max Fickle, PT, DPT, OCS  #[id number]  Ardine Bjork, PT 03/19/2023, 5:14 PM

## 2023-03-26 ENCOUNTER — Ambulatory Visit: Payer: Medicare Other

## 2023-03-26 DIAGNOSIS — M1711 Unilateral primary osteoarthritis, right knee: Secondary | ICD-10-CM

## 2023-03-26 DIAGNOSIS — G8929 Other chronic pain: Secondary | ICD-10-CM | POA: Diagnosis not present

## 2023-03-26 DIAGNOSIS — M25561 Pain in right knee: Secondary | ICD-10-CM | POA: Diagnosis not present

## 2023-03-26 NOTE — Therapy (Signed)
OUTPATIENT PHYSICAL THERAPY LOWER EXTREMITY TREATMENT   Patient Name: Marilyn Molina MRN: [id number] DOB:Aug 25, 1951, 72 y.o., female Today's Date: 03/26/2023  END OF SESSION:  PT End of Session - 03/26/23 1311     Visit Number 2    Number of Visits 13    Date for PT Re-Evaluation 04/30/23    Authorization Type Medicare    Authorization Time Period 10 visits    Progress Note Due on Visit 10    PT Start Time 1250    PT Stop Time 1335    PT Time Calculation (min) 45 min    Activity Tolerance Patient tolerated treatment well    Behavior During Therapy WFL for tasks assessed/performed             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 Yetta Barre, MD  REFERRING PROVIDER: Dr. Ashley Royalty, MD  REFERRING DIAG: Knee osteoarthritis (R LE)  THERAPY DIAG:  Primary osteoarthritis of right knee  Chronic pain of right knee  Rationale for Evaluation and Treatment: Rehabilitation  ONSET DATE: August 2023 From pt evaluation note: SUBJECTIVE:   SUBJECTIVE STATEMENT: Pt c/o R knee pain and difficulty walking.  She noticed onset of sx intermittently for awhile (since Aug 2013).  She started noticing sx daily and went to see Dr. Ashley Royalty for a consult in October 2023.  She started taking an oral anti-inflammatory medication and this was helpful.  She had a f/u with him and her knee was swollen- knee was drained and had  a cortisone shot.  She did well for awhile and then had another injection in March, 2024.  She got some relief from both of the injections.  She was then referred to PT.  Difficulty getting up from the bath tub; difficulty getting up off the floor from sitting or kneeling; going up stairs when it's really sore  Aggravating factor: being up on her legs a lot she feels sore at the end of the day, stair ascent  Alleviating factors: steroid shots, ice, rest, brace (occasionally), biofreeze    PERTINENT HISTORY: No h/o R or L injuries PAIN:  Are you having pain? 0/10 currently; 8-10/10 at worst  PRECAUTIONS: None  WEIGHT BEARING RESTRICTIONS: No  FALLS:  Has patient fallen in last 6 months? No  LIVING ENVIRONMENT: Lives with: lives with their family- has son and grand daughter at home with her; 1 flight of stairs to get upstairs if needed; she mainly stays on first floor.  [place].     OCCUPATION: retired; she enjoying working with her plants/garden; going to her beach house for the summer; housework/decorating, has 2 dogs and 3 cats, baking too  PLOF: Independent  PATIENT GOALS: Pt's goal is to not have to do injections for knee; to get up from bathtub without assistance when she bathes the dog  NEXT MD  VISIT: none scheduled  OBJECTIVE:   DIAGNOSTIC FINDINGS: x-ray (per chart review): R knee OA  PATIENT SURVEYS:  FOTO 69/70  COGNITION: Overall cognitive status: Within functional limits for tasks assessed     SENSATION: WFL  EDEMA:  Circumferential: no significant difference between R and L knee, small palpable area of swelling at R anterior/lateral knee jt line  MUSCLE LENGTH: Hamstring: 90/90 normal R and L  POSTURE:  R knee valgus >L  PALPATION: Decreased R quad m activation noted with quad set  LOWER EXTREMITY ROM:  Active ROM Right eval Left eval  Hip flexion    Hip extension    Hip abduction    Hip adduction    Hip  internal rotation    Hip external rotation    Knee flexion 147 153  Knee extension 0 0  Ankle dorsiflexion    Ankle plantarflexion    Ankle inversion    Ankle eversion     (Blank rows = not tested)  LOWER EXTREMITY MMT:  MMT Right eval Left eval  Hip flexion 5 5  Hip extension    Hip abduction 4 4  Hip adduction    Hip internal rotation    Hip external rotation    Knee flexion 5 5  Knee extension 4+ 4+  Ankle dorsiflexion    Ankle plantarflexion    Ankle inversion    Ankle eversion     (Blank rows = not tested)   FUNCTIONAL TESTS:  Pt able to squat to reach floor, weight shifts off R LE though SLS R x 5-7 sec with increased sway, L 5-7 sec too  GAIT: No antalgic gait noted today; R knee valgus noted compared to L   TODAY'S TREATMENT:                                                                                                                              DATE: 03/26/23  Subjective: Pt reports her knee is feeling pretty good up arrival.  She has not worked on [employer] yet.  Has been busy doing a lot of cooking and baking this week.  Did a lot of walking on her friend's farm property and her knee felt pretty good.  Pain 0/10  Therapeutic Exercises:  R LE quad set:5 second hold x10 SLR with quad set: x10, 2# ankle weight x15 S/L SLR: 2x12, 2# ankle weight Standing hamstring curl: 2x12, 4# ankle weight Seated knee extension: 2x12, 4# ankle weight Heel raises: 2x15, b/l SLS on airex: 5x5-10 seconds R and L SLS on flat ground: 3x 5-10 sec R and L Mini squat: focused on hip hinge 2x10 (high table)   Issued/progressed HEP(see below)  PATIENT EDUCATION:  Education details: PT POC/goals, HEP Person educated: Patient Education method: Explanation and Handouts Education comprehension: verbalized understanding  HOME EXERCISE PROGRAM: Access Code: [email or web address] URL: [email or web address] Date: 03/26/2023 Prepared by: Max Fickle  Exercises - Supine  Quad Set  - 2 x  daily - 7 x weekly - 2 sets - 10 reps - 5 hold - Active Straight Leg Raise with Quad Set  - 1-2 x daily - 7 x weekly - 2 sets - 10 reps - 5 hold - Sidelying Hip Abduction  - 1 x daily - 7 x weekly - 2 sets - 10 reps - Heel Raises with Counter Support  - 1 x daily - 7 x weekly - 2 sets - 10 reps - Mini Squat with Chair  - 1 x daily - 7 x weekly - 2 sets - 10 reps  ASSESSMENT:  CLINICAL IMPRESSION:  Patient reports no onset of R knee pain with strengthening exercises today.  Initiated a HEP for her to continue working on when she is not at PT.  Overall, she should continue to benefit from skilled PT to address ROM/strength impairments and to facilitate improved ability to perform her daily activities without being limited by her R knee pain   OBJECTIVE IMPAIRMENTS: Abnormal gait, decreased balance, decreased ROM, decreased strength, and pain.   ACTIVITY LIMITATIONS: lifting, squatting, stairs, transfers, and getting dog out of bathtub  PARTICIPATION LIMITATIONS: meal prep, cleaning, community activity, and yard work  PERSONAL FACTORS: Past/current experiences and Time since onset of injury/illness/exacerbation are also affecting patient's functional outcome.   REHAB POTENTIAL: Excellent  CLINICAL DECISION MAKING: Stable/uncomplicated  EVALUATION COMPLEXITY: Low   GOALS: Goals reviewed with patient? Yes  SHORT TERM GOALS: Target date: 04/01/23 Pt will demonstrate ability to perform quad set and SLR for quadriceps retraining with proper technique as her HEP Baseline: initiated HEP today at evaluation Goal status: INITIAL     LONG TERM GOALS: Target date: 04/30/23  Improve R LE strength 1/2 MMT grade to facilitate improved ability to transfer from sitting in bathtub or floor to standing independently without being limited by her R knee pain/weakness Baseline: asks her son to help Goal status: INITIAL  2.  Pt will be able to perform HEP independently for R knee ROM,  strength, proprioception to independently manage R knee OA sx Baseline: not performing any HEP currently Goal status: INITIAL  3.  Pt will be able to ascend/descend 1 flight of stairs without being limited by R knee pain Baseline: limited Goal status: INITIAL    PLAN:  PT FREQUENCY: 1-2x/week  PT DURATION: 6 weeks  PLANNED INTERVENTIONS: Therapeutic exercises, Therapeutic activity, Neuromuscular re-education, Balance training, Gait training, Patient/Family education, Self Care, and Joint mobilization  PLAN FOR NEXT SESSION: R knee ROM/strengthening/proprioception, SLR progression, hip strength, SL CKC strength/proprioception, step ups  Max Fickle, PT, DPT, OCS  #[id number]  Ardine Bjork, PT 03/26/2023, 1:12 PM

## 2023-03-31 ENCOUNTER — Ambulatory Visit: Payer: Medicare Other

## 2023-03-31 DIAGNOSIS — M1711 Unilateral primary osteoarthritis, right knee: Secondary | ICD-10-CM | POA: Diagnosis not present

## 2023-03-31 DIAGNOSIS — G8929 Other chronic pain: Secondary | ICD-10-CM | POA: Diagnosis not present

## 2023-03-31 DIAGNOSIS — M25561 Pain in right knee: Secondary | ICD-10-CM | POA: Diagnosis not present

## 2023-03-31 NOTE — Therapy (Addendum)
OUTPATIENT PHYSICAL THERAPY LOWER EXTREMITY TREATMENT   Patient Name: Marilyn Molina MRN: [id number] DOB:12-19-1950, 72 y.o., female Today's Date: 03/31/2023  END OF SESSION:  PT End of Session - 03/31/23 1422     Visit Number 3    Number of Visits 13    Date for PT Re-Evaluation 04/30/23    Authorization Type Medicare    Authorization Time Period 10 visits    Progress Note Due on Visit 10    PT Start Time 1420    PT Stop Time 1500    PT Time Calculation (min) 40 min    Activity Tolerance Patient tolerated treatment well    Behavior During Therapy WFL for tasks assessed/performed             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 Yetta Barre, MD  REFERRING PROVIDER: Dr. Ashley Royalty, MD  REFERRING DIAG: Knee osteoarthritis (R LE)  THERAPY DIAG:  Primary osteoarthritis of right knee  Chronic pain of right knee  Rationale for Evaluation and Treatment: Rehabilitation  ONSET DATE: August 2023 From pt evaluation note: SUBJECTIVE:   SUBJECTIVE STATEMENT: Pt c/o R knee pain and difficulty walking.  She noticed onset of sx intermittently for awhile (since Aug 2013).  She started noticing sx daily and went to see Dr. Ashley Royalty for a consult in October 2023.  She started taking an oral anti-inflammatory medication and this was helpful.  She had a f/u with him and her knee was swollen- knee  was drained and had a cortisone shot.  She did well for awhile and then had another injection in March, 2024.  She got some relief from both of the injections.  She was then referred to PT.  Difficulty getting up from the bath tub; difficulty getting up off the floor from sitting or kneeling; going up stairs when it's really sore  Aggravating factor: being up on her legs a lot she feels sore at the end of the day, stair ascent  Alleviating factors: steroid shots, ice, rest, brace (occasionally), biofreeze    PERTINENT HISTORY: No h/o R or L injuries PAIN:  Are you having pain? 0/10 currently; 8-10/10 at worst  PRECAUTIONS: None  WEIGHT BEARING RESTRICTIONS: No  FALLS:  Has patient fallen in last 6 months? No  LIVING ENVIRONMENT: Lives with: lives with their family- has son and grand daughter at home with her; 1 flight of stairs to get upstairs if needed; she mainly stays on first floor.  [place].     OCCUPATION: retired; she enjoying working with her plants/garden; going to her beach house for the summer; housework/decorating, has 2 dogs and 3 cats, baking too  PLOF: Independent  PATIENT GOALS: Pt's goal is to not have to do injections for knee; to get up from bathtub without assistance when she bathes the dog  NEXT MD VISIT: none scheduled  OBJECTIVE:   DIAGNOSTIC FINDINGS: x-ray (per chart review): R knee OA  PATIENT SURVEYS:  FOTO 69/70  COGNITION: Overall cognitive status: Within functional limits for tasks assessed     SENSATION: WFL  EDEMA:  Circumferential: no significant difference between R and L knee, small palpable area of swelling at R anterior/lateral knee jt line  MUSCLE LENGTH: Hamstring: 90/90 normal R and L  POSTURE:  R knee valgus >L  PALPATION: Decreased R quad m activation noted with quad set  LOWER EXTREMITY ROM:  Active ROM Right eval Left eval  Hip flexion    Hip extension    Hip abduction     Hip adduction    Hip internal rotation    Hip external rotation    Knee flexion 147 153  Knee extension 0 0  Ankle dorsiflexion    Ankle plantarflexion    Ankle inversion    Ankle eversion     (Blank rows = not tested)  LOWER EXTREMITY MMT:  MMT Right eval Left eval  Hip flexion 5 5  Hip extension    Hip abduction 4 4  Hip adduction    Hip internal rotation    Hip external rotation    Knee flexion 5 5  Knee extension 4+ 4+  Ankle dorsiflexion    Ankle plantarflexion    Ankle inversion    Ankle eversion     (Blank rows = not tested)   FUNCTIONAL TESTS:  Pt able to squat to reach floor, weight shifts off R LE though SLS R x 5-7 sec with increased sway, L 5-7 sec too  GAIT: No antalgic gait noted today; R knee valgus noted compared to L   TODAY'S TREATMENT:                                                                                                                              DATE: 03/31/23  Subjective: Pt reports her knee is feeling pretty good up arrival.  She is planning to go to the beach this week to clean her house and get it ready for the summer.  She reports evening times have worked best for her to do HEP.  Pain 0/10  Therapeutic Exercises:  R LE quad set:5 second hold x10 SLR with quad set: x10, 2# ankle weight x15 S/L SLR: 2x12, 2# ankle weight- not today Standing hip abd; 3x12, 4# ankle weight b/l Standing hamstring curl: 3x12, 4# ankle weight Seated knee extension: 2x12, 4# ankle weight Heel raises: 2x15, b/l SLS on airex: 5x5-10 seconds R and L SLS on flat ground: 3x 5-10 sec R and L Mini squat: focused on hip hinge 2x10 (high table), 2x8 with 7# weight Bridges: 3 second hold, x12, x12 with pillow adduction  Stair assessment: ascend/descend 16 stairs with railing, able to use reciprocal pattern (has 16 steps at her beach house to enter/exit)  Reviewed HEP  PATIENT EDUCATION:  Education  details: PT POC/goals, HEP Person educated:  Patient Education method: Explanation and Handouts Education comprehension: verbalized understanding  HOME EXERCISE PROGRAM: Access Code: [email or web address] URL: [email or web address] Date: 03/26/2023 Prepared by: Max Fickle  Exercises - Supine Quad Set  - 2 x daily - 7 x weekly - 2 sets - 10 reps - 5 hold - Active Straight Leg Raise with Quad Set  - 1-2 x daily - 7 x weekly - 2 sets - 10 reps - 5 hold - Sidelying Hip Abduction  - 1 x daily - 7 x weekly - 2 sets - 10 reps - Heel Raises with Counter Support  - 1 x daily - 7 x weekly - 2 sets - 10 reps - Mini Squat with Chair  - 1 x daily - 7 x weekly - 2 sets - 10 reps  ASSESSMENT:  CLINICAL IMPRESSION:  Patient reports no onset of R knee pain with strengthening exercises today.  Able to perform good quad set activation during SLR.  Progressed proximal CKC strengthening during session.  Demonstrates safe stair navigation strategy too.  Overall, she should continue to benefit from skilled PT to address ROM/strength impairments and to facilitate improved ability to perform her daily activities without being limited by her R knee pain   OBJECTIVE IMPAIRMENTS: Abnormal gait, decreased balance, decreased ROM, decreased strength, and pain.   ACTIVITY LIMITATIONS: lifting, squatting, stairs, transfers, and getting dog out of bathtub  PARTICIPATION LIMITATIONS: meal prep, cleaning, community activity, and yard work  PERSONAL FACTORS: Past/current experiences and Time since onset of injury/illness/exacerbation are also affecting patient's functional outcome.   REHAB POTENTIAL: Excellent  CLINICAL DECISION MAKING: Stable/uncomplicated  EVALUATION COMPLEXITY: Low   GOALS: Goals reviewed with patient? Yes  SHORT TERM GOALS: Target date: 04/01/23 Pt will demonstrate ability to perform quad set and SLR for quadriceps retraining with proper technique as her HEP Baseline: initiated HEP today at evaluation Goal status:  INITIAL     LONG TERM GOALS: Target date: 04/30/23  Improve R LE strength 1/2 MMT grade to facilitate improved ability to transfer from sitting in bathtub or floor to standing independently without being limited by her R knee pain/weakness Baseline: asks her son to help Goal status: INITIAL  2.  Pt will be able to perform HEP independently for R knee ROM, strength, proprioception to independently manage R knee OA sx Baseline: not performing any HEP currently Goal status: INITIAL  3.  Pt will be able to ascend/descend 1 flight of stairs without being limited by R knee pain Baseline: limited Goal status: INITIAL    PLAN:  PT FREQUENCY: 1-2x/week  PT DURATION: 6 weeks  PLANNED INTERVENTIONS: Therapeutic exercises, Therapeutic activity, Neuromuscular re-education, Balance training, Gait training, Patient/Family education, Self Care, and Joint mobilization  PLAN FOR NEXT SESSION: R knee ROM/strengthening/proprioception, SLR progression, hip strength, SL CKC strength/proprioception, step ups  Max Fickle, PT, DPT, OCS  #[id number]  Ardine Bjork, PT 03/31/2023, 3:08 PM

## 2023-04-07 ENCOUNTER — Ambulatory Visit: Payer: Medicare Other

## 2023-04-14 ENCOUNTER — Other Ambulatory Visit: Payer: Self-pay | Admitting: Family Medicine

## 2023-04-14 DIAGNOSIS — R051 Acute cough: Secondary | ICD-10-CM

## 2023-04-14 DIAGNOSIS — J0100 Acute maxillary sinusitis, unspecified: Secondary | ICD-10-CM

## 2023-04-14 DIAGNOSIS — M1711 Unilateral primary osteoarthritis, right knee: Secondary | ICD-10-CM

## 2023-04-14 NOTE — Telephone Encounter (Signed)
Medication no longer on current medication list Requested Prescriptions  Pending Prescriptions Disp Refills   celecoxib (CELEBREX) 100 MG capsule [Pharmacy Med Name: CELECOXIB 100 MG CAPSULE] 60 capsule 0    Sig: TAKE 1 CAPSULE BY MOUTH TWICE A DAY     Analgesics:  COX2 Inhibitors Failed - 04/14/2023  2:37 AM      Failed - Manual Review: Labs are only required if the patient has taken medication for more than 8 weeks.      Failed - HGB in normal range and within 360 days    Hemoglobin  Date Value Ref Range Status  01/10/2021 13.7 11.1 - 15.9 g/dL Final         Failed - Cr in normal range and within 360 days    Creatinine, Ser  Date Value Ref Range Status  01/07/2022 0.89 0.57 - 1.00 mg/dL Final         Failed - HCT in normal range and within 360 days    Hematocrit  Date Value Ref Range Status  01/10/2021 40.5 34.0 - 46.6 % Final         Failed - AST in normal range and within 360 days    AST  Date Value Ref Range Status  01/07/2022 15 0 - 40 IU/L Final         Failed - ALT in normal range and within 360 days    ALT  Date Value Ref Range Status  01/07/2022 11 0 - 32 IU/L Final         Failed - eGFR is 30 or above and within 360 days    GFR calc Af Amer  Date Value Ref Range Status  01/10/2021 96 >59 mL/min/1.73 Final    Comment:    **In accordance with recommendations from the NKF-ASN Task force,**   [facility] is in the process of updating its eGFR calculation to the   2021 CKD-EPI creatinine equation that estimates kidney function   without a race variable.    GFR calc non Af Amer  Date Value Ref Range Status  01/10/2021 84 >59 mL/min/1.73 Final   eGFR  Date Value Ref Range Status  01/07/2022 70 >59 mL/min/1.73 Final         Passed - Patient is not pregnant      Passed - Valid encounter within last 12 months    Recent Outpatient Visits           1 month ago Acute non-recurrent maxillary sinusitis   [place] [facility]   2 months ago Primary osteoarthritis of right knee   [facility], Ocie Bob, MD   4 months ago Primary osteoarthritis of right knee   [facility], Ocie Bob, MD   5 months ago Acute non-recurrent maxillary sinusitis    [facility], MD   6 months ago Primary osteoarthritis of right knee   [facility] at [facility], Ocie Bob, MD       Future Appointments             In 1 month Duanne Limerick, MD [facility] at [facility]             traMADol Janean Sark)  50 MG tablet [Pharmacy Med Name: TRAMADOL HCL 50 MG TABLET] 15 tablet 0    Sig: TAKE 1 TABLET (50 MG TOTAL) BY MOUTH EVERY 8 (EIGHT) HOURS AS NEEDED FOR UP TO 5 DAYS.     Not Delegated - Analgesics:  Opioid Agonists Failed - 04/14/2023  2:37 AM      Failed - This refill cannot be delegated      Failed - Urine Drug Screen completed in last 360 days      Passed - Valid encounter within last 3 months    Recent Outpatient Visits           1 month ago Acute non-recurrent maxillary sinusitis   [place] [facility], MD   2 months ago Primary osteoarthritis of right knee   [place] [facility], Ocie Bob, MD   4 months ago Primary osteoarthritis of right knee   [facility], Ocie Bob, MD   5 months ago Acute non-recurrent maxillary sinusitis   [place] [facility], MD   6 months ago Primary osteoarthritis of right knee   [facility] at [facility], Ocie Bob, MD       Future  Appointments             In 1 month Duanne Limerick, MD [facility] Primary [facility] & [facility] Medicine at [facility], [facility]            Refused Prescriptions Disp Refills   diclofenac (VOLTAREN) 50 MG EC tablet [Pharmacy Med Name: DICLOFENAC SOD EC 50 MG TAB] 30 tablet 0    Sig: TAKE 1 TABLET BY MOUTH 2 TIMES DAILY AS NEEDED.     Analgesics:  NSAIDS Failed - 04/14/2023  2:37 AM      Failed - Manual Review: Labs are only required if the patient has taken medication for more than 8 weeks.      Failed - Cr in normal range and within 360 days    Creatinine, Ser  Date Value Ref Range Status  01/07/2022 0.89 0.57 - 1.00 mg/dL Final         Failed - HGB in normal range and within 360 days    Hemoglobin  Date Value Ref Range Status  01/10/2021 13.7 11.1 - 15.9 g/dL Final         Failed - PLT in normal range and within 360 days    Platelets  Date Value Ref Range Status  01/10/2021 389 150 - 450 x10E3/uL Final         Failed - HCT in normal range and within 360 days    Hematocrit  Date Value Ref Range Status  01/10/2021 40.5 34.0 - 46.6 % Final         Failed - eGFR is 30 or above and within 360 days    GFR calc Af Amer  Date Value Ref Range Status  01/10/2021 96 >59 mL/min/1.73 Final    Comment:    **In accordance with recommendations from the NKF-ASN Task force,**   [facility] is in the process of updating its eGFR calculation to the   2021 CKD-EPI creatinine equation that estimates kidney function   without a race variable.    GFR calc non Af Amer  Date Value Ref Range  Status  01/10/2021 84 >59 mL/min/1.73 Final   eGFR  Date Value Ref Range Status  01/07/2022 70 >59 mL/min/1.73 Final         Passed - Patient is not pregnant      Passed - Valid encounter within last 12 months    Recent Outpatient Visits           1 month ago Acute non-recurrent maxillary sinusitis   [place] [facility], MD   2 months  ago Primary osteoarthritis of right knee   [facility], Ocie Bob, MD   4 months ago Primary osteoarthritis of right knee   [facility], Ocie Bob, MD   5 months ago Acute non-recurrent maxillary sinusitis   [place] [facility], MD   6 months ago Primary osteoarthritis of right knee   [facility] Primary Care & Sports Medicine at [facility], Ocie Bob, MD       Future Appointments             In 1 month Duanne Limerick, MD [facility] Primary Care & Sports Medicine at [facility], [facility]

## 2023-04-14 NOTE — Telephone Encounter (Signed)
Requested medication (s) are due for refill today: routing for review  Requested medication (s) are on the active medication list: yes  Last refill:  multiple dates  Future visit scheduled: yes  Notes to clinic:   Medication not assigned to a protocol, review manually. Request is for antibiotics, routing for approval.     Requested Prescriptions  Pending Prescriptions Disp Refills   azithromycin (ZITHROMAX) 250 MG tablet [Pharmacy Med Name: AZITHROMYCIN 250 MG TABLET] 6 tablet 0    Sig: TAKE 2 TABLETS BY MOUTH TODAY, THEN TAKE 1 TABLET DAILY FOR 4 DAYS AS DIRECTED     Off-Protocol Failed - 04/14/2023  2:37 AM      Failed - Medication not assigned to a protocol, review manually.      Passed - Valid encounter within last 12 months    Recent Outpatient Visits           1 month ago Acute non-recurrent maxillary sinusitis   [place] [facility], MD   2 months ago Primary osteoarthritis of right knee   [facility], Ocie Bob, MD   4 months ago Primary osteoarthritis of right knee   [facility], Ocie Bob, MD   5 months ago Acute non-recurrent maxillary sinusitis   [place] [facility], MD   6 months ago Primary osteoarthritis of right knee   [facility], Ocie Bob, MD       Future Appointments             In 1 month Duanne Limerick, MD [facility] Primary Care & Sports Medicine at [facility], [facility]             amoxicillin-clavulanate (AUGMENTIN) 875-125 MG tablet [Pharmacy Med Name: AMOXICILLIN-CLAV 875-125MG  TAB] 20 tablet 0    Sig: TAKE 1 TABLET BY MOUTH TWICE A DAY     Off-Protocol Failed - 04/14/2023  2:37 AM      Failed - Medication not assigned to a protocol, review manually.       Passed - Valid encounter within last 12 months    Recent Outpatient Visits           1 month ago Acute non-recurrent maxillary sinusitis   [place] [facility], MD   2 months ago Primary osteoarthritis of right knee   [facility], Ocie Bob, MD   4 months ago Primary osteoarthritis of right knee   [facility], Ocie Bob, MD   5 months ago Acute non-recurrent maxillary sinusitis   [place] [facility], MD   6 months ago Primary osteoarthritis of right knee   [facility] & [facility] Medicine at [facility], Ocie Bob, MD       Future Appointments             In 1 month Duanne Limerick, MD [facility] Primary [facility] at [facility], [facility]             promethazine-dextromethorphan (PROMETHAZINE-DM) 6.25-15 MG/5ML syrup [Pharmacy Med Name: PROMETHAZINE-DM  6.25-15 MG/5ML] 118 mL 0    Sig: TAKE BY MOUTH 4 TIMES A DAY AS NEEDED     Ear, Nose, and Throat:  Antitussives/Expectorants Passed - 04/14/2023  2:37 AM      Passed - Valid encounter within last 12 months    Recent Outpatient Visits           1 month ago Acute non-recurrent maxillary sinusitis   [place] [facility], MD   2 months ago Primary osteoarthritis of right knee   [facility], Ocie Bob, MD   4 months ago Primary osteoarthritis of right knee   [facility], Ocie Bob, MD   5 months ago Acute non-recurrent maxillary sinusitis   [place] [facility], MD   6 months ago Primary osteoarthritis of right knee   [facility] Primary Care & Sports Medicine at [facility], Ocie Bob, MD       Future Appointments             In 1 month Duanne Limerick, MD [facility] Primary Care & Sports Medicine at [facility], [facility]

## 2023-04-14 NOTE — Telephone Encounter (Signed)
Requested medication (s) are due for refill today - no  Requested medication (s) are on the active medication list -no  Future visit scheduled -yes  Last refill: celecoxib 01/06/23 #60- fails lab protocol                 Tramadol- no longer on current medication list- non delegated Rx  Notes to clinic: see above  Requested Prescriptions  Pending Prescriptions Disp Refills   celecoxib (CELEBREX) 100 MG capsule [Pharmacy Med Name: CELECOXIB 100 MG CAPSULE] 60 capsule 0    Sig: TAKE 1 CAPSULE BY MOUTH TWICE A DAY     Analgesics:  COX2 Inhibitors Failed - 04/14/2023  2:37 AM      Failed - Manual Review: Labs are only required if the patient has taken medication for more than 8 weeks.      Failed - HGB in normal range and within 360 days    Hemoglobin  Date Value Ref Range Status  01/10/2021 13.7 11.1 - 15.9 g/dL Final         Failed - Cr in normal range and within 360 days    Creatinine, Ser  Date Value Ref Range Status  01/07/2022 0.89 0.57 - 1.00 mg/dL Final         Failed - HCT in normal range and within 360 days    Hematocrit  Date Value Ref Range Status  01/10/2021 40.5 34.0 - 46.6 % Final         Failed - AST in normal range and within 360 days    AST  Date Value Ref Range Status  01/07/2022 15 0 - 40 IU/L Final         Failed - ALT in normal range and within 360 days    ALT  Date Value Ref Range Status  01/07/2022 11 0 - 32 IU/L Final         Failed - eGFR is 30 or above and within 360 days    GFR calc Af Amer  Date Value Ref Range Status  01/10/2021 96 >59 mL/min/1.73 Final    Comment:    **In accordance with recommendations from the NKF-ASN Task force,**   [facility] is in the process of updating its eGFR calculation to the   2021 CKD-EPI creatinine equation that estimates kidney function   without a race variable.    GFR calc non Af Amer  Date Value Ref Range Status  01/10/2021 84 >59 mL/min/1.73 Final   eGFR  Date Value Ref Range Status  01/07/2022  70 >59 mL/min/1.73 Final         Passed - Patient is not pregnant      Passed - Valid encounter within last 12 months    Recent Outpatient Visits           1 month ago Acute non-recurrent maxillary sinusitis   [place] [facility]   2 months ago Primary osteoarthritis of right knee   [place] [facility], Ocie Bob, MD   4 months ago Primary osteoarthritis of right knee   [facility] at [facility], Ocie Bob, MD   5 months ago Acute non-recurrent maxillary sinusitis   [place] [facility], [facility]   6 months ago Primary osteoarthritis of right knee   [facility] Primary Care &  Sports Medicine at [employer] , Ocie Bob, MD       Future Appointments             In 1 month Duanne Limerick, MD [facility] at [facility]             traMADol (ULTRAM) 50 MG tablet [Pharmacy Med Name: TRAMADOL HCL 50 MG TABLET] 15 tablet 0    Sig: TAKE 1 TABLET (50 MG TOTAL) BY MOUTH EVERY 8 (EIGHT) HOURS AS NEEDED FOR UP TO 5 DAYS.     Not Delegated - Analgesics:  Opioid Agonists Failed - 04/14/2023  2:37 AM      Failed - This refill cannot be delegated      Failed - Urine Drug Screen completed in last 360 days      Passed - Valid encounter within last 3 months    Recent Outpatient Visits           1 month ago Acute non-recurrent maxillary sinusitis   [place] [facility], [facility]   2 months ago Primary osteoarthritis of right knee   [place] [facility], Ocie Bob, MD   4 months ago Primary osteoarthritis of right knee   [facility] & [facility] at [facility], Ocie Bob, MD   5 months ago Acute  non-recurrent maxillary sinusitis   [place] [facility], MD   6 months ago Primary osteoarthritis of right knee   [facility] Primary Care & Sports Medicine at [facility], Ocie Bob, MD       Future Appointments             In 1 month Duanne Limerick, MD [facility] Primary [facility] & Sports Medicine at [facility], [facility]            Refused Prescriptions Disp Refills   diclofenac (VOLTAREN) 50 MG EC tablet [Pharmacy Med Name: DICLOFENAC SOD EC 50 MG TAB] 30 tablet 0    Sig: TAKE 1 TABLET BY MOUTH 2 TIMES DAILY AS NEEDED.     Analgesics:  NSAIDS Failed - 04/14/2023  2:37 AM      Failed - Manual Review: Labs are only required if the patient has taken medication for more than 8 weeks.      Failed - Cr in normal range and within 360 days    Creatinine, Ser  Date Value Ref Range Status  01/07/2022 0.89 0.57 - 1.00 mg/dL Final         Failed - HGB in normal range and within 360 days    Hemoglobin  Date Value Ref Range Status  01/10/2021 13.7 11.1 - 15.9 g/dL Final         Failed - PLT in normal range and within 360 days    Platelets  Date Value Ref Range Status  01/10/2021 389 150 - 450 x10E3/uL Final         Failed - HCT in normal range and within 360 days    Hematocrit  Date Value Ref Range Status  01/10/2021 40.5 34.0 - 46.6 % Final         Failed - eGFR is 30 or above and within 360 days    GFR calc Af Amer  Date Value Ref Range Status  01/10/2021 96 >59  mL/min/1.73 Final    Comment:    **In accordance with recommendations from the NKF-ASN Task force,**   [facility] is in the process of updating its eGFR calculation to the   2021 CKD-EPI creatinine equation that estimates kidney function   without a race variable.    GFR calc non Af Amer  Date Value Ref Range Status  01/10/2021 84 >59 mL/min/1.73 Final   eGFR  Date Value Ref Range Status  01/07/2022 70 >59 mL/min/1.73 Final          Passed - Patient is not pregnant      Passed - Valid encounter within last 12 months    Recent Outpatient Visits           1 month ago Acute non-recurrent maxillary sinusitis   [place] [facility]   2 months ago Primary osteoarthritis of right knee   [place] [facility], Ocie Bob, MD   4 months ago Primary osteoarthritis of right knee   [facility] Primary Care & [facility] Medicine at [facility], Ocie Bob, MD   5 months ago Acute non-recurrent maxillary sinusitis   [place] [facility], MD   6 months ago Primary osteoarthritis of right knee   [facility] at [facility], Ocie Bob, MD       Future Appointments             In 1 month Duanne Limerick, MD [facility] Primary Care & Sports Medicine at [facility], [facility]               Requested Prescriptions  Pending Prescriptions Disp Refills   celecoxib (CELEBREX) 100 MG capsule [Pharmacy Med Name: CELECOXIB 100 MG CAPSULE] 60 capsule 0    Sig: TAKE 1 CAPSULE BY MOUTH TWICE A DAY     Analgesics:  COX2 Inhibitors Failed - 04/14/2023  2:37 AM      Failed - Manual Review: Labs are only required if the patient has taken medication for more than 8 weeks.      Failed - HGB in normal range and within 360 days    Hemoglobin  Date Value Ref Range Status  01/10/2021 13.7 11.1 - 15.9 g/dL Final         Failed - Cr in normal range and within 360 days    Creatinine, Ser  Date Value Ref Range Status  01/07/2022 0.89 0.57 - 1.00 mg/dL Final         Failed - HCT in normal range and within 360 days    Hematocrit  Date Value Ref Range Status  01/10/2021 40.5 34.0 - 46.6 % Final         Failed - AST in normal range and within 360 days    AST  Date Value Ref Range Status  01/07/2022 15 0 - 40 IU/L Final          Failed - ALT in normal range and within 360 days    ALT  Date Value Ref Range Status  01/07/2022 11 0 - 32 IU/L Final         Failed - eGFR is 30 or above and within 360 days    GFR calc Af Amer  Date Value Ref Range Status  01/10/2021 96 >59 mL/min/1.73 Final    Comment:    **  In accordance with recommendations from the NKF-ASN Task force,**   [facility] is in the process of updating its eGFR calculation to the   2021 CKD-EPI creatinine equation that estimates kidney function   without a race variable.    GFR calc non Af Amer  Date Value Ref Range Status  01/10/2021 84 >59 mL/min/1.73 Final   eGFR  Date Value Ref Range Status  01/07/2022 70 >59 mL/min/1.73 Final         Passed - Patient is not pregnant      Passed - Valid encounter within last 12 months    Recent Outpatient Visits           1 month ago Acute non-recurrent maxillary sinusitis   [place] [facility]   2 months ago Primary osteoarthritis of right knee   [place] [facility], Ocie Bob, MD   4 months ago Primary osteoarthritis of right knee   [facility] & [facility] at [facility], Ocie Bob, MD   5 months ago Acute non-recurrent maxillary sinusitis   [place] [facility], MD   6 months ago Primary osteoarthritis of right knee   [place] [facility], Ocie Bob, MD       Future Appointments             In 1 month Duanne Limerick, MD [facility] Primary Care & Sports Medicine at [facility]             traMADol (ULTRAM) 50 MG tablet [Pharmacy Med Name: TRAMADOL HCL 50 MG TABLET] 15 tablet 0    Sig: TAKE 1 TABLET (50 MG TOTAL) BY MOUTH EVERY 8 (EIGHT) HOURS AS NEEDED FOR UP TO 5 DAYS.     Not Delegated - Analgesics:  Opioid Agonists  Failed - 04/14/2023  2:37 AM      Failed - This refill cannot be delegated      Failed - Urine Drug Screen completed in last 360 days      Passed - Valid encounter within last 3 months    Recent Outpatient Visits           1 month ago Acute non-recurrent maxillary sinusitis   [place] [facility] MD   2 months ago Primary osteoarthritis of right knee   [place] [facility], Ocie Bob, MD   4 months ago Primary osteoarthritis of right knee   [facility] Primary Care & [facility] Medicine at [facility], Ocie Bob, MD   5 months ago Acute non-recurrent maxillary sinusitis   [place] [facility], MD   6 months ago Primary osteoarthritis of right knee   [facility] Primary Care & Sports Medicine at [facility], Ocie Bob, MD       Future Appointments             In 1 month Duanne Limerick, MD [facility] Primary Care & Sports Medicine at [facility], [facility]            Refused Prescriptions Disp Refills   diclofenac (VOLTAREN) 50 MG EC tablet [Pharmacy Med Name: DICLOFENAC SOD EC 50  MG TAB] 30 tablet 0    Sig: TAKE 1 TABLET BY MOUTH 2 TIMES DAILY AS NEEDED.     Analgesics:  NSAIDS Failed - 04/14/2023  2:37 AM      Failed - Manual Review: Labs are only required if the patient has taken medication for more than 8 weeks.      Failed - Cr in normal range and within 360 days    Creatinine, Ser  Date Value Ref Range Status  01/07/2022 0.89 0.57 - 1.00 mg/dL Final         Failed - HGB in normal range and within 360 days    Hemoglobin  Date Value Ref Range Status  01/10/2021 13.7 11.1 - 15.9 g/dL Final         Failed - PLT in normal range and within 360 days    Platelets  Date Value Ref Range Status  01/10/2021 389 150 - 450 x10E3/uL Final         Failed - HCT in normal range and within  360 days    Hematocrit  Date Value Ref Range Status  01/10/2021 40.5 34.0 - 46.6 % Final         Failed - eGFR is 30 or above and within 360 days    GFR calc Af Amer  Date Value Ref Range Status  01/10/2021 96 >59 mL/min/1.73 Final    Comment:    **In accordance with recommendations from the NKF-ASN Task force,**   [facility] is in the process of updating its eGFR calculation to the   2021 CKD-EPI creatinine equation that estimates kidney function   without a race variable.    GFR calc non Af Amer  Date Value Ref Range Status  01/10/2021 84 >59 mL/min/1.73 Final   eGFR  Date Value Ref Range Status  01/07/2022 70 >59 mL/min/1.73 Final         Passed - Patient is not pregnant      Passed - Valid encounter within last 12 months    Recent Outpatient Visits           1 month ago Acute non-recurrent maxillary sinusitis   [place] [facility]   2 months ago Primary osteoarthritis of right knee   [place] [facility] at [facility], Ocie Bob, MD   4 months ago Primary osteoarthritis of right knee   [facility] Primary Care & [facility] at [facility], Ocie Bob, MD   5 months ago Acute non-recurrent maxillary sinusitis    Primary Care & Sports Medicine at [facility], MD   6 months ago Primary osteoarthritis of right knee   [facility] Primary Care & Sports Medicine at [facility], Ocie Bob, MD       Future Appointments             In 1 month Duanne Limerick, MD [facility] Primary Care & [facility] Medicine at [facility], [facility]

## 2023-04-21 ENCOUNTER — Ambulatory Visit: Payer: Medicare Other | Attending: Family Medicine

## 2023-04-21 DIAGNOSIS — G8929 Other chronic pain: Secondary | ICD-10-CM

## 2023-04-21 DIAGNOSIS — M25561 Pain in right knee: Secondary | ICD-10-CM | POA: Diagnosis not present

## 2023-04-21 DIAGNOSIS — M1711 Unilateral primary osteoarthritis, right knee: Secondary | ICD-10-CM | POA: Insufficient documentation

## 2023-04-21 NOTE — Therapy (Signed)
OUTPATIENT PHYSICAL THERAPY LOWER EXTREMITY TREATMENT   Patient Name: Marilyn Molina MRN: [id number] DOB:04-15-1951, 72 y.o., female Today's Date: 04/21/2023  END OF SESSION:  PT End of Session - 04/21/23 1647     Visit Number 4    Number of Visits 13    Date for PT Re-Evaluation 04/30/23    Authorization Type Medicare    Authorization Time Period 10 visits    Progress Note Due on Visit 10    PT Start Time 1545    PT Stop Time 1630    PT Time Calculation (min) 45 min    Activity Tolerance Patient tolerated treatment well    Behavior During Therapy WFL for tasks assessed/performed             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 Yetta Barre, MD  REFERRING PROVIDER: Dr. Ashley Royalty, MD  REFERRING DIAG: Knee osteoarthritis (R LE)  THERAPY DIAG:  Primary osteoarthritis of right knee  Chronic pain of right knee  Rationale for Evaluation and Treatment: Rehabilitation  ONSET DATE: August 2023 From pt evaluation note: SUBJECTIVE:   SUBJECTIVE STATEMENT: Pt c/o R knee pain and difficulty walking.  She noticed onset of sx intermittently for awhile (since Aug 2013).  She started noticing sx daily and went to see Dr. Ashley Royalty for a consult in October 2023.  She started taking an oral anti-inflammatory medication and this was helpful.  She had a f/u with him and her knee was swollen- knee  was drained and had a cortisone shot.  She did well for awhile and then had another injection in March, 2024.  She got some relief from both of the injections.  She was then referred to PT.  Difficulty getting up from the bath tub; difficulty getting up off the floor from sitting or kneeling; going up stairs when it's really sore  Aggravating factor: being up on her legs a lot she feels sore at the end of the day, stair ascent  Alleviating factors: steroid shots, ice, rest, brace (occasionally), biofreeze    PERTINENT HISTORY: No h/o R or L injuries PAIN:  Are you having pain? 0/10 currently; 8-10/10 at worst  PRECAUTIONS: None  WEIGHT BEARING RESTRICTIONS: No  FALLS:  Has patient fallen in last 6 months? No  LIVING ENVIRONMENT: Lives with: lives with their family- has son and grand daughter at home with her; 1 flight of stairs to get upstairs if needed; she mainly stays on first floor.  She has a lot of stairs at her [place].     OCCUPATION: retired; she enjoying working with her plants/garden; going to her beach house for the summer; housework/decorating, has 2 dogs and 3 cats, baking too  PLOF: Independent  PATIENT GOALS: Pt's goal is to not have to do injections for knee; to get up from bathtub without assistance when she bathes the  dog  NEXT MD VISIT: none scheduled  OBJECTIVE:   DIAGNOSTIC FINDINGS: x-ray (per chart review): R knee OA  PATIENT SURVEYS:  FOTO 69/70  COGNITION: Overall cognitive status: Within functional limits for tasks assessed     SENSATION: WFL  EDEMA:  Circumferential: no significant difference between R and L knee, small palpable area of swelling at R anterior/lateral knee jt line  MUSCLE LENGTH: Hamstring: 90/90 normal R and L  POSTURE:  R knee valgus >L  PALPATION: Decreased R quad m activation noted with quad set  LOWER EXTREMITY ROM:  Active ROM Right eval Left eval  Hip flexion    Hip extension    Hip abduction     Hip adduction    Hip internal rotation    Hip external rotation    Knee flexion 147 153  Knee extension 0 0  Ankle dorsiflexion    Ankle plantarflexion    Ankle inversion    Ankle eversion     (Blank rows = not tested)  LOWER EXTREMITY MMT:  MMT Right eval Left eval  Hip flexion 5 5  Hip extension    Hip abduction 4 4  Hip adduction    Hip internal rotation    Hip external rotation    Knee flexion 5 5  Knee extension 4+ 4+  Ankle dorsiflexion    Ankle plantarflexion    Ankle inversion    Ankle eversion     (Blank rows = not tested)   FUNCTIONAL TESTS:  Pt able to squat to reach floor, weight shifts off R LE though SLS R x 5-7 sec with increased sway, L 5-7 sec too  GAIT: No antalgic gait noted today; R knee valgus noted compared to L   TODAY'S TREATMENT:                                                                                                                              DATE: 04/21/23  Subjective: Pt reports she deep cleaned her beach house and feels like her knee got more sore and a little swollen after that.  She has been working on her HEP.  Pain 1-2/10  Pt amb with slight antalgic gait on R LE upon arrival (+) limited end range flexion AROM due to pain/stiffness  Manual Therapy: PAM assessment of R tibiofemoral jt: pt notes decreased pain with distraction, A/P and med/lateral glide techniques; performed in various degrees of ROM seated and supine Improved ability to achieve end range knee flexion without pain after manual tx  Therapeutic Exercises:  R LE quad set:5 second hold x10 SLR with quad set: x10, 2# ankle weight x15 S/L SLR: 2x12, 2# ankle weight Standing hip abd; 3x12, 4# ankle weight b/l- not today Standing hamstring curl: 3x12, 4# ankle weight Seated knee extension: 3x12, 4# ankle weight Heel raises: 2x15, b/l SLS on airex: 5x5-10 seconds R and L Mini squat: focused on hip hinge 2x10 (high table), 2x8 with 7#  weight Bridges: 3  second hold, x12, x12 with pillow adduction  Pt ed for why the body mechanics required for deep cleaning (deep squat, repetitive squat, increased stair climbing in beach house) can contribute to increased joint compression forces in knee and likely contributed to her flare up; discussed how to manage flare ups with cryotherapy, self joint mobs, continuing to strengthen musculature around knee, and SL proprioception.  Reviewed HEP  PATIENT EDUCATION:  Education details: PT POC/goals, HEP Person educated: Patient Education method: Chief Technology Officer Education comprehension: verbalized understanding  HOME EXERCISE PROGRAM: Access Code: [email or web address] URL: [email or web address] Date: 03/26/2023 Prepared by: Max Fickle  Exercises - Supine Quad Set  - 2 x daily - 7 x weekly - 2 sets - 10 reps - 5 hold - Active Straight Leg Raise with Quad Set  - 1-2 x daily - 7 x weekly - 2 sets - 10 reps - 5 hold - Sidelying Hip Abduction  - 1 x daily - 7 x weekly - 2 sets - 10 reps - Heel Raises with Counter Support  - 1 x daily - 7 x weekly - 2 sets - 10 reps - Mini Squat with Chair  - 1 x daily - 7 x weekly - 2 sets - 10 reps  ASSESSMENT:  CLINICAL IMPRESSION:  Patient has had slight flare up in sx since her previous visit which she attributes to deep cleaning her multi-level beach home to get ready for the season.  She tolerated manual therapy techniques well and her knee ROM improved, was less painful, after tx.  Her gait mechanics improved by end of session too, no antalgic gait pattern observed after manual therapy and exercise  Overall, she should continue to benefit from skilled PT to address ROM/strength impairments and to facilitate improved ability to perform her daily activities without being limited by her R knee pain   OBJECTIVE IMPAIRMENTS: Abnormal gait, decreased balance, decreased ROM, decreased strength, and pain.   ACTIVITY LIMITATIONS: lifting, squatting, stairs,  transfers, and getting dog out of bathtub  PARTICIPATION LIMITATIONS: meal prep, cleaning, community activity, and yard work  PERSONAL FACTORS: Past/current experiences and Time since onset of injury/illness/exacerbation are also affecting patient's functional outcome.   REHAB POTENTIAL: Excellent  CLINICAL DECISION MAKING: Stable/uncomplicated  EVALUATION COMPLEXITY: Low   GOALS: Goals reviewed with patient? Yes  SHORT TERM GOALS: Target date: 04/01/23 Pt will demonstrate ability to perform quad set and SLR for quadriceps retraining with proper technique as her HEP Baseline: initiated HEP today at evaluation Goal status: INITIAL     LONG TERM GOALS: Target date: 04/30/23  Improve R LE strength 1/2 MMT grade to facilitate improved ability to transfer from sitting in bathtub or floor to standing independently without being limited by her R knee pain/weakness Baseline: asks her son to help Goal status: INITIAL  2.  Pt will be able to perform HEP independently for R knee ROM, strength, proprioception to independently manage R knee OA sx Baseline: not performing any HEP currently Goal status: INITIAL  3.  Pt will be able to ascend/descend 1 flight of stairs without being limited by R knee pain Baseline: limited Goal status: INITIAL    PLAN:  PT FREQUENCY: 1-2x/week  PT DURATION: 6 weeks  PLANNED INTERVENTIONS: Therapeutic exercises, Therapeutic activity, Neuromuscular re-education, Balance training, Gait training, Patient/Family education, Self Care, and Joint mobilization  PLAN FOR NEXT SESSION: R knee ROM/strengthening/proprioception, SLR progression, hip strength, SL CKC strength/proprioception, step ups  Max Fickle, PT, DPT, OCS  #  [id number]  Ardine Bjork, PT 04/21/2023, 4:54 PM

## 2023-04-23 ENCOUNTER — Ambulatory Visit: Payer: Medicare Other

## 2023-04-23 DIAGNOSIS — M1711 Unilateral primary osteoarthritis, right knee: Secondary | ICD-10-CM

## 2023-04-23 DIAGNOSIS — G8929 Other chronic pain: Secondary | ICD-10-CM

## 2023-04-23 DIAGNOSIS — M25561 Pain in right knee: Secondary | ICD-10-CM | POA: Diagnosis not present

## 2023-04-23 NOTE — Therapy (Signed)
OUTPATIENT PHYSICAL THERAPY LOWER EXTREMITY TREATMENT   Patient Name: Marilyn Molina MRN: [id number] DOB:1951-07-14, 72 y.o., female Today's Date: 04/23/2023  END OF SESSION:  PT End of Session - 04/23/23 1422     Visit Number 5    Number of Visits 13    Date for PT Re-Evaluation 04/30/23    Authorization Type Medicare    Authorization Time Period 10 visits    Progress Note Due on Visit 10    PT Start Time 1415    PT Stop Time 1500    PT Time Calculation (min) 45 min    Activity Tolerance Patient tolerated treatment well    Behavior During Therapy [facility] for tasks assessed/performed             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 Yetta Barre, MD  REFERRING PROVIDER: Dr. Ashley Royalty, MD  REFERRING DIAG: Knee osteoarthritis (R LE)  THERAPY DIAG:  Primary osteoarthritis of right knee  Chronic pain of right knee  Rationale for Evaluation and Treatment: Rehabilitation  ONSET DATE: August 2023 From pt evaluation note: SUBJECTIVE:   SUBJECTIVE STATEMENT: Pt c/o R knee pain and difficulty walking.  She noticed onset of sx intermittently for awhile (since Aug 2013).  She started noticing sx daily and went to see Dr. Ashley Royalty for a consult in October 2023.  She started taking an oral anti-inflammatory medication and this was helpful.  She had a f/u with him and her knee was swollen- knee  was drained and had a cortisone shot.  She did well for awhile and then had another injection in March, 2024.  She got some relief from both of the injections.  She was then referred to PT.  Difficulty getting up from the bath tub; difficulty getting up off the floor from sitting or kneeling; going up stairs when it's really sore  Aggravating factor: being up on her legs a lot she feels sore at the end of the day, stair ascent  Alleviating factors: steroid shots, ice, rest, brace (occasionally), biofreeze    PERTINENT HISTORY: No h/o R or L injuries PAIN:  Are you having pain? 0/10 currently; 8-10/10 at worst  PRECAUTIONS: None  WEIGHT BEARING RESTRICTIONS: No  FALLS:  Has patient fallen in last 6 months? No  LIVING ENVIRONMENT: Lives with: lives with their family- has son and grand daughter at home with her; 1 flight of stairs to get upstairs if needed; she mainly stays on first floor.  [place].     OCCUPATION: retired; she enjoying working with her plants/garden; going to her beach house for the summer; housework/decorating, has 2 dogs and 3 cats, baking too  PLOF: Independent  PATIENT GOALS: Pt's goal is to not have to do injections for knee; to get up from bathtub without assistance when she bathes the  dog  NEXT MD VISIT: none scheduled  OBJECTIVE:   DIAGNOSTIC FINDINGS: x-ray (per chart review): R knee OA  PATIENT SURVEYS:  FOTO 69/70  COGNITION: Overall cognitive status: Within functional limits for tasks assessed     SENSATION: WFL  EDEMA:  Circumferential: no significant difference between R and L knee, small palpable area of swelling at R anterior/lateral knee jt line  MUSCLE LENGTH: Hamstring: 90/90 normal R and L  POSTURE:  R knee valgus >L  PALPATION: Decreased R quad m activation noted with quad set  LOWER EXTREMITY ROM:  Active ROM Right eval Left eval  Hip flexion    Hip extension    Hip abduction     Hip adduction    Hip internal rotation    Hip external rotation    Knee flexion 147 153  Knee extension 0 0  Ankle dorsiflexion    Ankle plantarflexion    Ankle inversion    Ankle eversion     (Blank rows = not tested)  LOWER EXTREMITY MMT:  MMT Right eval Left eval  Hip flexion 5 5  Hip extension    Hip abduction 4 4  Hip adduction    Hip internal rotation    Hip external rotation    Knee flexion 5 5  Knee extension 4+ 4+  Ankle dorsiflexion    Ankle plantarflexion    Ankle inversion    Ankle eversion     (Blank rows = not tested)   FUNCTIONAL TESTS:  Pt able to squat to reach floor, weight shifts off R LE though SLS R x 5-7 sec with increased sway, L 5-7 sec too  GAIT: No antalgic gait noted today; R knee valgus noted compared to L   TODAY'S TREATMENT:                                                                                                                              DATE: 04/23/23  Subjective: Pt reports her knee felt better when she left last time.  She has been working on her HEP.  Pain 1-2/10  Pt amb with slight antalgic gait on R LE upon arrival (+) limited end range flexion AROM due to pain/stiffness  Manual Therapy: PAM assessment of R tibiofemoral jt: pt notes decreased pain with distraction, A/P and med/lateral glide techniques; performed in various degrees of ROM seated and supine Improved ability to achieve end range knee flexion without pain after manual tx  Therapeutic Exercises:  SLR with quad set: x20 S/L SLR: 2x12, 2# ankle weight- not today Standing hip abd; 3x12, 7# ankle weight Standing hamstring curl: 2x12, 7# ankle weight Seated knee extension: 2x12, 7# ankle weight Heel raises: 2x15, b/l SLS on airex: 5x5-10 seconds R and L Mini squat: focused on hip hinge 2x10 (high table), 2x8 with 7# weight Front step up, eccentric lower for quadriceps emphasis: 2x12, tactile/verbal cus to achieve full TKE via quad activation (6 inch  step,  b/l finger tip support) Bridges: 3 second hold, x12, x12 with pillow adduction- not today  Pt ed for why the body mechanics required for deep cleaning (deep squat, repetitive squat, increased stair climbing in beach house) can contribute to increased joint compression forces in knee and likely contributed to her flare up; discussed how to manage flare ups with cryotherapy, self joint mobs, continuing to strengthen musculature around knee, and SL proprioception.    PATIENT EDUCATION:  Education details: PT POC/goals, HEP, cryotherapy for pain/swelling as needed Person educated: Patient Education method: Explanation and Handouts Education comprehension: verbalized understanding  HOME EXERCISE PROGRAM: Access Code: [email or web address] URL: [email or web address] Date: 03/26/2023 Prepared by: Max Fickle  Exercises - Supine Quad Set  - 2 x daily - 7 x weekly - 2 sets - 10 reps - 5 hold - Active Straight Leg Raise with Quad Set  - 1-2 x daily - 7 x weekly - 2 sets - 10 reps - 5 hold - Sidelying Hip Abduction  - 1 x daily - 7 x weekly - 2 sets - 10 reps - Heel Raises with Counter Support  - 1 x daily - 7 x weekly - 2 sets - 10 reps - Mini Squat with Chair  - 1 x daily - 7 x weekly - 2 sets - 10 reps  ASSESSMENT:  CLINICAL IMPRESSION:  Patient able to tolerate LE strengthening progression well today; verbal/tactile cues are beneficial during CKC quadriceps strengthening to achieve better mm activation.Overall, she should continue to benefit from skilled PT to address ROM/strength impairments and to facilitate improved ability to perform her daily activities without being limited by her R knee pain   OBJECTIVE IMPAIRMENTS: Abnormal gait, decreased balance, decreased ROM, decreased strength, and pain.   ACTIVITY LIMITATIONS: lifting, squatting, stairs, transfers, and getting dog out of bathtub  PARTICIPATION LIMITATIONS: meal prep, cleaning, community activity, and yard  work  PERSONAL FACTORS: Past/current experiences and Time since onset of injury/illness/exacerbation are also affecting patient's functional outcome.   REHAB POTENTIAL: Excellent  CLINICAL DECISION MAKING: Stable/uncomplicated  EVALUATION COMPLEXITY: Low   GOALS: Goals reviewed with patient? Yes  SHORT TERM GOALS: Target date: 04/01/23 Pt will demonstrate ability to perform quad set and SLR for quadriceps retraining with proper technique as her HEP Baseline: initiated HEP today at evaluation Goal status: INITIAL     LONG TERM GOALS: Target date: 04/30/23  Improve R LE strength 1/2 MMT grade to facilitate improved ability to transfer from sitting in bathtub or floor to standing independently without being limited by her R knee pain/weakness Baseline: asks her son to help Goal status: INITIAL  2.  Pt will be able to perform HEP independently for R knee ROM, strength, proprioception to independently manage R knee OA sx Baseline: not performing any HEP currently Goal status: INITIAL  3.  Pt will be able to ascend/descend 1 flight of stairs without being limited by R knee pain Baseline: limited Goal status: INITIAL    PLAN:  PT FREQUENCY: 1-2x/week  PT DURATION: 6 weeks  PLANNED INTERVENTIONS: Therapeutic exercises, Therapeutic activity, Neuromuscular re-education, Balance training, Gait training, Patient/Family education, Self Care, and Joint mobilization  PLAN FOR NEXT SESSION: R knee ROM/strengthening/proprioception, SLR progression, hip strength, SL CKC strength/proprioception  Max Fickle, PT, DPT, OCS  #[id number]  Ardine Bjork, PT 04/23/2023, 4:15 PM

## 2023-04-30 ENCOUNTER — Ambulatory Visit: Payer: Medicare Other

## 2023-04-30 DIAGNOSIS — M25561 Pain in right knee: Secondary | ICD-10-CM | POA: Diagnosis not present

## 2023-04-30 DIAGNOSIS — M1711 Unilateral primary osteoarthritis, right knee: Secondary | ICD-10-CM | POA: Diagnosis not present

## 2023-04-30 DIAGNOSIS — G8929 Other chronic pain: Secondary | ICD-10-CM | POA: Diagnosis not present

## 2023-04-30 NOTE — Therapy (Signed)
OUTPATIENT PHYSICAL THERAPY LOWER EXTREMITY TREATMENT   Patient Name: Marilyn Molina MRN: [id number] DOB:08/22/1951, 72 y.o., female Today's Date: 04/30/2023  END OF SESSION:  PT End of Session - 04/30/23 1529     Visit Number 6    Number of Visits 13    Date for PT Re-Evaluation 04/30/23    Authorization Type Medicare    Authorization Time Period 10 visits    Progress Note Due on Visit 10    PT Start Time 1415    PT Stop Time 1510    PT Time Calculation (min) 55 min    Activity Tolerance Patient tolerated treatment well    Behavior During Therapy WFL for tasks assessed/performed              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 Yetta Barre, MD  REFERRING PROVIDER: Dr. Ashley Royalty, MD  REFERRING DIAG: Knee osteoarthritis (R LE)  THERAPY DIAG:  Primary osteoarthritis of right knee  Chronic pain of right knee  Rationale for Evaluation and Treatment: Rehabilitation  ONSET DATE: August 2023 From pt evaluation note: SUBJECTIVE:   SUBJECTIVE STATEMENT: Pt c/o R knee pain and difficulty walking.  She noticed onset of sx intermittently for awhile (since Aug 2013).  She started noticing sx daily and went to see Dr. Ashley Royalty for a consult in October 2023.  She started taking an oral anti-inflammatory medication and this was helpful.  She had a f/u with him and her knee was swollen- knee  was drained and had a cortisone shot.  She did well for awhile and then had another injection in March, 2024.  She got some relief from both of the injections.  She was then referred to PT.  Difficulty getting up from the bath tub; difficulty getting up off the floor from sitting or kneeling; going up stairs when it's really sore  Aggravating factor: being up on her legs a lot she feels sore at the end of the day, stair ascent  Alleviating factors: steroid shots, ice, rest, brace (occasionally), biofreeze    PERTINENT HISTORY: No h/o R or L injuries PAIN:  Are you having pain? 0/10 currently; 8-10/10 at worst  PRECAUTIONS: None  WEIGHT BEARING RESTRICTIONS: No  FALLS:  Has patient fallen in last 6 months? No  LIVING ENVIRONMENT: Lives with: lives with their family- has son and grand daughter at home with her; 1 flight of stairs to get upstairs if needed; she mainly stays on first floor.  [place].     OCCUPATION: retired; she enjoying working with her plants/garden; going to her beach house for the summer; housework/decorating, has 2 dogs and 3 cats, baking too  PLOF: Independent  PATIENT GOALS: Pt's goal is to not have to do injections for knee; to get up from bathtub without assistance when she bathes  the dog  NEXT MD VISIT: none scheduled  OBJECTIVE:   DIAGNOSTIC FINDINGS: x-ray (per chart review): R knee OA  PATIENT SURVEYS:  FOTO 69/70  COGNITION: Overall cognitive status: Within functional limits for tasks assessed     SENSATION: WFL  EDEMA:  Circumferential: no significant difference between R and L knee, small palpable area of swelling at R anterior/lateral knee jt line  MUSCLE LENGTH: Hamstring: 90/90 normal R and L  POSTURE:  R knee valgus >L  PALPATION: Decreased R quad m activation noted with quad set  LOWER EXTREMITY ROM:  Active ROM Right eval Left eval  Hip flexion    Hip extension    Hip abduction     Hip adduction    Hip internal rotation    Hip external rotation    Knee flexion 147 153  Knee extension 0 0  Ankle dorsiflexion    Ankle plantarflexion    Ankle inversion    Ankle eversion     (Blank rows = not tested)  LOWER EXTREMITY MMT:  MMT Right eval Left eval  Hip flexion 5 5  Hip extension    Hip abduction 4 4  Hip adduction    Hip internal rotation    Hip external rotation    Knee flexion 5 5  Knee extension 4+ 4+  Ankle dorsiflexion    Ankle plantarflexion    Ankle inversion    Ankle eversion     (Blank rows = not tested)   FUNCTIONAL TESTS:  Pt able to squat to reach floor, weight shifts off R LE though SLS R x 5-7 sec with increased sway, L 5-7 sec too  GAIT: No antalgic gait noted today; R knee valgus noted compared to L   TODAY'S TREATMENT:                                                                                                                              DATE: 04/23/23  Subjective: Pt reports her knee has been hurting more Saturday-Monday; today she woke up and her knee was hurting 10/10 pain so she took her prescribed tramadol for the first time.  It helped and she reports 4-5/10 pain now and is able to stand and walk with weight on her knee a little better than this morning.  Pain 4-5/10 currently  Pt amb with slight antalgic gait on R LE upon arrival (+) limited end range flexion AROM due to pain/stiffness (+) swelling along R suprapatellar region and medial knee  Manual Therapy: (seated position) PA mob: Tib-femoral jt A/P, P/A gr 1/2 30 seconds x3 ea, manual distraction 20 seconds x 10  Therapeutic Exercises:  SLR with quad set: x20 2# S/L SLR: 2x12, 2# ankle weight Standing R hip abd; 3x12, 5# ankle weight Standing R hamstring curl: 2x12, 5# ankle weight Seated R knee extension: 2x12, 5# ankle weight Heel raises: 2x15, b/l SLS on airex: 5x5-10 seconds R and L- not today Mini  squat: focused on hip hinge 2x10 (high table), 2x8  with 7# weight- not today Front step up, eccentric lower for quadriceps emphasis: 2x12, tactile/verbal cus to achieve full TKE via quad activation (6 inch step, b/l finger tip support)- not today Bridges: 3 second hold, x12, x12 with pillow adduction- not today Standing R hip extension: 2x12, 5# ankle weight  Pt ed for activity pacing, dividing household/kitchen tasks that require prolonged standing into shorter bouts with intermittent seated breaks (on high stool) to control pain/swelling in knee   Cold pack at end of session x 10 min with leg elevated for pain/swelling control   PATIENT EDUCATION:  Education details: PT POC/goals, HEP, cryotherapy for pain/swelling as needed Person educated: Patient Education method: Explanation and Handouts Education comprehension: verbalized understanding  HOME EXERCISE PROGRAM: Access Code: [email or web address] URL: [email or web address] Date: 03/26/2023 Prepared by: Max Fickle  Exercises - Supine Quad Set  - 2 x daily - 7 x weekly - 2 sets - 10 reps - 5 hold - Active Straight Leg Raise with Quad Set  - 1-2 x daily - 7 x weekly - 2 sets - 10 reps - 5 hold - Sidelying Hip Abduction  - 1 x daily - 7 x weekly - 2 sets - 10 reps - Heel Raises with Counter Support  - 1 x daily - 7 x weekly - 2 sets - 10 reps - Mini Squat with Chair  - 1 x daily - 7 x weekly - 2 sets - 10 reps  ASSESSMENT:  CLINICAL IMPRESSION:  Held off on progressing therapeutic exercises as pt arrived with increased anterior and medial knee swelling compared to last session.  Her antalgic gait pattern did improve at end of session.  Discussed strategies to continue managing swelling at home with cryotherapy, and for benefits of activity pacing/modifications and using her brace as needed for prolonged standing activities at home.  Overall, she should continue to benefit from skilled PT to address ROM/strength impairments and to facilitate improved ability to perform her daily  activities without being limited by her R knee pain   OBJECTIVE IMPAIRMENTS: Abnormal gait, decreased balance, decreased ROM, decreased strength, and pain.   ACTIVITY LIMITATIONS: lifting, squatting, stairs, transfers, and getting dog out of bathtub  PARTICIPATION LIMITATIONS: meal prep, cleaning, community activity, and yard work  PERSONAL FACTORS: Past/current experiences and Time since onset of injury/illness/exacerbation are also affecting patient's functional outcome.   REHAB POTENTIAL: Excellent  CLINICAL DECISION MAKING: Stable/uncomplicated  EVALUATION COMPLEXITY: Low   GOALS: Goals reviewed with patient? Yes  SHORT TERM GOALS: Target date: 04/01/23 Pt will demonstrate ability to perform quad set and SLR for quadriceps retraining with proper technique as her HEP Baseline: initiated HEP today at evaluation Goal status: INITIAL     LONG TERM GOALS: Target date: 04/30/23  Improve R LE strength 1/2 MMT grade to facilitate improved ability to transfer from sitting in bathtub or floor to standing independently without being limited by her R knee pain/weakness Baseline: asks her son to help Goal status: INITIAL  2.  Pt will be able to perform HEP independently for R knee ROM, strength, proprioception to independently manage R knee OA sx Baseline: not performing any HEP currently Goal status: INITIAL  3.  Pt will be able to ascend/descend 1 flight of stairs without being limited by R knee pain Baseline: limited Goal status: INITIAL    PLAN:  PT FREQUENCY: 1-2x/week  PT DURATION: 6 weeks  PLANNED INTERVENTIONS: Therapeutic exercises, Therapeutic activity, Neuromuscular re-education,  Balance training, Gait training, Patient/Family education, Self Care, and Joint mobilization  PLAN FOR NEXT SESSION: R knee ROM/strengthening/proprioception, SLR progression, hip strength, SL CKC strength/proprioception  Max Fickle, PT, DPT, OCS  #[id number]  Ardine Bjork,  PT 04/30/2023, 3:34 PM

## 2023-05-05 ENCOUNTER — Ambulatory Visit: Payer: Medicare Other | Attending: Family Medicine

## 2023-05-05 DIAGNOSIS — M25561 Pain in right knee: Secondary | ICD-10-CM | POA: Diagnosis not present

## 2023-05-05 DIAGNOSIS — M1711 Unilateral primary osteoarthritis, right knee: Secondary | ICD-10-CM | POA: Insufficient documentation

## 2023-05-05 DIAGNOSIS — G8929 Other chronic pain: Secondary | ICD-10-CM | POA: Diagnosis not present

## 2023-05-05 NOTE — Therapy (Signed)
OUTPATIENT PHYSICAL THERAPY LOWER EXTREMITY TREATMENT   Patient Name: Marilyn Molina MRN: [id number] DOB:07/15/1951, 72 y.o., female Today's Date: 05/05/2023  END OF SESSION:  PT End of Session - 05/05/23 1554     Visit Number 7    Number of Visits 13    Date for PT Re-Evaluation 04/30/23    Authorization Type Medicare    Authorization Time Period 10 visits    Progress Note Due on Visit 10    PT Start Time 1550    PT Stop Time 1640    PT Time Calculation (min) 50 min    Activity Tolerance Patient tolerated treatment well    Behavior During Therapy [facility] for tasks assessed/performed              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 Yetta Barre, MD  REFERRING PROVIDER: Dr. Ashley Royalty, MD  REFERRING DIAG: Knee osteoarthritis (R LE)  THERAPY DIAG:  Primary osteoarthritis of right knee  Chronic pain of right knee  Rationale for Evaluation and Treatment: Rehabilitation  ONSET DATE: August 2023 From pt evaluation note: SUBJECTIVE:   SUBJECTIVE STATEMENT: Pt c/o R knee pain and difficulty walking.  She noticed onset of sx intermittently for awhile (since Aug 2013).  She started noticing sx daily and went to see Dr. Ashley Royalty for a consult in October 2023.  She started taking an oral anti-inflammatory medication and this was helpful.  She had a f/u with him and her knee was swollen- knee  was drained and had a cortisone shot.  She did well for awhile and then had another injection in March, 2024.  She got some relief from both of the injections.  She was then referred to PT.  Difficulty getting up from the bath tub; difficulty getting up off the floor from sitting or kneeling; going up stairs when it's really sore  Aggravating factor: being up on her legs a lot she feels sore at the end of the day, stair ascent  Alleviating factors: steroid shots, ice, rest, brace (occasionally), biofreeze    PERTINENT HISTORY: No h/o R or L injuries PAIN:  Are you having pain? 0/10 currently; 8-10/10 at worst  PRECAUTIONS: None  WEIGHT BEARING RESTRICTIONS: No  FALLS:  Has patient fallen in last 6 months? No  LIVING ENVIRONMENT: Lives with: lives with their family- has son and grand daughter at home with her; 1 flight of stairs to get upstairs if needed; she mainly stays on first floor.  She has a lot of stairs at her [place] house.     OCCUPATION: retired; she enjoying working with her plants/garden; going to her beach house for the summer; housework/decorating, has 2 dogs and 3 cats, baking too  PLOF: Independent  PATIENT GOALS: Pt's goal is to not have to do injections for knee; to get up from bathtub without assistance when she bathes  the dog  NEXT MD VISIT: none scheduled  OBJECTIVE:   DIAGNOSTIC FINDINGS: x-ray (per chart review): R knee OA  PATIENT SURVEYS:  FOTO 69/70  COGNITION: Overall cognitive status: Within functional limits for tasks assessed     SENSATION: WFL  EDEMA:  Circumferential: no significant difference between R and L knee, small palpable area of swelling at R anterior/lateral knee jt line  MUSCLE LENGTH: Hamstring: 90/90 normal R and L  POSTURE:  R knee valgus >L  PALPATION: Decreased R quad m activation noted with quad set  LOWER EXTREMITY ROM:  Active ROM Right eval Left eval  Hip flexion    Hip extension    Hip abduction     Hip adduction    Hip internal rotation    Hip external rotation    Knee flexion 147 153  Knee extension 0 0  Ankle dorsiflexion    Ankle plantarflexion    Ankle inversion    Ankle eversion     (Blank rows = not tested)  LOWER EXTREMITY MMT:  MMT Right eval Left eval  Hip flexion 5 5  Hip extension    Hip abduction 4 4  Hip adduction    Hip internal rotation    Hip external rotation    Knee flexion 5 5  Knee extension 4+ 4+  Ankle dorsiflexion    Ankle plantarflexion    Ankle inversion    Ankle eversion     (Blank rows = not tested)   FUNCTIONAL TESTS:  Pt able to squat to reach floor, weight shifts off R LE though SLS R x 5-7 sec with increased sway, L 5-7 sec too  GAIT: No antalgic gait noted today; R knee valgus noted compared to L   TODAY'S TREATMENT:                                                                                                                              DATE: 05/05/23  Subjective: Pt reports her knee is still a little sore, swollen.  Feels no worse than last week.  She wore her knee brace while driving this past weekend because there was a lot of traffic and it was stop and go and bothered her knee.  Had some difficulty getting up/down from a beach chair, but overall she managed while she was away.    Pain 4-5/10 currently  Pt amb with slight antalgic gait on R LE upon arrival (+) limited end range flexion AROM due to pain/stiffness (+) swelling along R suprapatellar region and medial knee  Manual Therapy: (seated position) PA mob: Tib-femoral jt A/P, P/A gr 1/2 30 seconds x3 ea, manual distraction 20 seconds x 10  Therapeutic Exercises:  SLR with quad set: x20 2# S/L SLR: 2x12, 2# ankle weight Standing R hip abd; 3x12, 5# ankle weight Standing R hamstring curl: 2x12, 5# ankle weight Seated R knee extension: 2x12, 5# ankle weight Heel raises: 2x15, b/l SLS on airex:  5x5-10 seconds R and L Mini squat: focused on hip hinge 2x10 (high  table), 2x8 with 7# weight- not today Total gym: 2x15 double leg  Front step up, eccentric lower for quadriceps emphasis: 2x12, tactile/verbal cus to achieve full TKE via quad activation (6 inch step, b/l finger tip support)- not today Bridges: 3 second hold, x12, x12 with pillow adduction- not today Standing R hip extension: 2x12, 5# ankle weight    Pt ed for activity pacing, dividing household/kitchen tasks that require prolonged standing into shorter bouts with intermittent seated breaks (on high stool) to control pain/swelling in knee   Cold pack at end of session x 10 min with leg elevated for pain/swelling control   PATIENT EDUCATION:  Education details: PT POC/goals, HEP, cryotherapy for pain/swelling as needed Person educated: Patient Education method: Explanation and Handouts Education comprehension: verbalized understanding  HOME EXERCISE PROGRAM: Access Code: [email or web address] URL: [email or web address] Date: 03/26/2023 Prepared by: Max Fickle  Exercises - Supine Quad Set  - 2 x daily - 7 x weekly - 2 sets - 10 reps - 5 hold - Active Straight Leg Raise with Quad Set  - 1-2 x daily - 7 x weekly - 2 sets - 10 reps - 5 hold - Sidelying Hip Abduction  - 1 x daily - 7 x weekly - 2 sets - 10 reps - Heel Raises with Counter Support  - 1 x daily - 7 x weekly - 2 sets - 10 reps - Mini Squat with Chair  - 1 x daily - 7 x weekly - 2 sets - 10 reps  ASSESSMENT:  CLINICAL IMPRESSION:  Continued with manual therapy for pain control, LE strengthening, and cryotherapy for pain and swelling management at end of session.  Pt tolerated leg press on total gym in gravity reduced position better than standing squats today.  Discussed strategies to continue managing swelling at home with cryotherapy, and for benefits of activity pacing/modifications and using her brace as needed for prolonged standing activities at home.  Overall, she should continue to benefit from skilled PT to  address ROM/strength impairments and to facilitate improved ability to perform her daily activities without being limited by her R knee pain   OBJECTIVE IMPAIRMENTS: Abnormal gait, decreased balance, decreased ROM, decreased strength, and pain.   ACTIVITY LIMITATIONS: lifting, squatting, stairs, transfers, and getting dog out of bathtub  PARTICIPATION LIMITATIONS: meal prep, cleaning, community activity, and yard work  PERSONAL FACTORS: Past/current experiences and Time since onset of injury/illness/exacerbation are also affecting patient's functional outcome.   REHAB POTENTIAL: Excellent  CLINICAL DECISION MAKING: Stable/uncomplicated  EVALUATION COMPLEXITY: Low   GOALS: Goals reviewed with patient? Yes  SHORT TERM GOALS: Target date: 04/01/23 Pt will demonstrate ability to perform quad set and SLR for quadriceps retraining with proper technique as her HEP Baseline: initiated HEP today at evaluation Goal status: INITIAL     LONG TERM GOALS: Target date: 04/30/23  Improve R LE strength 1/2 MMT grade to facilitate improved ability to transfer from sitting in bathtub or floor to standing independently without being limited by her R knee pain/weakness Baseline: asks her son to help Goal status: INITIAL  2.  Pt will be able to perform HEP independently for R knee ROM, strength, proprioception to independently manage R knee OA sx Baseline: not performing any HEP currently Goal status: INITIAL  3.  Pt will be able to ascend/descend 1 flight of stairs without being limited by R knee pain Baseline: limited Goal status: INITIAL  PLAN:  PT FREQUENCY: 1-2x/week  PT DURATION: 6 weeks  PLANNED INTERVENTIONS: Therapeutic exercises, Therapeutic activity, Neuromuscular re-education, Balance training, Gait training, Patient/Family education, Self Care, and Joint mobilization  PLAN FOR NEXT SESSION: R knee ROM/strengthening/proprioception, SLR progression, hip strength, SL CKC  strength/proprioception  Max Fickle, PT, DPT, OCS  #[id number]  Ardine Bjork, PT 05/05/2023, 5:27 PM

## 2023-05-07 ENCOUNTER — Ambulatory Visit: Payer: Medicare Other

## 2023-05-07 DIAGNOSIS — M25561 Pain in right knee: Secondary | ICD-10-CM | POA: Diagnosis not present

## 2023-05-07 DIAGNOSIS — M1711 Unilateral primary osteoarthritis, right knee: Secondary | ICD-10-CM

## 2023-05-07 DIAGNOSIS — G8929 Other chronic pain: Secondary | ICD-10-CM | POA: Diagnosis not present

## 2023-05-07 NOTE — Therapy (Signed)
OUTPATIENT PHYSICAL THERAPY LOWER EXTREMITY TREATMENT [facility]   Patient Name: Marilyn Molina MRN: [id number] DOB:05-01-1951, 72 y.o., female Today's Date: 05/07/2023  END OF SESSION:  PT End of Session - 05/07/23 0956     Visit Number 8    Number of Visits 13    Date for PT Re-Evaluation 04/30/23    Authorization Type Medicare    Authorization Time Period 10 visits    Progress Note Due on Visit 10    PT Start Time 0950    PT Stop Time 1030    PT Time Calculation (min) 40 min    Activity Tolerance Patient tolerated treatment well    Behavior During Therapy [facility] for tasks assessed/performed              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 Yetta Barre, MD  REFERRING PROVIDER: Dr. Ashley Royalty, MD  REFERRING DIAG: Knee osteoarthritis (R LE)  THERAPY DIAG:  Primary osteoarthritis of right knee  Chronic pain of right knee  Rationale for Evaluation and Treatment: Rehabilitation  ONSET DATE: August 2023 From pt evaluation note: SUBJECTIVE:   SUBJECTIVE STATEMENT: Pt c/o R knee pain and difficulty walking.  She noticed onset of sx intermittently for awhile (since Aug 2013).  She started noticing sx daily and went to see Dr. Ashley Royalty for a consult in October 2023.  She started taking an oral anti-inflammatory medication and this was helpful.  She had a f/u with him and her knee  was swollen- knee was drained and had a cortisone shot.  She did well for awhile and then had another injection in March, 2024.  She got some relief from both of the injections.  She was then referred to PT.  Difficulty getting up from the bath tub; difficulty getting up off the floor from sitting or kneeling; going up stairs when it's really sore  Aggravating factor: being up on her legs a lot she feels sore at the end of the day, stair ascent  Alleviating factors: steroid shots, ice, rest, brace (occasionally), biofreeze    PERTINENT HISTORY: No h/o R or L injuries PAIN:  Are you having pain? 0/10 currently; 8-10/10 at worst  PRECAUTIONS: None  WEIGHT BEARING RESTRICTIONS: No  FALLS:  Has patient fallen in last 6 months? No  LIVING ENVIRONMENT: Lives with: lives with their family- has son and grand daughter at home with her; 1 flight of stairs to get upstairs if needed; she mainly stays on first floor.  She has a lot of stairs at her [place] house.     OCCUPATION: retired; she enjoying working with her plants/garden; going to her beach house for the summer; housework/decorating, has 2 dogs and 3 cats, baking too  PLOF: Independent  PATIENT GOALS: Pt's goal is to not have to do injections for knee; to get up from bathtub without assistance when  she bathes the dog  NEXT MD VISIT: none scheduled  OBJECTIVE:   DIAGNOSTIC FINDINGS: x-ray (per chart review): R knee OA  PATIENT SURVEYS:  FOTO 69/70  COGNITION: Overall cognitive status: Within functional limits for tasks assessed     SENSATION: WFL  EDEMA:  Circumferential: no significant difference between R and L knee, small palpable area of swelling at R anterior/lateral knee jt line  MUSCLE LENGTH: Hamstring: 90/90 normal R and L  POSTURE:  R knee valgus >L  PALPATION: Decreased R quad m activation noted with quad set  LOWER EXTREMITY ROM:  Active ROM Right eval Left eval  Hip flexion    Hip extension     Hip abduction    Hip adduction    Hip internal rotation    Hip external rotation    Knee flexion 147 153  Knee extension 0 0  Ankle dorsiflexion    Ankle plantarflexion    Ankle inversion    Ankle eversion     (Blank rows = not tested)  LOWER EXTREMITY MMT:  MMT Right eval Left eval  Hip flexion 5 5  Hip extension    Hip abduction 4 4  Hip adduction    Hip internal rotation    Hip external rotation    Knee flexion 5 5  Knee extension 4+ 4+  Ankle dorsiflexion    Ankle plantarflexion    Ankle inversion    Ankle eversion     (Blank rows = not tested)   FUNCTIONAL TESTS:  Pt able to squat to reach floor, weight shifts off R LE though SLS R x 5-7 sec with increased sway, L 5-7 sec too  GAIT: No antalgic gait noted today; R knee valgus noted compared to L   TODAY'S TREATMENT:                                                                                                                              DATE: 05/07/23  Subjective: Pt reports she goes to the beach tomorrow; she stays there for most of the summer and returns home in August.  She plans to continue working on her HEP, walking, and staying active while she is at [employer].    Pain 4-5/10 currently  Pt amb with slight antalgic gait on R LE upon arrival (+) limited end range flexion AROM due to pain/stiffness (+) swelling along R suprapatellar region and medial knee  R knee AROM: 0-140 degrees, PROM 0-145 degrees Strength: R LE hip abd 4+/5, knee extension 4+/5  Assessed stair navigation: able to ascend/descend with reciprocal gait pattern; pt notes some discomfort with descent off R LE.    Manual Therapy: (seated position) PA mob: Tib-femoral jt A/P, P/A gr 1/2 30 seconds x3 ea, manual distraction 20 seconds x 10  Therapeutic Exercises:  Reviewed HEP, updated HEP and discussed rationale for each exercise; discussed how to continue with them while she lives at the beach for the  summer.    SLR with quad  set: x20 2# S/L SLR: 2x12, 2# ankle weight Seated R knee extension: 2x12, 5# ankle weight Heel raises: 2x15, b/l SLS on airex: 5x5-10 seconds R and L  Pt ed for cryotherapy, activity pacing, dividing household/kitchen tasks that require prolonged standing into shorter bouts with intermittent seated breaks (on high stool) to control pain/swelling in knee; also discussed following back up with Dr. Ashley Royalty when she returns from the beach if her pain/swelling worsens.  Pt states she will use cold pack at end of the day once she returns home again.   PATIENT EDUCATION:  Education details: PT POC/goals, HEP, cryotherapy for pain/swelling as needed Person educated: Patient Education method: Explanation and Handouts Education comprehension: verbalized understanding  HOME EXERCISE PROGRAM: Access Code: [email or web address] URL: [email or web address] Date: 05/07/2023 Prepared by: Max Fickle  Exercises - Active Straight Leg Raise with Quad Set  - 1-2 x daily - 7 x weekly - 3 sets - 20 reps - 5 hold - Sidelying Hip Abduction  - 1 x daily - 5 x weekly - 3 sets - 10-15 reps - Heel Raises with Counter Support  - 1 x daily - 5 x weekly - 3 sets - 20 reps - Mini Squat with Chair  - 1 x daily - 5 x weekly - 3 sets - 10 reps - Single Leg Stance  - 1 x daily - 5 x weekly - 4 reps - 20 hold - Supine Heel Slide with Strap  - 1 x daily - 7 x weekly - 10 reps - 5 hold  ASSESSMENT:  CLINICAL IMPRESSION:  Pt has attended 8 outpatient physical therapy sessions.  She was very motivated to participate in PT and has been instructed on a HEP which she can continue working on while she lives at [employer] for the summer.  She met or has made progress towards PT goals.  She does continue to have mild swelling and reports R knee pain (4-5/10) associated with her knee OA.  Pt has been educated on strategies for activity pacing, self care techniques for pain/swelling, and the benefit of exercise (ROM, strength,  proprioception focus) for knee OA.  Plan to DC this course of PT for now.  If she needs to return at the end of the summer or if her condition worsens I would be happy to work with her again in the future.  OBJECTIVE IMPAIRMENTS: Abnormal gait, decreased balance, decreased ROM, decreased strength, and pain.   ACTIVITY LIMITATIONS: lifting, squatting, stairs, transfers, and getting dog out of bathtub  PARTICIPATION LIMITATIONS: meal prep, cleaning, community activity, and yard work  PERSONAL FACTORS: Past/current experiences and Time since onset of injury/illness/exacerbation are also affecting patient's functional outcome.   REHAB POTENTIAL: Excellent  CLINICAL DECISION MAKING: Stable/uncomplicated  EVALUATION COMPLEXITY: Low   GOALS: Goals reviewed with patient? Yes  SHORT TERM GOALS: Target date: 04/01/23 Pt will demonstrate ability to perform quad set and SLR for quadriceps retraining with proper technique as her HEP Baseline: initiated HEP today at evaluation; 05/07/23- met Goal status: MET   LONG TERM GOALS: Target date: 04/30/23  Improve R LE strength 1/2 MMT grade to facilitate improved ability to transfer from sitting in bathtub or floor to standing independently without being limited by her R knee pain/weakness Baseline: asks her son to help; 05/07/23 in progress, hip abd has improved 1/2 MMT grade Goal status: IN PROGRESS  2.  Pt will be able to perform HEP independently for R knee  ROM, strength, proprioception to independently manage R knee OA sx Baseline: not performing any HEP currently; 05/07/23 pt has met, has HEP to address ROM/strength/proprioception and able to perform with good form Goal status: MET  3.  Pt will be able to ascend/descend 1 flight of stairs without being limited by R knee pain Baseline: limited; 05/07/23 pt notes 4/10 pain with descending off R LE Goal status: IN PROGRESS   PLAN:  PT FREQUENCY: 1-2x/week  PT DURATION: 6 weeks  PLANNED  INTERVENTIONS: Therapeutic exercises, Therapeutic activity, Neuromuscular re-education, Balance training, Gait training, Patient/Family education, Self Care, and Joint mobilization  PLAN FOR NEXT SESSION: plan to transition to independent HEP at this time as she is going to live at the beach for the summer.  DC this course of PT.  Max Fickle, PT, DPT, OCS  #[id number]  Ardine Bjork, PT 05/07/2023, 11:07 AM

## 2023-05-14 ENCOUNTER — Ambulatory Visit: Payer: Medicare Other | Admitting: Family Medicine

## 2023-05-15 ENCOUNTER — Ambulatory Visit: Payer: Medicare Other | Admitting: Family Medicine

## 2023-05-15 DIAGNOSIS — J209 Acute bronchitis, unspecified: Secondary | ICD-10-CM | POA: Diagnosis not present

## 2023-05-15 DIAGNOSIS — J0100 Acute maxillary sinusitis, unspecified: Secondary | ICD-10-CM | POA: Diagnosis not present

## 2023-06-12 ENCOUNTER — Telehealth (INDEPENDENT_AMBULATORY_CARE_PROVIDER_SITE_OTHER): Payer: Medicare Other | Admitting: Family Medicine

## 2023-06-12 ENCOUNTER — Ambulatory Visit: Payer: Self-pay

## 2023-06-12 ENCOUNTER — Encounter: Payer: Self-pay | Admitting: Family Medicine

## 2023-06-12 DIAGNOSIS — M1711 Unilateral primary osteoarthritis, right knee: Secondary | ICD-10-CM | POA: Diagnosis not present

## 2023-06-12 MED ORDER — DICLOFENAC SODIUM 50 MG PO TBEC: 50.0000 mg | DELAYED_RELEASE_TABLET | Freq: Two times a day (BID) | ORAL | 1 refills | Status: DC | PRN

## 2023-06-12 MED ORDER — TRAMADOL HCL 50 MG PO TABS
50.0000 mg | ORAL_TABLET | Freq: Three times a day (TID) | ORAL | 0 refills | Status: AC | PRN
Start: 2023-06-12 — End: 2023-06-17

## 2023-06-12 NOTE — Telephone Encounter (Signed)
Pt states that she is out of town and is still having right knee pain. Pt states that she has been taking left over anti inflammatory pill and is wanting to see if some can be called into the pharmacy for her. Pt states that she does not remember the name but it is a red anti inflammatory pill.     [facility] [address], [place] - [address]  Phone: [phone]  Fax: [phone]    Left message to call back about symptoms.

## 2023-06-12 NOTE — Assessment & Plan Note (Signed)
Chronic, had demonstrated excellent response to Zilretta injection roughly 4 months prior, had been performing PT as well. Just recently noted swelling and pain in the setting of increased activity.   Plan as follows: - Coordinate viscosupplementation  - Interim use of diclofenac BID (new Rx) - PRN sporadic use of tramadol for recalcitrant symptom

## 2023-06-12 NOTE — Progress Notes (Signed)
Primary Care / Sports Medicine Virtual Visit  Patient Information:  Patient ID: Marilyn Molina, female DOB: 05/29/1951 Age: 72 y.o. MRN: [id number]   Marilyn Molina Marilyn Molina is a pleasant 72 y.o. female presenting with the following:  Chief Complaint  Patient presents with   Primary osteoarthritis of right knee    Review of Systems: No fevers, chills, night sweats, weight loss, chest pain, or shortness of breath.   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   ANORO ELLIPTA 62.5-25 MCG/ACT AEPB TAKE 1 PUFF BY MOUTH EVERY DAY   atorvastatin (LIPITOR) 10 MG tablet Take 1 tablet (10 mg total) by mouth daily.   Calcium Carbonate-Vit D-Min (CALCIUM 1200 PO) Take 1 tablet by mouth daily.   carvedilol (COREG) 3.125 MG tablet Take 1 tablet (3.125 mg total) by mouth 2 (two) times daily with a meal. (Patient taking differently: Take 3.125 mg by mouth 2 (two) times daily with a meal. 2 pills BID/ cardio at [facility])   cholecalciferol (VITAMIN D3) 25 MCG (1000 UNIT) tablet Take 1,000 Units by mouth daily.   diclofenac (VOLTAREN) 50 MG EC tablet Take 1 tablet (50 mg total) by mouth 2 (two) times daily as needed.   ENTRESTO 24-26 MG Take 1 tablet by mouth 2 (two) times daily. [facility]   montelukast (SINGULAIR) 10 MG tablet Take 1  tablet (10 mg total) by mouth daily.   Multiple Vitamin (MULTI-VITAMIN) tablet Take 1 tablet by mouth daily.   promethazine-dextromethorphan (PROMETHAZINE-DM) 6.25-15 MG/5ML syrup Take 5 mLs by mouth 4 (four) times daily as needed.   sertraline (ZOLOFT) 100 MG tablet Take 1 tablet (100 mg total) by mouth daily.   traMADol (ULTRAM) 50 MG tablet Take 1 tablet (50 mg total) by mouth every 8 (eight) hours as needed for up to 5 days.   vitamin B-12 (CYANOCOBALAMIN) 1000 MCG tablet Take 1,000 mcg by mouth daily.   [DISCONTINUED] celecoxib (CELEBREX) 100 MG capsule TAKE 1 CAPSULE BY MOUTH TWICE A DAY   No facility-administered encounter medications on file as of 06/12/2023.   Past Surgical History:  Procedure Laterality Date   OVARIAN CYST REMOVAL      Virtual Visit via MyChart Video:   I connected with Marilyn Molina on 06/12/23 via MyChart Video and verified that I am speaking with the correct person using appropriate identifiers.   The limitations, risks, security and privacy concerns of performing an evaluation and management service by MyChart Video, including the higher likelihood of inaccurate diagnoses and treatments, and the availability of in person appointments were reviewed. The possible need of an additional face-to-face encounter for complete and high quality delivery of care was discussed. The patient was also made aware that there may be a patient responsible charge related to this service. The patient expressed understanding and  wishes to proceed.  Provider location is in medical facility. Patient location is at their home, different from provider location. People involved in care of the patient during this telehealth encounter were myself, my nurse/medical assistant, and my front office/scheduling team member.  Objective findings:   General: Speaking full sentences, no audible heavy breathing. Sounds alert and appropriately interactive. Well-appearing. Face symmetric.  Extraocular movements intact. Pupils equal and round. No nasal flaring or accessory muscle use visualized.  Independent interpretation of notes and tests performed by another provider:   None  Pertinent History, Exam, Impression, and Recommendations:   Primary osteoarthritis of right knee Chronic, had demonstrated excellent response to Zilretta injection roughly 4 months prior, had been performing PT as well. Just recently noted swelling and pain in the setting of increased activity.   Plan as follows: - Coordinate viscosupplementation  - Interim use of diclofenac BID (new Rx) - PRN sporadic use of tramadol for recalcitrant symptom  Orders & Medications Meds ordered this encounter  Medications   diclofenac (VOLTAREN) 50 MG EC tablet    Sig: Take 1 tablet (50 mg total) by mouth 2 (two) times daily as needed.    Dispense:  60 tablet    Refill:  1   traMADol (ULTRAM) 50 MG tablet    Sig: Take 1 tablet (50 mg total) by mouth every 8 (eight) hours as needed for up to 5 days.    Dispense:  15 tablet    Refill:  0   No orders of the defined types were placed in this encounter.    I discussed the above assessment and treatment plan with the patient. The patient was provided an opportunity to ask questions and all were answered. The patient agreed with the plan and demonstrated an understanding of the instructions.   The patient was advised to call back or seek an in-person evaluation if the symptoms worsen or if the condition fails to improve as anticipated.   I provided a total time of 30 minutes including both face-to-face and non-face-to-face time on 06/12/2023 inclusive of time utilized for medical chart review, information gathering, care coordination with staff, and documentation completion.    Marilyn Banana, MD, [facility]   Primary Care Sports Medicine Primary Care and Sports Medicine at [facility]

## 2023-06-12 NOTE — Telephone Encounter (Signed)
Chief Complaint: Right knee pain Symptoms: 8/10 sharp pain, a little swollen Frequency: ongoing x 6 weeks Pertinent Negatives: Patient denies other symptoms Disposition: [] ED /[] Urgent Care (no appt availability in office) / [x] Appointment(In office/virtual)/ []  [facility]/ [] Home Care/ [] Refused Recommended Disposition /[]  Mobile Bus/ []  Follow-up with PCP Additional Notes: Patient says the pain is not new. She's been doing the PT exercises and wearing the brace. She says she was using ibuprofen, but it's not helping. Patient is asking for a refill on a "red anti-inflammatory pill" that Dr. Ashley Royalty prescribed. Advised a MyChart video visit today, she agreed, scheduled.   Summary: right knee pain   Pt states that she is out of town and is still having right knee pain. Pt states that she has been taking left over anti inflammatory pill and is wanting to see if some can be called into the pharmacy for her. Pt states that she does not remember the name but it is a red anti inflammatory pill.    [id number] - [place], [place] - [phone] Phone: [phone] Fax: [phone]     Reason for Disposition  [1] SEVERE pain (e.g., excruciating, unable to walk) AND [2] not improved after 2 hours of pain medicine  Answer Assessment - Initial Assessment Questions 1. LOCATION and RADIATION: "Where is the pain located?"      Right knee 2. QUALITY: "What does the pain feel like?"  (e.g., sharp, dull, aching, burning)     Sharp ache 3. SEVERITY: "How bad is the pain?" "What does it keep you from doing?"   (Scale 1-10; or mild, moderate, severe)   -  MILD (1-3): doesn't interfere with normal activities    -  MODERATE (4-7): interferes with normal activities (e.g., work or school) or awakens from sleep, limping    -  SEVERE (8-10): excruciating pain, unable to do any normal activities, unable to walk     8 4. ONSET: "When did the pain start?" "Does it come and  go, or is it there all the time?"     Past 6 weeks, constant 5. RECURRENT: "Have you had this pain before?" If Yes, ask: "When, and what happened then?"     Yes, saw Dr. Ashley Royalty for this. Taking PT, taking anti-imflammatory 6. SETTING: "Has there been any recent work, exercise or other activity that involved that part of the body?"       No 7. AGGRAVATING FACTORS: "What makes the knee pain worse?" (e.g., walking, climbing stairs, running)     On feet a lot makes it hurt. 8. ASSOCIATED SYMPTOMS: "Is there any swelling or redness of the knee?"     Swelling right at the knee 9. OTHER SYMPTOMS: "Do you have any other symptoms?" (e.g., chest pain, difficulty breathing, fever, calf pain)     No  Protocols used: Knee Pain-A-AH

## 2023-06-19 ENCOUNTER — Encounter: Payer: Self-pay | Admitting: Family Medicine

## 2023-06-19 ENCOUNTER — Other Ambulatory Visit (INDEPENDENT_AMBULATORY_CARE_PROVIDER_SITE_OTHER): Payer: Medicare Other | Admitting: Radiology

## 2023-06-19 ENCOUNTER — Ambulatory Visit (INDEPENDENT_AMBULATORY_CARE_PROVIDER_SITE_OTHER): Payer: Medicare Other | Admitting: Family Medicine

## 2023-06-19 VITALS — BP 112/78 | HR 85

## 2023-06-19 DIAGNOSIS — M1711 Unilateral primary osteoarthritis, right knee: Secondary | ICD-10-CM

## 2023-06-19 MED ORDER — HYALURONAN 88 MG/4ML IX SOSY
88.0000 mg | PREFILLED_SYRINGE | Freq: Once | INTRA_ARTICULAR | Status: AC
Start: 2023-06-19 — End: 2023-06-19
  Administered 2023-06-19: 88 mg via INTRA_ARTICULAR

## 2023-06-19 MED ORDER — TRIAMCINOLONE ACETONIDE 40 MG/ML IJ SUSP
40.0000 mg | Freq: Once | INTRAMUSCULAR | Status: AC
Start: 2023-06-19 — End: 2023-06-19
  Administered 2023-06-19: 40 mg via INTRAMUSCULAR

## 2023-06-19 NOTE — Progress Notes (Signed)
     Primary Care / Sports Medicine Office Visit  Patient Information:  Patient ID: Marilyn Molina, female DOB: 05-15-1951 Age: 72 y.o. MRN: [id number]   Marilyn Molina is a pleasant 72 y.o. female presenting with the following:  Chief Complaint  Patient presents with   Primary osteoarthritis of right knee    Vitals:   06/19/23 1052  BP: 112/78  Pulse: 85  SpO2: 98%   There were no vitals filed for this visit. There is no height or weight on file to calculate BMI.  No results found.   Independent interpretation of notes and tests performed by another provider:   None  Procedures performed:   Procedure:  Injection after aspiration of of right knee under ultrasound guidance. Ultrasound guidance utilized for in-plane approach to the suprapatellar bursa, effusion noted Samsung HS60 device utilized with permanent recording / reporting. Verbal informed consent obtained and verified. Skin prepped in a sterile fashion. Ethyl chloride for topical local analgesia.  Completed without difficulty and tolerated well. Aspirate: 10 mL serosanguineous fluid Medication: triamcinolone acetonide 40 mg/mL suspension for injection 1 mL total and 2 mL lidocaine 1% without epinephrine utilized for needle placement anesthetic, Monovisc 88 mg (4 mL) Advised to contact for fevers/chills, erythema, induration, drainage, or persistent bleeding.   Pertinent History, Exam, Impression, and Recommendations:   Marilyn Molina was seen today for primary osteoarthritis of right knee.  Primary osteoarthritis of right knee Overview: Corticosteroid injections: - 10/01/2022 - 02/04/2023 (Zilretta) - 06/19/2023  Viscosupplementation: - 06/19/2023 (Monovisc)  Assessment & Plan: Patient presents to follow-up for chronic right knee pain in the setting of osteoarthritis.  Of note, last seen via video visit and advised diclofenac and tramadol, continues to be symptomatic despite that.  Examination  significant for 1-2+ effusion, diffuse tenderness with range of motion limited by pain.  After a review of prior treatments, additional management strategies discussed, plan as follows:  - Patient elected proceed with both corticosteroid injection as well as Monovisc after the knee was aspirated - Post care reviewed, can transition to as needed diclofenac and reserve tramadol for severe breakthrough pain - Can follow-up as needed, we can repeat viscosupplementation in 6 months if indicated - She can follow-up as needed  Orders: -     [name]; Future -     Hyaluronan -     Triamcinolone Acetonide     Orders & Medications Meds ordered this encounter  Medications   Hyaluronan (MONOVISC) intra-articular injection 88 mg   triamcinolone acetonide (KENALOG-40) injection 40 mg   Orders Placed This Encounter  Procedures   [name]     Return if symptoms worsen or fail to improve.     Jerrol Banana, MD, [facility]   Primary Care Sports Medicine Primary Care and Sports Medicine at [facility]

## 2023-06-19 NOTE — Assessment & Plan Note (Signed)
Patient presents to follow-up for chronic right knee pain in the setting of osteoarthritis.  Of note, last seen via video visit and advised diclofenac and tramadol, continues to be symptomatic despite that.  Examination significant for 1-2+ effusion, diffuse tenderness with range of motion limited by pain.  After a review of prior treatments, additional management strategies discussed, plan as follows:  - Patient elected proceed with both corticosteroid injection as well as Monovisc after the knee was aspirated - Post care reviewed, can transition to as needed diclofenac and reserve tramadol for severe breakthrough pain - Can follow-up as needed, we can repeat viscosupplementation in 6 months if indicated - She can follow-up as needed

## 2023-06-19 NOTE — Patient Instructions (Addendum)
You have just been given a gel lubricant injection to reduce knee pain. You can resume normal activities but avoid anything strenuous that puts excess strain on the joint for 48 hours. Avoid activities such as jogging, soccer, tennis, heavy lifting, or standing on your feet for a long time. If you do have pain, simply rest the joint and use ice. If you can tolerate over the counter medications, you can try Tylenol, Aleve, or Advil for added relief per package instructions. - The gel can be repeated in 6 months, contact our office to schedule or for any questions - Can use diclofenac twice daily as-needed (take with food) - Can use tramadol as-needed for breakthrough pain not responding to the above

## 2023-07-02 DIAGNOSIS — I429 Cardiomyopathy, unspecified: Secondary | ICD-10-CM | POA: Diagnosis not present

## 2023-07-02 DIAGNOSIS — I110 Hypertensive heart disease with heart failure: Secondary | ICD-10-CM | POA: Diagnosis not present

## 2023-07-02 DIAGNOSIS — I400 Infective myocarditis: Secondary | ICD-10-CM | POA: Diagnosis not present

## 2023-07-02 DIAGNOSIS — F17200 Nicotine dependence, unspecified, uncomplicated: Secondary | ICD-10-CM | POA: Diagnosis not present

## 2023-07-02 DIAGNOSIS — R42 Dizziness and giddiness: Secondary | ICD-10-CM | POA: Diagnosis not present

## 2023-07-02 DIAGNOSIS — I428 Other cardiomyopathies: Secondary | ICD-10-CM | POA: Diagnosis not present

## 2023-07-02 DIAGNOSIS — T148XXA Other injury of unspecified body region, initial encounter: Secondary | ICD-10-CM | POA: Diagnosis not present

## 2023-07-02 DIAGNOSIS — X58XXXA Exposure to other specified factors, initial encounter: Secondary | ICD-10-CM | POA: Diagnosis not present

## 2023-07-02 DIAGNOSIS — I509 Heart failure, unspecified: Secondary | ICD-10-CM | POA: Diagnosis not present

## 2023-07-02 DIAGNOSIS — E782 Mixed hyperlipidemia: Secondary | ICD-10-CM | POA: Diagnosis not present

## 2023-07-02 DIAGNOSIS — D72829 Elevated white blood cell count, unspecified: Secondary | ICD-10-CM | POA: Diagnosis not present

## 2023-07-07 ENCOUNTER — Encounter: Payer: Self-pay | Admitting: Family Medicine

## 2023-07-07 ENCOUNTER — Ambulatory Visit (INDEPENDENT_AMBULATORY_CARE_PROVIDER_SITE_OTHER): Payer: Medicare Other | Admitting: Family Medicine

## 2023-07-07 VITALS — BP 120/70 | HR 64 | Ht 66.0 in | Wt 117.0 lb

## 2023-07-07 DIAGNOSIS — Z1211 Encounter for screening for malignant neoplasm of colon: Secondary | ICD-10-CM | POA: Diagnosis not present

## 2023-07-07 DIAGNOSIS — Z1239 Encounter for other screening for malignant neoplasm of breast: Secondary | ICD-10-CM

## 2023-07-07 DIAGNOSIS — R634 Abnormal weight loss: Secondary | ICD-10-CM | POA: Diagnosis not present

## 2023-07-07 DIAGNOSIS — F324 Major depressive disorder, single episode, in partial remission: Secondary | ICD-10-CM | POA: Diagnosis not present

## 2023-07-07 DIAGNOSIS — J329 Chronic sinusitis, unspecified: Secondary | ICD-10-CM | POA: Diagnosis not present

## 2023-07-07 DIAGNOSIS — J4521 Mild intermittent asthma with (acute) exacerbation: Secondary | ICD-10-CM

## 2023-07-07 MED ORDER — DOXYCYCLINE HYCLATE 100 MG PO TABS: 100.0000 mg | ORAL_TABLET | Freq: Two times a day (BID) | ORAL | 0 refills | Status: DC

## 2023-07-07 MED ORDER — MONTELUKAST SODIUM 10 MG PO TABS
10.0000 mg | ORAL_TABLET | Freq: Every day | ORAL | 1 refills | Status: DC
Start: 2023-07-07 — End: 2024-01-02

## 2023-07-07 MED ORDER — SERTRALINE HCL 100 MG PO TABS
100.0000 mg | ORAL_TABLET | Freq: Every day | ORAL | 1 refills | Status: DC
Start: 2023-07-07 — End: 2024-01-08

## 2023-07-07 NOTE — Progress Notes (Signed)
Date:  07/07/2023   Name:  Marilyn Molina   DOB:  1951-10-20   MRN:  [id number]   Chief Complaint: breast exam, Allergic Rhinitis , Depression, and Colon Cancer Screening  Depression        This is a chronic problem.  The current episode started more than 1 year ago.   The problem has been gradually improving since onset.  Associated symptoms include no decreased concentration, no fatigue, no helplessness, no hopelessness, does not have insomnia, not irritable, no restlessness, no decreased interest, no appetite change, no body aches, no myalgias, no headaches, no indigestion, not sad and no suicidal ideas.  Past treatments include SSRIs - Selective serotonin reuptake inhibitors.  Compliance with treatment is good.  Previous treatment provided moderate relief. Sinusitis This is a chronic problem. The current episode started more than 1 year ago. The problem has been gradually improving since onset. There has been no fever. Associated symptoms include sinus pressure. Pertinent negatives include no chills, congestion, coughing, diaphoresis, ear pain, headaches, shortness of breath, sneezing, sore throat or swollen glands. (Prod nasal discharge)    Lab Results  Component Value Date   NA 143 01/07/2022   K 5.0 01/07/2022   CO2 22 01/07/2022   GLUCOSE 93 01/07/2022   BUN 13 01/07/2022   CREATININE 0.89 01/07/2022   CALCIUM 9.5 01/07/2022   EGFR 70 01/07/2022   GFRNONAA 84 01/10/2021   Lab Results  Component Value Date   CHOL 152 01/07/2022   HDL 69 01/07/2022   LDLCALC 67 01/07/2022   TRIG 83 01/07/2022   CHOLHDL 2.9 10/08/2018   Lab Results  Component Value Date   TSH 1.430 01/10/2021   No results found for: "HGBA1C" Lab Results  Component Value Date   WBC 10.9 (H) 01/10/2021   HGB 13.7 01/10/2021   HCT 40.5 01/10/2021   MCV 96 01/10/2021   PLT 389 01/10/2021   Lab Results  Component Value Date   ALT 11 01/07/2022   AST 15 01/07/2022   ALKPHOS 87 01/07/2022    BILITOT <0.2 01/07/2022   No results found for: "25OHVITD2", "25OHVITD3", "VD25OH"   Review of Systems  Constitutional:  Negative for appetite change, chills, diaphoresis, fatigue and fever.  HENT:  Positive for sinus pressure. Negative for congestion, ear pain, rhinorrhea, sneezing and sore throat.   Eyes:  Negative for redness, itching and visual disturbance.  Respiratory:  Negative for cough, chest tightness, shortness of breath and wheezing.   Cardiovascular:  Negative for chest pain, palpitations and leg swelling.  Gastrointestinal:  Negative for abdominal distention, blood in stool and diarrhea.  Endocrine: Negative for polydipsia and polyuria.  Genitourinary:  Negative for flank pain, hematuria and vaginal bleeding.  Musculoskeletal:  Negative for myalgias.  Neurological:  Negative for headaches.  Hematological:  Negative for adenopathy. Bruises/bleeds easily.  Psychiatric/Behavioral:  Positive for depression. Negative for decreased concentration and suicidal ideas. The patient does not have insomnia.     Patient Active Problem List   Diagnosis Date Noted   Primary osteoarthritis of right knee 09/10/2022   Centrilobular emphysema (HCC) 04/11/2022   Aortic atherosclerosis (HCC) 04/11/2022   Anxiety 11/21/2015   Essential hypertension 11/21/2015   Anxiety and depression 11/03/2012   Cardiomyopathy (HCC) 10/29/2011   Cigarette nicotine dependence without complication 10/29/2011   Tobacco abuse 10/29/2011    No Known Allergies  Past Surgical History:  Procedure Laterality Date   OVARIAN CYST REMOVAL      Social History  Tobacco Use   Smoking status: Every Day    Current packs/day: 1.00    Average packs/day: 1 pack/day for 51.0 years (51.0 ttl pk-yrs)    Types: Cigarettes   Smokeless tobacco: Never   Tobacco comments:    0.5 PPD 09/04/2022  Vaping Use   Vaping status: Never Used  Substance Use Topics   Alcohol use: No   Drug use: No     Medication list has  been reviewed and updated.  Current Meds  Medication Sig   ANORO ELLIPTA 62.5-25 MCG/ACT AEPB TAKE 1 PUFF BY MOUTH EVERY DAY   atorvastatin (LIPITOR) 10 MG tablet Take 1 tablet (10 mg total) by mouth daily.   Calcium Carbonate-Vit D-Min (CALCIUM 1200 PO) Take 1 tablet by mouth daily.   carvedilol (COREG) 3.125 MG tablet Take 1 tablet (3.125 mg total) by mouth 2 (two) times daily with a meal. (Patient taking differently: Take 3.125 mg by mouth 2 (two) times daily with a meal. 2 pills BID/ cardio at [facility])   cholecalciferol (VITAMIN D3) 25 MCG (1000 UNIT) tablet Take 1,000 Units by mouth daily.   diclofenac (VOLTAREN) 50 MG EC tablet Take 1 tablet (50 mg total) by mouth 2 (two) times daily as needed.   ENTRESTO 24-26 MG Take 1 tablet by mouth 2 (two) times daily. [facility]   montelukast (SINGULAIR) 10 MG tablet Take 1 tablet (10 mg total) by mouth daily.   Multiple Vitamin (MULTI-VITAMIN) tablet Take 1 tablet by mouth daily.   sertraline (ZOLOFT) 100 MG tablet Take 1 tablet (100 mg total) by mouth daily.   vitamin B-12 (CYANOCOBALAMIN) 1000 MCG tablet Take 1,000 mcg by mouth daily.   [DISCONTINUED] amoxicillin-clavulanate (AUGMENTIN) 875-125 MG tablet Take 1 tablet by mouth 2 (two) times daily.       07/07/2023    1:22 PM 02/18/2023    4:21 PM 11/12/2022   11:29 AM 08/27/2022   11:19 AM  GAD 7 : Generalized Anxiety Score  Nervous, Anxious, on Edge 0 0 0 0  Control/stop worrying 0 0 0 0  Worry too much - different things 0 0 0 0  Trouble relaxing 0 0 0 0  Restless 0 0 0 0  Easily annoyed or irritable 0 0 0 0  Afraid - awful might happen 0 0 0 0  Total GAD 7 Score 0 0 0 0  Anxiety Difficulty Not difficult at all Not difficult at all Not difficult at all Not difficult at all       07/07/2023    1:22 PM 02/19/2023   10:28 AM 02/18/2023    4:21 PM  Depression screen PHQ 2/9  Decreased Interest 0 0 0  Down, Depressed, Hopeless 0 0 0  PHQ - 2 Score 0 0 0  Altered sleeping 0 0 0   Tired, decreased energy 0 0 0  Change in appetite 0 0 0  Feeling bad or failure about yourself  0 0 0  Trouble concentrating 0 0 0  Moving slowly or fidgety/restless 0 0 0  Suicidal thoughts 0 0 0  PHQ-9 Score 0 0 0  Difficult doing work/chores Not difficult at all Not difficult at all Not difficult at all    BP Readings from Last 3 Encounters:  07/07/23 120/70  06/19/23 112/78  02/18/23 118/78    Physical Exam Vitals and nursing note reviewed. Exam conducted with a chaperone present.  Constitutional:      General: She is not irritable.She is not in acute distress.  Appearance: She is not diaphoretic.  HENT:     Head: Normocephalic and atraumatic.     Right Ear: External ear normal.     Left Ear: External ear normal.     Nose: Nose normal.  Eyes:     General:        Right eye: No discharge.        Left eye: No discharge.     Conjunctiva/sclera: Conjunctivae normal.     Pupils: Pupils are equal, round, and reactive to light.  Neck:     Thyroid: No thyromegaly.     Vascular: No JVD.  Cardiovascular:     Rate and Rhythm: Normal rate and regular rhythm.     Heart sounds: Normal heart sounds. No murmur heard.    No friction rub. No gallop.  Pulmonary:     Effort: Pulmonary effort is normal.     Breath sounds: Normal breath sounds. No wheezing, rhonchi or rales.  Chest:     Chest wall: No tenderness.  Abdominal:     General: Bowel sounds are normal.     Palpations: Abdomen is soft. There is no mass.     Tenderness: There is no abdominal tenderness. There is no guarding.  Musculoskeletal:        General: Normal range of motion.     Cervical back: Normal range of motion and neck supple.  Lymphadenopathy:     Cervical: No cervical adenopathy.  Skin:    General: Skin is warm and dry.  Neurological:     Mental Status: She is alert.     Deep Tendon Reflexes: Reflexes are normal and symmetric.     Wt Readings from Last 3 Encounters:  07/07/23 117 lb (53.1 kg)   02/19/23 123 lb (55.8 kg)  02/18/23 123 lb (55.8 kg)    BP 120/70   Pulse 64   Ht 5\' 6"  (1.676 m)   Wt 117 lb (53.1 kg)   SpO2 96%   BMI 18.88 kg/m   Assessment and Plan: 1. Mild intermittent asthma with acute exacerbation Chronic.  Controlled.  Stable.  Continue Singulair 10 mg once a day. - montelukast (SINGULAIR) 10 MG tablet; Take 1 tablet (10 mg total) by mouth daily.  Dispense: 90 tablet; Refill: 1  2. Chronic sinusitis, unspecified location Noticing that patient has elevated leukocyte count which may be due to chronic sinusitis.  Patient has got some nasal drainage that is purulent in nature and we will treat with doxycycline and recheck in 2 weeks with lab work. - doxycycline (VIBRA-TABS) 100 MG tablet; Take 1 tablet (100 mg total) by mouth 2 (two) times daily.  Dispense: 20 tablet; Refill: 0  3. Major depressive disorder in partial remission, unspecified whether recurrent (HCC) Chronic.  Controlled.  Stable.  PHQ was 0 GAD score is 0.  Continue sertraline 100 mg once a day. - sertraline (ZOLOFT) 100 MG tablet; Take 1 tablet (100 mg total) by mouth daily.  Dispense: 90 tablet; Refill: 1  4. Encounter for breast cancer screening using non-mammogram modality Breast exam was done with no palpable mass patient will proceed with 3D screening mammogram. - MM 3D SCREENING MAMMOGRAM BILATERAL BREAST  5. Colon cancer screening Patient was to have a repeat colonoscopy in 5 years and that is at the edge to be done and we will refer to gastroenterology for evaluation. - Ambulatory referral to Gastroenterology  6. Weight loss, unintentional Patient has had a 6 to 7 pound weight loss over the course  of the past year with no history of weight loss or efforts to do so.  Upon return we will check a CMP repeat a CBC that has had leukocytosis with mild elevation of platelets and mild anemia.  Reviewed the last TSH and thyroid which was in normal range we will likely add a ferritin to this  him patient will be given Hemoccult cards to check to see if there is blood in the stool.    Elizabeth Sauer, MD

## 2023-07-08 ENCOUNTER — Telehealth: Payer: Self-pay

## 2023-07-08 ENCOUNTER — Other Ambulatory Visit: Payer: Self-pay

## 2023-07-08 DIAGNOSIS — Z1211 Encounter for screening for malignant neoplasm of colon: Secondary | ICD-10-CM

## 2023-07-08 MED ORDER — SUTAB 1479-225-188 MG PO TABS: 12.0000 | ORAL_TABLET | ORAL | 0 refills | Status: AC

## 2023-07-08 NOTE — Telephone Encounter (Signed)
Gastroenterology Pre-Procedure Review  Patient has been forewarned that due to her cardiac history it would be best to schedule her at [facility] for safety concerns.  She said she would like to have it still at [facility].  I told her that its highly likely that she will need to be moved to [facility] once the anesthesiologist reviews her chart and this could mean a last minute reschedule.  She verbalized understanding and still requested to be scheduled at [facility].    Request Date: 08/18/23 Requesting Physician: Dr. Servando Snare  PATIENT REVIEW QUESTIONS: The patient responded to the following health history questions as indicated:    1. Are you having any GI issues? no 2. Do you have a personal history of Polyps? no 3. Do you have a family history of Colon Cancer or Polyps? no 4. Diabetes Mellitus? no 5. Joint replacements in the past 12 months?no 6. Major health problems in the past 3 months?no 7. Any artificial heart valves, MVP, or defibrillator?no 8. Cardiac issues? Yes. Patient has NICM.  Cardiac clearance to be sent to [facility] Cardiology Dept     MEDICATIONS & ALLERGIES:    Patient reports the following regarding taking any anticoagulation/antiplatelet therapy:   Plavix, Coumadin, Eliquis, Xarelto, Lovenox, Pradaxa, Brilinta, or Effient? no Aspirin? no  Patient confirms/reports the following medications:  Current Outpatient Medications  Medication Sig Dispense Refill   ANORO ELLIPTA 62.5-25 MCG/ACT AEPB TAKE 1 PUFF BY MOUTH EVERY DAY 60 each 5   atorvastatin (LIPITOR) 10 MG tablet Take 1 tablet (10 mg total) by mouth daily. 90 tablet 1   Calcium Carbonate-Vit D-Min (CALCIUM 1200 PO) Take 1 tablet by mouth daily.     carvedilol (COREG) 3.125 MG tablet Take 1 tablet (3.125 mg total) by mouth 2 (two) times daily with a meal. (Patient taking differently: Take 3.125 mg by mouth 2 (two) times daily with a meal. 2 pills BID/ cardio at [facility]) 180 tablet 1   cholecalciferol (VITAMIN D3) 25 MCG (1000 UNIT) tablet Take  1,000 Units by mouth daily.     diclofenac (VOLTAREN) 50 MG EC tablet Take 1 tablet (50 mg total) by mouth 2 (two) times daily as needed. 60 tablet 1   doxycycline (VIBRA-TABS) 100 MG tablet Take 1 tablet (100 mg total) by mouth 2 (two) times daily. 20 tablet 0   ENTRESTO 24-26 MG Take 1 tablet by mouth 2 (two) times daily. [facility]     montelukast (SINGULAIR) 10 MG tablet Take 1 tablet (10 mg total) by mouth daily. 90 tablet 1   Multiple Vitamin (MULTI-VITAMIN) tablet Take 1 tablet by mouth daily.     sertraline (ZOLOFT) 100 MG tablet Take 1 tablet (100 mg total) by mouth daily. 90 tablet 1   vitamin B-12 (CYANOCOBALAMIN) 1000 MCG tablet Take 1,000 mcg by mouth daily.     No current facility-administered medications for this visit.    Patient confirms/reports the following allergies:  No Known Allergies  No orders of the defined types were placed in this encounter.   AUTHORIZATION INFORMATION Primary Insurance: 1D#: Group #:  Secondary Insurance: 1D#: Group #:  SCHEDULE INFORMATION: Date: 08/18/23 Time: Location: [facility]

## 2023-07-16 ENCOUNTER — Other Ambulatory Visit (INDEPENDENT_AMBULATORY_CARE_PROVIDER_SITE_OTHER): Payer: Medicare Other

## 2023-07-16 DIAGNOSIS — R634 Abnormal weight loss: Secondary | ICD-10-CM | POA: Diagnosis not present

## 2023-07-16 LAB — HEMOCCULT GUIAC POC 1CARD (OFFICE)
Card #2 Fecal Occult Blod, POC: NEGATIVE
Card #3 Fecal Occult Blood, POC: NEGATIVE
Fecal Occult Blood, POC: NEGATIVE

## 2023-07-17 ENCOUNTER — Telehealth: Payer: Self-pay

## 2023-07-17 NOTE — Telephone Encounter (Signed)
Cardiac clearance has been received from [facility]iology for colonoscopy scheduled at [facility] with Dr. Midge Minium on 08/18/23.  Patient is cleared to have procedure.  Thanks, [place], [place]

## 2023-07-21 ENCOUNTER — Ambulatory Visit (INDEPENDENT_AMBULATORY_CARE_PROVIDER_SITE_OTHER): Payer: Medicare Other | Admitting: Family Medicine

## 2023-07-21 ENCOUNTER — Encounter: Payer: Self-pay | Admitting: Family Medicine

## 2023-07-21 VITALS — BP 118/62 | HR 100 | Ht 66.0 in | Wt 117.0 lb

## 2023-07-21 DIAGNOSIS — D649 Anemia, unspecified: Secondary | ICD-10-CM | POA: Diagnosis not present

## 2023-07-21 DIAGNOSIS — R634 Abnormal weight loss: Secondary | ICD-10-CM | POA: Diagnosis not present

## 2023-07-21 NOTE — Progress Notes (Signed)
Date:  07/21/2023   Name:  Marilyn Molina   DOB:  07-04-1951   MRN:  [id number]   Chief Complaint: Weight Loss  HPI  Lab Results  Component Value Date   NA 143 01/07/2022   K 5.0 01/07/2022   CO2 22 01/07/2022   GLUCOSE 93 01/07/2022   BUN 13 01/07/2022   CREATININE 0.89 01/07/2022   CALCIUM 9.5 01/07/2022   EGFR 70 01/07/2022   GFRNONAA 84 01/10/2021   Lab Results  Component Value Date   CHOL 152 01/07/2022   HDL 69 01/07/2022   LDLCALC 67 01/07/2022   TRIG 83 01/07/2022   CHOLHDL 2.9 10/08/2018   Lab Results  Component Value Date   TSH 1.430 01/10/2021   No results found for: "HGBA1C" Lab Results  Component Value Date   WBC 10.9 (H) 01/10/2021   HGB 13.7 01/10/2021   HCT 40.5 01/10/2021   MCV 96 01/10/2021   PLT 389 01/10/2021   Lab Results  Component Value Date   ALT 11 01/07/2022   AST 15 01/07/2022   ALKPHOS 87 01/07/2022   BILITOT <0.2 01/07/2022   No results found for: "25OHVITD2", "25OHVITD3", "VD25OH"   Review of Systems  Constitutional: Negative.  Negative for chills and fatigue.  HENT:  Negative for congestion, ear discharge and ear pain.   Respiratory:  Negative for cough and wheezing.   Gastrointestinal:  Negative for abdominal pain.  Genitourinary:  Negative for difficulty urinating, dysuria, flank pain, frequency, hematuria and vaginal bleeding.  Musculoskeletal:  Negative for back pain.  Skin:  Negative for rash.  Neurological:  Negative for dizziness, weakness and headaches.  Hematological:  Negative for adenopathy. Does not bruise/bleed easily.  Psychiatric/Behavioral:  Negative for dysphoric mood. The patient is not nervous/anxious.     Patient Active Problem List   Diagnosis Date Noted   Primary osteoarthritis of right knee 09/10/2022   Centrilobular emphysema (HCC) 04/11/2022   Aortic atherosclerosis (HCC) 04/11/2022   Anxiety 11/21/2015   Essential hypertension 11/21/2015   Anxiety and depression 11/03/2012    Cardiomyopathy (HCC) 10/29/2011   Cigarette nicotine dependence without complication 10/29/2011   Tobacco abuse 10/29/2011    No Known Allergies  Past Surgical History:  Procedure Laterality Date   OVARIAN CYST REMOVAL      Social History   Tobacco Use   Smoking status: Every Day    Current packs/day: 1.00    Average packs/day: 1 pack/day for 51.0 years (51.0 ttl pk-yrs)    Types: Cigarettes   Smokeless tobacco: Never   Tobacco comments:    0.5 PPD 09/04/2022  Vaping Use   Vaping status: Never Used  Substance Use Topics   Alcohol use: No   Drug use: No     Medication list has been reviewed and updated.  Current Meds  Medication Sig   ANORO ELLIPTA 62.5-25 MCG/ACT AEPB TAKE 1 PUFF BY MOUTH EVERY DAY   atorvastatin (LIPITOR) 10 MG tablet Take 1 tablet (10 mg total) by mouth daily.   Calcium Carbonate-Vit D-Min (CALCIUM 1200 PO) Take 1 tablet by mouth daily.   carvedilol (COREG) 3.125 MG tablet Take 1 tablet (3.125 mg total) by mouth 2 (two) times daily with a meal. (Patient taking differently: Take 3.125 mg by mouth 2 (two) times daily with a meal. 2 pills BID/ cardio at [facility])   cholecalciferol (VITAMIN D3) 25 MCG (1000 UNIT) tablet Take 1,000 Units by mouth daily.   diclofenac (VOLTAREN) 50 MG EC tablet Take  1 tablet (50 mg total) by mouth 2 (two) times daily as needed.   ENTRESTO 24-26 MG Take 1 tablet by mouth 2 (two) times daily. [facility]   montelukast (SINGULAIR) 10 MG tablet Take 1 tablet (10 mg total) by mouth daily.   Multiple Vitamin (MULTI-VITAMIN) tablet Take 1 tablet by mouth daily.   sertraline (ZOLOFT) 100 MG tablet Take 1 tablet (100 mg total) by mouth daily.   vitamin B-12 (CYANOCOBALAMIN) 1000 MCG tablet Take 1,000 mcg by mouth daily.   [DISCONTINUED] doxycycline (VIBRA-TABS) 100 MG tablet Take 1 tablet (100 mg total) by mouth 2 (two) times daily.       07/21/2023    2:13 PM 07/07/2023    1:22 PM 02/18/2023    4:21 PM 11/12/2022   11:29 AM  GAD 7  : Generalized Anxiety Score  Nervous, Anxious, on Edge 0 0 0 0  Control/stop worrying 0 0 0 0  Worry too much - different things 0 0 0 0  Trouble relaxing 0 0 0 0  Restless 0 0 0 0  Easily annoyed or irritable 0 0 0 0  Afraid - awful might happen 0 0 0 0  Total GAD 7 Score 0 0 0 0  Anxiety Difficulty Not difficult at all Not difficult at all Not difficult at all Not difficult at all       07/21/2023    2:13 PM 07/07/2023    1:22 PM 02/19/2023   10:28 AM  Depression screen PHQ 2/9  Decreased Interest 0 0 0  Down, Depressed, Hopeless 0 0 0  PHQ - 2 Score 0 0 0  Altered sleeping 0 0 0  Tired, decreased energy 0 0 0  Change in appetite 0 0 0  Feeling bad or failure about yourself  0 0 0  Trouble concentrating 0 0 0  Moving slowly or fidgety/restless 0 0 0  Suicidal thoughts 0 0 0  PHQ-9 Score 0 0 0  Difficult doing work/chores Not difficult at all Not difficult at all Not difficult at all    BP Readings from Last 3 Encounters:  07/21/23 118/62  07/07/23 120/70  06/19/23 112/78    Physical Exam Vitals and nursing note reviewed. Exam conducted with a chaperone present.  Constitutional:      General: She is not in acute distress.    Appearance: She is not diaphoretic.  HENT:     Head: Normocephalic and atraumatic.     Right Ear: Tympanic membrane and external ear normal.     Left Ear: Tympanic membrane and external ear normal.     Nose: Nose normal.     Mouth/Throat:     Mouth: Mucous membranes are moist.  Eyes:     General:        Right eye: No discharge.        Left eye: No discharge.     Conjunctiva/sclera: Conjunctivae normal.     Pupils: Pupils are equal, round, and reactive to light.  Neck:     Thyroid: No thyromegaly.     Vascular: No JVD.  Cardiovascular:     Rate and Rhythm: Normal rate and regular rhythm.     Heart sounds: Normal heart sounds. No murmur heard.    No friction rub. No gallop.  Pulmonary:     Effort: Pulmonary effort is normal.     Breath  sounds: Normal breath sounds. No wheezing, rhonchi or rales.  Abdominal:     General: Bowel sounds are normal.  Palpations: Abdomen is soft. There is no mass.     Tenderness: There is no abdominal tenderness. There is no guarding.  Musculoskeletal:        General: Normal range of motion.     Cervical back: Normal range of motion and neck supple.  Lymphadenopathy:     Cervical: No cervical adenopathy.  Skin:    General: Skin is warm and dry.  Neurological:     Mental Status: She is alert.     Deep Tendon Reflexes: Reflexes are normal and symmetric.     Wt Readings from Last 3 Encounters:  07/21/23 117 lb (53.1 kg)  07/07/23 117 lb (53.1 kg)  02/19/23 123 lb (55.8 kg)    BP 118/62   Pulse 100   Ht 5\' 6"  (1.676 m)   Wt 117 lb (53.1 kg)   SpO2 94%   BMI 18.88 kg/m   Assessment and Plan: 1. Weight loss Chronic.  Stable.  Weight has remained the same.  We do have an upcoming series of tests including mammogram, colonoscopy and low-dose spiral CT of the chest.  Review of CBC notes that his hide very mild anemia with normal indices.  Will wait to see what colonoscopy shows to determine if we need to progress further.  2. Anemia, unspecified type Chronic.  Controlled.  Stable.  Normocytic very mild.  I have suggested that we take a multivitamin with iron and will repeat CBC in 4 months.    Elizabeth Sauer, MD

## 2023-07-29 ENCOUNTER — Ambulatory Visit
Admission: RE | Admit: 2023-07-29 | Discharge: 2023-07-29 | Disposition: A | Discharge status: Home / Self Care | Payer: Medicare Other | Source: Ambulatory Visit

## 2023-07-29 DIAGNOSIS — I2510 Atherosclerotic heart disease of native coronary artery without angina pectoris: Secondary | ICD-10-CM | POA: Insufficient documentation

## 2023-07-29 DIAGNOSIS — J432 Centrilobular emphysema: Secondary | ICD-10-CM | POA: Diagnosis not present

## 2023-07-29 DIAGNOSIS — Z87891 Personal history of nicotine dependence: Secondary | ICD-10-CM

## 2023-07-29 DIAGNOSIS — Z122 Encounter for screening for malignant neoplasm of respiratory organs: Secondary | ICD-10-CM | POA: Diagnosis not present

## 2023-07-29 DIAGNOSIS — F17210 Nicotine dependence, cigarettes, uncomplicated: Secondary | ICD-10-CM | POA: Diagnosis not present

## 2023-07-29 DIAGNOSIS — I700 Atherosclerosis of aorta: Secondary | ICD-10-CM | POA: Diagnosis not present

## 2023-07-31 ENCOUNTER — Ambulatory Visit
Admission: RE | Admit: 2023-07-31 | Discharge: 2023-07-31 | Disposition: A | Discharge status: Home / Self Care | Payer: Medicare Other | Source: Ambulatory Visit | Attending: Family Medicine | Admitting: Family Medicine

## 2023-07-31 DIAGNOSIS — Z1231 Encounter for screening mammogram for malignant neoplasm of breast: Secondary | ICD-10-CM | POA: Insufficient documentation

## 2023-07-31 DIAGNOSIS — Z1239 Encounter for other screening for malignant neoplasm of breast: Secondary | ICD-10-CM | POA: Diagnosis present

## 2023-08-05 ENCOUNTER — Encounter: Payer: Self-pay | Admitting: Gastroenterology

## 2023-08-06 ENCOUNTER — Encounter: Payer: Self-pay | Admitting: Gastroenterology

## 2023-08-06 NOTE — Anesthesia Preprocedure Evaluation (Addendum)
Anesthesia Evaluation  Patient identified by MRN, date of birth, ID band Patient awake    Reviewed: Allergy & Precautions, H&P , NPO status , Patient's Chart, lab work & pertinent test results  Airway Mallampati: II  TM Distance: <3 FB Neck ROM: Full    Dental no notable dental hx.    Pulmonary neg pulmonary ROS, asthma , COPD, Current Smoker   Pulmonary exam normal breath sounds clear to auscultation       Cardiovascular hypertension, negative cardio ROS Normal cardiovascular exam Rhythm:Regular Rate:Normal  Non-ischemic cardiomyopathy, hx EF 20%%, but improved since then.  Last TTE done 03/2022 with LV EDD 51 mm, EF 50%, no significant valvular disease.    Neuro/Psych  PSYCHIATRIC DISORDERS Anxiety Depression    negative neurological ROS  negative psych ROS   GI/Hepatic negative GI ROS, Neg liver ROS,,,  Endo/Other  negative endocrine ROS    Renal/GU negative Renal ROS  negative genitourinary   Musculoskeletal negative musculoskeletal ROS (+) Arthritis ,    Abdominal   Peds negative pediatric ROS (+)  Hematology negative hematology ROS (+)   Anesthesia Other Findings Hypertension  Anxiety and depression High cholesterol  Heart disease Mild intermittent asthma Postural dizziness Smoker Easy bruising Osteoarthritis Non-ischemic cardiomyopathy   Reproductive/Obstetrics negative OB ROS                             Anesthesia Physical Anesthesia Plan  ASA: 3  Anesthesia Plan: General   Post-op Pain Management:    Induction: Intravenous  PONV Risk Score and Plan:   Airway Management Planned: Natural Airway and Nasal Cannula  Additional Equipment:   Intra-op Plan:   Post-operative Plan:   Informed Consent: I have reviewed the patients History and Physical, chart, labs and discussed the procedure including the risks, benefits and alternatives for the proposed anesthesia  with the patient or authorized representative who has indicated his/her understanding and acceptance.     Dental Advisory Given  Plan Discussed with: Anesthesiologist, CRNA and Surgeon  Anesthesia Plan Comments: (Patient consented for risks of anesthesia including but not limited to:  - adverse reactions to medications - risk of airway placement if required - damage to eyes, teeth, lips or other oral mucosa - nerve damage due to positioning  - sore throat or hoarseness - Damage to heart, brain, nerves, lungs, other parts of body or loss of life  Patient voiced understanding.)       Anesthesia Quick Evaluation

## 2023-08-07 ENCOUNTER — Other Ambulatory Visit: Payer: Self-pay | Admitting: Acute Care

## 2023-08-07 DIAGNOSIS — Z122 Encounter for screening for malignant neoplasm of respiratory organs: Secondary | ICD-10-CM

## 2023-08-07 DIAGNOSIS — F17210 Nicotine dependence, cigarettes, uncomplicated: Secondary | ICD-10-CM

## 2023-08-07 DIAGNOSIS — Z87891 Personal history of nicotine dependence: Secondary | ICD-10-CM

## 2023-08-14 ENCOUNTER — Encounter: Payer: Self-pay | Admitting: Physician Assistant

## 2023-08-14 ENCOUNTER — Ambulatory Visit (INDEPENDENT_AMBULATORY_CARE_PROVIDER_SITE_OTHER): Payer: Medicare Other | Admitting: Physician Assistant

## 2023-08-14 VITALS — BP 128/80 | HR 86 | Temp 97.9°F | Ht 66.0 in | Wt 121.0 lb

## 2023-08-14 DIAGNOSIS — Z720 Tobacco use: Secondary | ICD-10-CM | POA: Diagnosis not present

## 2023-08-14 DIAGNOSIS — J0100 Acute maxillary sinusitis, unspecified: Secondary | ICD-10-CM | POA: Diagnosis not present

## 2023-08-14 LAB — POC COVID19 BINAXNOW: SARS Coronavirus 2 Ag: NEGATIVE

## 2023-08-14 MED ORDER — AMOXICILLIN-POT CLAVULANATE 875-125 MG PO TABS
1.0000 | ORAL_TABLET | Freq: Two times a day (BID) | ORAL | 0 refills | Status: AC
Start: 2023-08-16 — End: 2023-08-21

## 2023-08-14 MED ORDER — PSEUDOEPHEDRINE HCL 60 MG PO TABS
60.0000 mg | ORAL_TABLET | Freq: Four times a day (QID) | ORAL | 0 refills | Status: DC | PRN
Start: 2023-08-14 — End: 2023-12-04

## 2023-08-14 NOTE — Progress Notes (Signed)
Date:  08/14/2023   Name:  Marilyn Molina   DOB:  January 20, 1951   MRN:  [id number]   Chief Complaint: Sinus Problem (Has not tested for covid )  Sinus Problem This is a new problem. Episode onset: X5 days. The problem has been gradually worsening since onset. There has been no fever. Her pain is at a severity of 8/10. The pain is moderate. Associated symptoms include congestion, coughing, sinus pressure and sneezing. Pertinent negatives include no shortness of breath. (Feels like water is in right ear  ) Treatments tried: mucinex.   Marilyn Molina is a 72 year old female with history of current tobacco use, emphysema, and chronic sinusitis who presents new to me today, typically sees my colleague Dr. Elizabeth Sauer, MD, here for evaluation of suspected sinus infection.  She has had URI symptoms for the past 5 days including nasal congestion, facial/dental pain, right aural fullness, and cough.  She has not done any home COVID testing.   Chart review shows sinusitis typically twice per year, often treated with antibiotics.    Medication list has been reviewed and updated.  Current Meds  Medication Sig   ANORO ELLIPTA 62.5-25 MCG/ACT AEPB TAKE 1 PUFF BY MOUTH EVERY DAY   atorvastatin (LIPITOR) 10 MG tablet Take 1 tablet (10 mg total) by mouth daily.   Calcium Carbonate-Vit D-Min (CALCIUM 1200 PO) Take 1 tablet by mouth daily.   carvedilol (COREG) 3.125 MG tablet Take 1 tablet (3.125 mg total) by mouth 2 (two) times daily with a meal. (Patient taking differently: Take 3.125 mg by mouth 2 (two) times daily with a meal. 2 pills BID/ cardio at [facility])   cholecalciferol (VITAMIN D3) 25 MCG (1000 UNIT) tablet Take 1,000 Units by mouth daily.   diclofenac (VOLTAREN) 50 MG EC tablet Take 1 tablet (50 mg total) by mouth 2 (two) times daily as needed.   ENTRESTO 24-26 MG Take 1 tablet by mouth 2 (two) times daily. [facility]   montelukast (SINGULAIR) 10 MG tablet Take 1 tablet (10 mg total) by mouth  daily.   Multiple Vitamin (MULTI-VITAMIN) tablet Take 1 tablet by mouth daily.   sertraline (ZOLOFT) 100 MG tablet Take 1 tablet (100 mg total) by mouth daily.   vitamin B-12 (CYANOCOBALAMIN) 1000 MCG tablet Take 1,000 mcg by mouth daily.     Review of Systems  Constitutional:  Negative for fatigue and fever.  HENT:  Positive for congestion, dental problem (dental pain), postnasal drip, sinus pressure, sinus pain and sneezing.        Right aural fullness  Respiratory:  Positive for cough. Negative for chest tightness and shortness of breath.   Cardiovascular:  Negative for chest pain and palpitations.  Gastrointestinal:  Negative for abdominal pain.    Patient Active Problem List   Diagnosis Date Noted   Primary osteoarthritis of right knee 09/10/2022   Centrilobular emphysema (HCC) 04/11/2022   Aortic atherosclerosis (HCC) 04/11/2022   Anxiety 11/21/2015   Essential hypertension 11/21/2015   Anxiety and depression 11/03/2012   Cardiomyopathy (HCC) 10/29/2011   Cigarette nicotine dependence without complication 10/29/2011   Tobacco abuse 10/29/2011    No Known Allergies  Immunization History  Administered Date(s) Administered   Fluad Quad(high Dose 65+) 09/06/2020, 10/31/2021   Influenza, High Dose Seasonal PF 10/08/2018, 07/16/2023   Influenza, Quadrivalent, Recombinant, Inj, Pf 09/20/2019   Influenza, Seasonal, Injecte, Preservative Fre 11/22/2014   Influenza,inj,Quad PF,6+ Mos 11/21/2015, 08/20/2017, 08/27/2022   Influenza-Unspecified 09/02/2013, 08/20/2017, 08/03/2019  Moderna Covid-19 Vaccine Bivalent Booster 69yrs & up 10/31/2021   PFIZER(Purple Top)SARS-COV-2 Vaccination 12/30/2019, 01/25/2020, 10/03/2020   Pneumococcal Conjugate-13 01/01/2018   Pneumococcal Polysaccharide-23 04/08/2019   Zoster Recombinant(Shingrix) 07/16/2023    Past Surgical History:  Procedure Laterality Date   OVARIAN CYST REMOVAL      Social History   Tobacco Use   Smoking status:  Every Day    Current packs/day: 1.00    Average packs/day: 1 pack/day for 51.0 years (51.0 ttl pk-yrs)    Types: Cigarettes   Smokeless tobacco: Never   Tobacco comments:    0.5 PPD 09/04/2022  Vaping Use   Vaping status: Never Used  Substance Use Topics   Alcohol use: No   Drug use: No    Family History  Problem Relation Age of Onset   Supraventricular tachycardia Mother    Stroke Father    Breast cancer Neg Hx         08/14/2023    4:04 PM 07/21/2023    2:13 PM 07/07/2023    1:22 PM 02/18/2023    4:21 PM  GAD 7 : Generalized Anxiety Score  Nervous, Anxious, on Edge 0 0 0 0  Control/stop worrying 0 0 0 0  Worry too much - different things 0 0 0 0  Trouble relaxing 0 0 0 0  Restless 0 0 0 0  Easily annoyed or irritable 0 0 0 0  Afraid - awful might happen 0 0 0 0  Total GAD 7 Score 0 0 0 0  Anxiety Difficulty Not difficult at all Not difficult at all Not difficult at all Not difficult at all       08/14/2023    4:04 PM 07/21/2023    2:13 PM 07/07/2023    1:22 PM  Depression screen PHQ 2/9  Decreased Interest 0 0 0  Down, Depressed, Hopeless 0 0 0  PHQ - 2 Score 0 0 0  Altered sleeping 0 0 0  Tired, decreased energy 1 0 0  Change in appetite 0 0 0  Feeling bad or failure about yourself  0 0 0  Trouble concentrating 0 0 0  Moving slowly or fidgety/restless 0 0 0  Suicidal thoughts 0 0 0  PHQ-9 Score 1 0 0  Difficult doing work/chores Not difficult at all Not difficult at all Not difficult at all    BP Readings from Last 3 Encounters:  08/14/23 128/80  07/21/23 118/62  07/07/23 120/70    Wt Readings from Last 3 Encounters:  08/14/23 121 lb (54.9 kg)  07/21/23 117 lb (53.1 kg)  07/07/23 117 lb (53.1 kg)    BP 128/80   Pulse 86   Temp 97.9 F (36.6 C) (Oral)   Ht 5\' 6"  (1.676 m)   Wt 121 lb (54.9 kg)   SpO2 95%   BMI 19.53 kg/m   Physical Exam Vitals and nursing note reviewed.  Constitutional:      General: She is not in acute distress.     Appearance: Normal appearance.  HENT:     Right Ear: Tympanic membrane normal.     Left Ear: Tympanic membrane normal.     Ears:     Comments: EAC clear bilaterally with good view of TM which is without effusion or erythema.     Nose:     Right Sinus: Maxillary sinus tenderness and frontal sinus tenderness present.     Left Sinus: Maxillary sinus tenderness and frontal sinus tenderness present.  Mouth/Throat:     Mouth: Mucous membranes are moist.     Pharynx: No oropharyngeal exudate or posterior oropharyngeal erythema.  Eyes:     Conjunctiva/sclera: Conjunctivae normal.     Pupils: Pupils are equal, round, and reactive to light.  Cardiovascular:     Rate and Rhythm: Normal rate and regular rhythm.     Heart sounds: No murmur heard.    No friction rub. No gallop.  Pulmonary:     Effort: Pulmonary effort is normal.     Breath sounds: Normal breath sounds. No wheezing, rhonchi or rales.  Lymphadenopathy:     Cervical: No cervical adenopathy.     Recent Labs     Component Value Date/Time   NA 143 01/07/2022 0939   K 5.0 01/07/2022 0939   CL 106 01/07/2022 0939   CO2 22 01/07/2022 0939   GLUCOSE 93 01/07/2022 0939   GLUCOSE 99 07/25/2017 1100   BUN 13 01/07/2022 0939   CREATININE 0.89 01/07/2022 0939   CALCIUM 9.5 01/07/2022 0939   PROT 6.8 01/07/2022 0939   ALBUMIN 4.4 01/07/2022 0939   AST 15 01/07/2022 0939   ALT 11 01/07/2022 0939   ALKPHOS 87 01/07/2022 0939   BILITOT <0.2 01/07/2022 0939   GFRNONAA 84 01/10/2021 0908   GFRAA 96 01/10/2021 0908    Lab Results  Component Value Date   WBC 10.9 (H) 01/10/2021   HGB 13.7 01/10/2021   HCT 40.5 01/10/2021   MCV 96 01/10/2021   PLT 389 01/10/2021   No results found for: "HGBA1C" Lab Results  Component Value Date   CHOL 152 01/07/2022   HDL 69 01/07/2022   LDLCALC 67 01/07/2022   TRIG 83 01/07/2022   CHOLHDL 2.9 10/08/2018   Lab Results  Component Value Date   TSH 1.430 01/10/2021     Assessment and  Plan:  1. Acute non-recurrent maxillary sinusitis Likely viral etiology. Neg COVID test today. Discussed self-limited nature of viral illnesses and advised conservative measures including rest, fluids, and OTC cough/cold medications. Contact precautions advised to limit spread. Encouraged mask wearing and good hand hygiene especially before meals. Call if acutely worsening symptoms or if no improvement in 5 days  Prescribing Sudafed for symptomatic relief.  Delayed prescription for Augmentin available on Saturday, 08/16/2023 if no improvement at all by that time.  - POC COVID-19 - pseudoephedrine (SUDAFED) 60 MG tablet; Take 1 tablet (60 mg total) by mouth every 6 (six) hours as needed for congestion.  Dispense: 20 tablet; Refill: 0 - amoxicillin-clavulanate (AUGMENTIN) 875-125 MG tablet; Take 1 tablet by mouth 2 (two) times daily for 5 days.  Dispense: 10 tablet; Refill: 0  2. Tobacco abuse Discussed relationship between smoking and recurrent sinusitis.  Encouraged smoking cessation.   Return if symptoms worsen or fail to improve.    Alvester Morin, PA-C, DMSc, Nutritionist [facility] [phone]

## 2023-08-18 ENCOUNTER — Other Ambulatory Visit: Payer: Self-pay

## 2023-08-18 ENCOUNTER — Ambulatory Visit: Payer: Medicare Other | Admitting: Anesthesiology

## 2023-08-18 ENCOUNTER — Encounter
Admission: RE | Disposition: A | Discharge status: Home / Self Care | Payer: Self-pay | Source: Home / Self Care | Attending: Gastroenterology

## 2023-08-18 ENCOUNTER — Encounter: Payer: Self-pay | Admitting: Gastroenterology

## 2023-08-18 ENCOUNTER — Ambulatory Visit
Admission: RE | Admit: 2023-08-18 | Discharge: 2023-08-18 | Disposition: A | Discharge status: Home / Self Care | Payer: Medicare Other | Attending: Gastroenterology | Admitting: Gastroenterology

## 2023-08-18 DIAGNOSIS — K640 First degree hemorrhoids: Secondary | ICD-10-CM | POA: Insufficient documentation

## 2023-08-18 DIAGNOSIS — K635 Polyp of colon: Secondary | ICD-10-CM | POA: Diagnosis not present

## 2023-08-18 DIAGNOSIS — F419 Anxiety disorder, unspecified: Secondary | ICD-10-CM | POA: Diagnosis not present

## 2023-08-18 DIAGNOSIS — I10 Essential (primary) hypertension: Secondary | ICD-10-CM | POA: Diagnosis not present

## 2023-08-18 DIAGNOSIS — F32A Depression, unspecified: Secondary | ICD-10-CM | POA: Insufficient documentation

## 2023-08-18 DIAGNOSIS — E7800 Pure hypercholesterolemia, unspecified: Secondary | ICD-10-CM | POA: Diagnosis not present

## 2023-08-18 DIAGNOSIS — F17210 Nicotine dependence, cigarettes, uncomplicated: Secondary | ICD-10-CM | POA: Insufficient documentation

## 2023-08-18 DIAGNOSIS — J4520 Mild intermittent asthma, uncomplicated: Secondary | ICD-10-CM | POA: Insufficient documentation

## 2023-08-18 DIAGNOSIS — I429 Cardiomyopathy, unspecified: Secondary | ICD-10-CM | POA: Diagnosis not present

## 2023-08-18 DIAGNOSIS — Z1211 Encounter for screening for malignant neoplasm of colon: Secondary | ICD-10-CM | POA: Insufficient documentation

## 2023-08-18 DIAGNOSIS — J4489 Other specified chronic obstructive pulmonary disease: Secondary | ICD-10-CM | POA: Diagnosis not present

## 2023-08-18 DIAGNOSIS — K5730 Diverticulosis of large intestine without perforation or abscess without bleeding: Secondary | ICD-10-CM | POA: Insufficient documentation

## 2023-08-18 HISTORY — DX: Unspecified osteoarthritis, unspecified site: M19.90

## 2023-08-18 HISTORY — DX: Mild intermittent asthma, uncomplicated: J45.20

## 2023-08-18 HISTORY — DX: Anxiety disorder, unspecified: F41.9

## 2023-08-18 HISTORY — DX: Depression, unspecified: F32.A

## 2023-08-18 HISTORY — DX: Spontaneous ecchymoses: R23.3

## 2023-08-18 HISTORY — DX: Abnormal weight loss: R63.4

## 2023-08-18 HISTORY — PX: POLYPECTOMY: SHX5525

## 2023-08-18 HISTORY — DX: Dizziness and giddiness: R42

## 2023-08-18 HISTORY — DX: Other cardiomyopathies: I42.8

## 2023-08-18 HISTORY — DX: Nicotine dependence, unspecified, uncomplicated: F17.200

## 2023-08-18 HISTORY — PX: COLONOSCOPY WITH PROPOFOL: SHX5780

## 2023-08-18 SURGERY — COLONOSCOPY WITH PROPOFOL
Anesthesia: General | Site: Rectum

## 2023-08-18 MED ORDER — PROPOFOL 10 MG/ML IV BOLUS
INTRAVENOUS | Status: DC | PRN
  Administered 2023-08-18 (×2): 40 mg via INTRAVENOUS
  Administered 2023-08-18: 20 mg via INTRAVENOUS
  Administered 2023-08-18: 30 mg via INTRAVENOUS
  Administered 2023-08-18: 20 mg via INTRAVENOUS
  Administered 2023-08-18: 90 mg via INTRAVENOUS

## 2023-08-18 MED ORDER — LIDOCAINE HCL (CARDIAC) PF 100 MG/5ML IV SOSY
PREFILLED_SYRINGE | INTRAVENOUS | Status: DC | PRN
  Administered 2023-08-18: 60 mg via INTRAVENOUS

## 2023-08-18 MED ORDER — STERILE WATER FOR IRRIGATION IR SOLN
Status: DC | PRN
  Administered 2023-08-18: 1

## 2023-08-18 MED ORDER — LACTATED RINGERS IV SOLN: INTRAVENOUS | Status: DC

## 2023-08-18 MED ORDER — SODIUM CHLORIDE 0.9 % IV SOLN: INTRAVENOUS | Status: DC

## 2023-08-18 SURGICAL SUPPLY — 21 items

## 2023-08-18 NOTE — Op Note (Signed)
[facility] Patient Name: Marilyn Molina Procedure Date: 08/18/2023 8:19 AM MRN: [id number] Account #: [id number] Date of Birth: [birth year] Admit Type: Outpatient Age: 72 Room: [facility] OR ROOM 01 Gender: Female Note Status: Finalized Instrument Name: [id number] Procedure:             Colonoscopy Indications:           Screening for colorectal malignant neoplasm Providers:             Midge Minium MD, MD Referring MD:          Duanne Limerick, MD (Referring MD) Medicines:             Propofol per Anesthesia Complications:         No immediate complications. Procedure:             Pre-Anesthesia Assessment:                        - Prior to the procedure, a History and Physical was                         performed, and patient medications and allergies were                         reviewed. The patient's tolerance of previous                         anesthesia was also reviewed. The risks and benefits                         of the procedure and the sedation options and risks                         were discussed with the patient. All questions were                         answered, and informed consent was obtained. Prior                         Anticoagulants: The patient has taken no anticoagulant                         or antiplatelet agents. ASA Grade Assessment: II - A                         patient with mild systemic disease. After reviewing                         the risks and benefits, the patient was deemed in                         satisfactory condition to undergo the procedure.                        After obtaining informed consent, the colonoscope was                         passed under direct vision. Throughout the procedure,  the patient's blood pressure, pulse, and oxygen                         saturations were monitored continuously. The                         Colonoscope was introduced through the anus  and                         advanced to the the cecum, identified by appendiceal                         orifice and ileocecal valve. The colonoscopy was                         performed without difficulty. The patient tolerated                         the procedure well. The quality of the bowel                         preparation was excellent. Findings:      The perianal and digital rectal examinations were normal.      A 4 mm polyp was found in the sigmoid colon. The polyp was sessile. The       polyp was removed with a cold snare. Resection and retrieval were       complete.      Multiple small-mouthed diverticula were found in the sigmoid colon,       transverse colon and ascending colon.      Non-bleeding internal hemorrhoids were found during retroflexion. The       hemorrhoids were Grade I (internal hemorrhoids that do not prolapse). Impression:            - One 4 mm polyp in the sigmoid colon, removed with a                         cold snare. Resected and retrieved.                        - Diverticulosis in the sigmoid colon, in the                         transverse colon and in the ascending colon.                        - Non-bleeding internal hemorrhoids. Recommendation:        - Discharge patient to home.                        - Resume previous diet.                        - Continue present medications.                        - Await pathology results.                        - Repeat colonoscopy is not recommended for  surveillance. Procedure Code(s):     --- Professional ---                        [phone], Colonoscopy, flexible; with removal of                         tumor(s), polyp(s), or other lesion(s) by snare                         technique Diagnosis Code(s):     --- Professional ---                        Z12.11, Encounter for screening for malignant neoplasm                         of colon                        D12.5, Benign  neoplasm of sigmoid colon CPT copyright 2022 American Medical Association. All rights reserved. The codes documented in this report are preliminary and upon coder review may  be revised to meet current compliance requirements. Midge Minium MD, MD 08/18/2023 8:53:42 AM This report has been signed electronically. Number of Addenda: 0 Note Initiated On: 08/18/2023 8:19 AM Scope Withdrawal Time: 0 hours 9 minutes 54 seconds  Total Procedure Duration: 0 hours 21 minutes 10 seconds  Estimated Blood Loss:  Estimated blood loss: none.      [facility]

## 2023-08-18 NOTE — H&P (Signed)
Marilyn Minium, MD [facility] [address] [place], [place] [address] Phone: [phone] Fax : [phone]  Primary Care Physician:  Marilyn Limerick, MD Primary Gastroenterologist:  Dr. Servando Molina  Pre-Procedure History & Physical: HPI:  Marilyn Molina is a 72 y.o. female is here for a screening colonoscopy.   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 Authorizing Provider  amoxicillin-clavulanate (AUGMENTIN) 875-125 MG tablet Take 1 tablet by mouth 2 (two) times daily for 5 days. 08/16/23 08/21/23 Yes Waddell, Melton Alar, PA  ANORO ELLIPTA 62.5-25 MCG/ACT AEPB TAKE 1 PUFF BY MOUTH EVERY DAY 03/17/23  Yes Salena Saner, MD  atorvastatin (LIPITOR) 10 MG tablet Take 1 tablet (10 mg total) by mouth daily. 11/12/22  Yes Marilyn Limerick, MD  Calcium Carbonate-Vit D-Min (CALCIUM 1200 PO) Take 1 tablet by mouth daily.   Yes [provider]  carvedilol (COREG) 3.125 MG tablet Take 1 tablet (3.125 mg total) by mouth 2 (two) times daily with a meal. Patient taking differently: Take 3.125 mg by mouth 2 (two) times daily with a meal. 2 pills BID/ cardio at [facility] 01/07/22  Yes Marilyn Limerick, MD  cholecalciferol (VITAMIN D3) 25 MCG (1000 UNIT) tablet Take 1,000 Units by mouth daily.   Yes [provider]  ENTRESTO 24-26 MG Take 1 tablet by mouth 2 (two) times daily. [facility] 04/08/22  Yes [provider]  montelukast (SINGULAIR) 10 MG tablet Take 1 tablet (10 mg total) by mouth daily. 07/07/23  Yes Marilyn Limerick, MD  Multiple Vitamin  (MULTI-VITAMIN) tablet Take 1 tablet by mouth daily. 10/19/10  Yes [provider]  pseudoephedrine (SUDAFED) 60 MG tablet Take 1 tablet (60 mg total) by mouth every 6 (six) hours as needed for congestion. 08/14/23  Yes Remo Lipps, PA  sertraline (ZOLOFT) 100 MG tablet Take 1 tablet (100 mg total) by mouth daily. 07/07/23  Yes Marilyn Limerick, MD  vitamin B-12 (CYANOCOBALAMIN) 1000 MCG tablet Take 1,000 mcg by mouth daily.   Yes [provider]  diclofenac (VOLTAREN) 50 MG EC tablet Take 1 tablet (50 mg total) by mouth 2 (two) times daily as needed. 06/12/23   Jerrol Banana, MD    Allergies as of 07/08/2023   (No Known Allergies)    Family History  Problem Relation Age of Onset   Supraventricular tachycardia Mother    Stroke Father    Breast cancer Neg Hx     Social History   Socioeconomic History   Marital status: Widowed    Spouse name: Not on file   Number of children: 2   Years of education: Not on file   Highest education level: Bachelor's degree (e.g., BA, AB, BS)  Occupational History    Comment: retired  Tobacco Use   Smoking status: Every Day    Current packs/day: 1.00    Average packs/day: 1 pack/day for 51.0 years (51.0 ttl  pk-yrs)    Types: Cigarettes   Smokeless tobacco: Never   Tobacco comments:    0.5 PPD 09/04/2022  Vaping Use   Vaping status: Never Used  Substance and Sexual Activity   Alcohol use: No   Drug use: No   Sexual activity: Never  Other Topics Concern   Not on file  Social History Narrative   Pt's son and granddaughter live with her.    Social Determinants of Health   Financial Resource Strain: Low Risk  (07/17/2023)   Overall Financial Resource Strain (CARDIA)    Difficulty of Paying Living Expenses: Not hard at all  Food Insecurity: No Food Insecurity (07/17/2023)   Hunger Vital Sign    Worried About Running Out of Food in the Last Year: Never true    Ran Out of Food in the Last Year: Never true   Transportation Needs: No Transportation Needs (07/17/2023)   PRAPARE - Administrator, Civil Service (Medical): No    Lack of Transportation (Non-Medical): No  Physical Activity: Insufficiently Active (07/17/2023)   Exercise Vital Sign    Days of Exercise per Week: 3 days    Minutes of Exercise per Session: 30 min  Stress: No Stress Concern Present (07/17/2023)   [employer] of Occupational Health - Occupational Stress Questionnaire    Feeling of Stress : Only a little  Social Connections: Moderately Integrated (07/17/2023)   Social Connection and Isolation Panel [NHANES]    Frequency of Communication with Friends and Family: More than three times a week    Frequency of Social Gatherings with Friends and Family: Twice a week    Attends Religious Services: 1 to 4 times per year    Active Member of [employer] or Organizations: No    Attends Banker Meetings: Never    Marital Status: Living with partner  Intimate Partner Violence: Not At Risk (02/19/2023)   Humiliation, Afraid, Rape, and Kick questionnaire    Fear of Current or Ex-Partner: No    Emotionally Abused: No    Physically Abused: No    Sexually Abused: No    Review of Systems: See HPI, otherwise negative ROS  Physical Exam: BP 135/71   Pulse 92   Temp 97.9 F (36.6 C) (Temporal)   Resp 18   Ht 5\' 6"  (1.676 m)   Wt 55 kg   SpO2 97%   BMI 19.57 kg/m  General:   Alert,  pleasant and cooperative in NAD Head:  Normocephalic and atraumatic. Neck:  Supple; no masses or thyromegaly. Lungs:  Clear throughout to auscultation.    Heart:  Regular rate and rhythm. Abdomen:  Soft, nontender and nondistended. Normal bowel sounds, without guarding, and without rebound.   Neurologic:  Alert and  oriented x4;  grossly normal neurologically.  Impression/Plan: Marilyn Molina is now here to undergo a screening colonoscopy.  Risks, benefits, and alternatives regarding colonoscopy have been reviewed  with the patient.  Questions have been answered.  All parties agreeable.

## 2023-08-18 NOTE — Anesthesia Postprocedure Evaluation (Signed)
Anesthesia Post Note  Patient: Gwenneth Jarnagin [facility]  Procedure(s) Performed: COLONOSCOPY WITH PROPOFOL (Rectum) POLYPECTOMY (Rectum)  Patient location during evaluation: PACU Anesthesia Type: General Level of consciousness: awake and alert Pain management: pain level controlled Vital Signs Assessment: post-procedure vital signs reviewed and stable Respiratory status: spontaneous breathing, nonlabored ventilation, respiratory function stable and patient connected to nasal cannula oxygen Cardiovascular status: blood pressure returned to baseline and stable Postop Assessment: no apparent nausea or vomiting Anesthetic complications: no   No notable events documented.   Last Vitals:  Vitals:   08/18/23 0858 08/18/23 0906  BP:  (!) 126/97  Pulse: (!) 56 65  Resp: (!) 25 (!) 30  Temp: 36.8 C   SpO2: 97% 97%    Last Pain:  Vitals:   08/18/23 0906  TempSrc:   PainSc: 0-No pain                 Marisue Humble

## 2023-08-18 NOTE — Transfer of Care (Signed)
Immediate Anesthesia Transfer of Care Note  Patient: Marilyn Molina [facility]  Procedure(s) Performed: COLONOSCOPY WITH PROPOFOL (Rectum) POLYPECTOMY (Rectum)  Patient Location: PACU  Anesthesia Type: General  Level of Consciousness: awake, alert  and patient cooperative  Airway and Oxygen Therapy: Patient Spontanous Breathing and Patient connected to supplemental oxygen  Post-op Assessment: Post-op Vital signs reviewed, Patient's Cardiovascular Status Stable, Respiratory Function Stable, Patent Airway and No signs of Nausea or vomiting  Post-op Vital Signs: Reviewed and stable  Complications: No notable events documented.

## 2023-08-22 ENCOUNTER — Encounter: Payer: Self-pay | Admitting: Gastroenterology

## 2023-08-22 LAB — SURGICAL PATHOLOGY

## 2023-08-26 ENCOUNTER — Ambulatory Visit: Payer: Medicare Other | Admitting: Pulmonary Disease

## 2023-08-26 ENCOUNTER — Encounter: Payer: Self-pay | Admitting: Pulmonary Disease

## 2023-08-26 VITALS — BP 110/72 | HR 72 | Temp 97.4°F | Ht 66.0 in | Wt 116.8 lb

## 2023-08-26 DIAGNOSIS — I2584 Coronary atherosclerosis due to calcified coronary lesion: Secondary | ICD-10-CM

## 2023-08-26 DIAGNOSIS — I2510 Atherosclerotic heart disease of native coronary artery without angina pectoris: Secondary | ICD-10-CM | POA: Diagnosis not present

## 2023-08-26 DIAGNOSIS — J449 Chronic obstructive pulmonary disease, unspecified: Secondary | ICD-10-CM

## 2023-08-26 DIAGNOSIS — F17210 Nicotine dependence, cigarettes, uncomplicated: Secondary | ICD-10-CM | POA: Diagnosis not present

## 2023-08-26 MED ORDER — ALBUTEROL SULFATE HFA 108 (90 BASE) MCG/ACT IN AERS: 2.0000 | INHALATION_SPRAY | Freq: Four times a day (QID) | RESPIRATORY_TRACT | 2 refills | Status: AC | PRN

## 2023-08-26 MED ORDER — ANORO ELLIPTA 62.5-25 MCG/ACT IN AEPB: 1.0000 | INHALATION_SPRAY | Freq: Every day | RESPIRATORY_TRACT | 5 refills | Status: AC

## 2023-08-26 NOTE — Patient Instructions (Signed)
We have renewed your Anoro.  We sent in a prescription for albuterol which is a rescue inhaler that you can carry with you in your purse in case you get any shortness of breath during the day.  Discussed that the CT of the chest looked good with regards to the lungs.  It did show that you do have COPD which is already known.  You have some small spots in the lung that have been there and are not changed these are not cancer.  You do have some hardening/calcifications of your heart arteries you should probably have your cardiologist check on this.  We will see you in follow-up in 6 months time.  Prior to your follow-up appointment you will have a breathing test done to update and make sure your lung function is staying stable.  Work on quitting smoking.

## 2023-08-26 NOTE — Progress Notes (Signed)
Subjective:    Patient ID: Marilyn Molina, female    DOB: [birth year], 72 y.o.   MRN: [id number]  Patient Care Team: Duanne Limerick, MD as PCP - General (Family Medicine) Salena Saner, MD as Consulting Physician (Pulmonary Disease)  Chief Complaint  Patient presents with   Follow-up    Breathing is good. No SOB or wheezing. Cough at night with yellow sputum.     HPI Patient is a 72 year old current smoker (half PPD, 43 PY) who presents for follow-up on the issue of abnormal low-dose CT chest and stage II COPD. The patient was last seen here on 04 September 2022. At that time she had a lung nodule noted on lung cancer screening that was suspicious however, follow-up CT chest that revealed that the nodule had resolved indicating that this was an inflammatory process. She had PFTs performed on 15 July 2022 consistent with moderate COPD/emphysema. She has been started on [employer]. She notes that this medication helps shortness of breath significantly. She continues to stay active. Has not had any fevers, chills or sweats. No chest pain. No cough or sputum production. No lower extremity edema nor calf tenderness.  She does need refill on albuterol inhaler which she uses rarely but likes to keep on hand.  Overall she feels well and looks well.   She had lung cancer screening CT performed 29 July 2023 this was a lung RADS 2S showing benign appearance and behavior within the lung windows however it does show some significant coronary artery calcifications the patient was advised however that this was a nongated test but encouraged to talk to her cardiologist about this.  She does have a nonischemic cardiomyopathy and follows at [facility] cardiology.  Patient is up-to-date on influenza vaccine.  Review of Systems A 10 point review of systems was performed and it is as noted above otherwise negative.   Patient Active Problem List   Diagnosis Date Noted   Encounter for screening  colonoscopy 08/18/2023   Polyp of sigmoid colon 08/18/2023   Primary osteoarthritis of right knee 09/10/2022   Centrilobular emphysema (HCC) 04/11/2022   Aortic atherosclerosis (HCC) 04/11/2022   Anxiety 11/21/2015   Essential hypertension 11/21/2015   Anxiety and depression 11/03/2012   Cardiomyopathy (HCC) 10/29/2011   Cigarette nicotine dependence without complication 10/29/2011   Tobacco abuse 10/29/2011    Social History   Tobacco Use   Smoking status: Every Day    Current packs/day: 1.00    Average packs/day: 1 pack/day for 51.0 years (51.0 ttl pk-yrs)    Types: Cigarettes   Smokeless tobacco: Never   Tobacco comments:    0.5 PPD 08/26/2023 khj  Substance Use Topics   Alcohol use: No    No Known Allergies  Current Meds  Medication Sig   albuterol (VENTOLIN HFA) 108 (90 Base) MCG/ACT inhaler Inhale 2 puffs into the lungs every 6 (six) hours as needed for wheezing or shortness of breath.   atorvastatin (LIPITOR) 10 MG tablet Take 1 tablet (10 mg total) by mouth daily.   Calcium Carbonate-Vit D-Min (CALCIUM 1200 PO) Take 1 tablet by mouth daily.   carvedilol (COREG) 3.125 MG tablet Take 1 tablet (3.125 mg total) by mouth 2 (two) times daily with a meal. (Patient taking differently: Take 3.125 mg by mouth 2 (two) times daily with a meal. 2 pills BID/ cardio at [facility])   cholecalciferol (VITAMIN D3) 25 MCG (1000 UNIT) tablet Take 1,000 Units by mouth daily.   ENTRESTO  24-26 MG Take 1 tablet by mouth 2 (two) times daily. [facility]   montelukast (SINGULAIR) 10 MG tablet Take 1 tablet (10 mg total) by mouth daily.   Multiple Vitamin (MULTI-VITAMIN) tablet Take 1 tablet by mouth daily.   pseudoephedrine (SUDAFED) 60 MG tablet Take 1 tablet (60 mg total) by mouth every 6 (six) hours as needed for congestion.   sertraline (ZOLOFT) 100 MG tablet Take 1 tablet (100 mg total) by mouth daily.   vitamin B-12 (CYANOCOBALAMIN) 1000 MCG tablet Take 1,000 mcg by mouth daily.    [DISCONTINUED] ANORO ELLIPTA 62.5-25 MCG/ACT AEPB TAKE 1 PUFF BY MOUTH EVERY DAY    Immunization History  Administered Date(s) Administered   Fluad Quad(high Dose 65+) 09/06/2020, 10/31/2021   Influenza, High Dose Seasonal PF 10/08/2018, 07/16/2023   Influenza, Quadrivalent, Recombinant, Inj, Pf 09/20/2019   Influenza, Seasonal, Injecte, Preservative Fre 11/22/2014   Influenza,inj,Quad PF,6+ Mos 11/21/2015, 08/20/2017, 08/27/2022   Influenza-Unspecified 09/02/2013, 08/20/2017, 08/03/2019   Moderna Covid-19 Vaccine Bivalent Booster 46yrs & up 10/31/2021   PFIZER(Purple Top)SARS-COV-2 Vaccination 12/30/2019, 01/25/2020, 10/03/2020   Pneumococcal Conjugate-13 01/01/2018   Pneumococcal Polysaccharide-23 04/08/2019   Zoster Recombinant(Shingrix) 07/16/2023      Objective:     BP 110/72 (BP Location: Left Arm, Cuff Size: Normal)   Pulse 72   Temp (!) 97.4 F (36.3 C)   Ht 5\' 6"  (1.676 m)   Wt 116 lb 12.8 oz (53 kg)   SpO2 98%   BMI 18.85 kg/m   SpO2: 98 % O2 Device: None (Room air)  GENERAL: Well-developed, well-nourished woman, no acute distress.  Fully ambulatory.  No conversational dyspnea. HEAD: Normocephalic, atraumatic.  EYES: Pupils equal, round, reactive to light.  No scleral icterus.  MOUTH: Oral mucosa moist.  No thrush. NECK: Supple. No thyromegaly. Trachea midline. No JVD.  No adenopathy. PULMONARY: Good air entry bilaterally.  Coarse breath sounds otherwise, no adventitious sounds. CARDIOVASCULAR: S1 and S2. Regular rate and rhythm.  No rubs, murmurs or gallops heard. ABDOMEN: Benign. MUSCULOSKELETAL: No joint deformity, no clubbing, no edema.  NEUROLOGIC: No overt focal deficit, no gait disturbance, speech is fluent. SKIN: Intact,warm,dry. PSYCH: Mood and behavior normal.    Assessment & Plan:     ICD-10-CM   1. Stage 2 moderate COPD by GOLD classification (HCC)  J44.9 Pulmonary Function Test [facility] Only   Reassess with PFTs Continue Anoro Ellipta Refill  albuterol for as needed use    2. Coronary artery calcification  I25.10    I25.84    Patient to discuss with cardiology Follows at [facility] for nonischemic cardiomyopathy    3. Tobacco dependence due to cigarettes  F17.210    Patient was counseled regards to discontinuation of smoking Total counseling time 3 to 5 minutes      Orders Placed This Encounter  Procedures   Pulmonary Function Test [facility] Only    Standing Status:   Future    Standing Expiration Date:   08/25/2024    Order Specific Question:   Full PFT: includes the following: basic spirometry, spirometry pre & post bronchodilator, diffusion capacity (DLCO), lung volumes    Answer:   Full PFT    Order Specific Question:   This test can only be performed at    Answer:   [facility]    Meds ordered this encounter  Medications   umeclidinium-vilanterol (ANORO ELLIPTA) 62.5-25 MCG/ACT AEPB    Sig: Inhale 1 puff into the lungs daily.    Dispense:  60 each  Refill:  5   albuterol (VENTOLIN HFA) 108 (90 Base) MCG/ACT inhaler    Sig: Inhale 2 puffs into the lungs every 6 (six) hours as needed for wheezing or shortness of breath.    Dispense:  8.5 g    Refill:  2   Patient is doing well on Anoro Ellipta.  She had a refill on albuterol sent in to her pharmacy today.  We discussed the results of her recent lung cancer screening CT.  We discussed the implications of coronary artery calcifications and she will discuss this with her cardiologist.  She was also counseled regards to discontinuation of smoking   C. Danice Goltz, MD Advanced Bronchoscopy P[facility]    *This note was dictated using voice recognition software/Dragon.  Despite best efforts to proofread, errors can occur which can change the meaning. Any transcriptional errors that result from this process are unintentional and may not be fully corrected at the time of dictation.

## 2023-09-02 DIAGNOSIS — Z961 Presence of intraocular lens: Secondary | ICD-10-CM | POA: Diagnosis not present

## 2023-09-02 DIAGNOSIS — Z135 Encounter for screening for eye and ear disorders: Secondary | ICD-10-CM | POA: Diagnosis not present

## 2023-09-02 DIAGNOSIS — H43813 Vitreous degeneration, bilateral: Secondary | ICD-10-CM | POA: Diagnosis not present

## 2023-09-02 DIAGNOSIS — H5213 Myopia, bilateral: Secondary | ICD-10-CM | POA: Diagnosis not present

## 2023-09-02 DIAGNOSIS — H52223 Regular astigmatism, bilateral: Secondary | ICD-10-CM | POA: Diagnosis not present

## 2023-09-02 DIAGNOSIS — H524 Presbyopia: Secondary | ICD-10-CM | POA: Diagnosis not present

## 2023-11-13 ENCOUNTER — Ambulatory Visit: Payer: Medicare Other | Admitting: Family Medicine

## 2023-11-17 ENCOUNTER — Ambulatory Visit
Admission: RE | Admit: 2023-11-17 | Discharge: 2023-11-17 | Disposition: A | Discharge status: Home / Self Care | Payer: Medicare Other | Attending: Family Medicine | Admitting: Family Medicine

## 2023-11-17 ENCOUNTER — Ambulatory Visit (INDEPENDENT_AMBULATORY_CARE_PROVIDER_SITE_OTHER)
Admission: RE | Admit: 2023-11-17 | Discharge: 2023-11-17 | Disposition: A | Discharge status: Home / Self Care | Payer: Medicare Other | Source: Ambulatory Visit | Attending: Family Medicine | Admitting: Family Medicine

## 2023-11-17 ENCOUNTER — Ambulatory Visit (INDEPENDENT_AMBULATORY_CARE_PROVIDER_SITE_OTHER): Payer: Medicare Other | Admitting: Family Medicine

## 2023-11-17 VITALS — BP 124/70 | HR 78 | Ht 66.0 in | Wt 115.0 lb

## 2023-11-17 DIAGNOSIS — M25551 Pain in right hip: Secondary | ICD-10-CM | POA: Diagnosis not present

## 2023-11-17 DIAGNOSIS — M25552 Pain in left hip: Secondary | ICD-10-CM

## 2023-11-17 DIAGNOSIS — M160 Bilateral primary osteoarthritis of hip: Secondary | ICD-10-CM | POA: Diagnosis not present

## 2023-11-17 DIAGNOSIS — J0100 Acute maxillary sinusitis, unspecified: Secondary | ICD-10-CM | POA: Diagnosis not present

## 2023-11-17 DIAGNOSIS — I878 Other specified disorders of veins: Secondary | ICD-10-CM | POA: Diagnosis not present

## 2023-11-17 MED ORDER — AMOXICILLIN-POT CLAVULANATE 875-125 MG PO TABS
1.0000 | ORAL_TABLET | Freq: Two times a day (BID) | ORAL | 0 refills | Status: DC
Start: 2023-11-17 — End: 2023-12-04

## 2023-11-17 MED ORDER — MELOXICAM 15 MG PO TABS
15.0000 mg | ORAL_TABLET | Freq: Every day | ORAL | 0 refills | Status: DC
Start: 2023-11-17 — End: 2023-12-04

## 2023-11-17 NOTE — Progress Notes (Signed)
Date:  11/17/2023   Name:  Marilyn Molina   DOB:  1951/07/22   MRN:  [id number]   Chief Complaint: Groin Pain (Lower abdominal pain that started a month and a half ago. Intermittent. Achy, and sharp pains. No issues with urinating, and no blood present. Sometimes has constipation. No blood present in stool.  )  Groin Pain Primary symptoms comment: inguinal pain. This is a chronic problem. The current episode started more than 1 month ago. The problem occurs constantly. The problem has been waxing and waning. The pain is moderate. The problem affects both sides. Associated symptoms include constipation. Pertinent negatives include no abdominal pain, anorexia, back pain, chills, diarrhea, discolored urine, dysuria, fever, flank pain, frequency, headaches, hematuria, joint pain, joint swelling, nausea, painful intercourse, rash, sore throat, urgency or vomiting. The symptoms are aggravated by bowel movements. The treatment provided moderate relief. She is not sexually active.    Lab Results  Component Value Date   NA 143 01/07/2022   K 5.0 01/07/2022   CO2 22 01/07/2022   GLUCOSE 93 01/07/2022   BUN 13 01/07/2022   CREATININE 0.89 01/07/2022   CALCIUM 9.5 01/07/2022   EGFR 70 01/07/2022   GFRNONAA 84 01/10/2021   Lab Results  Component Value Date   CHOL 152 01/07/2022   HDL 69 01/07/2022   LDLCALC 67 01/07/2022   TRIG 83 01/07/2022   CHOLHDL 2.9 10/08/2018   Lab Results  Component Value Date   TSH 1.430 01/10/2021   No results found for: "HGBA1C" Lab Results  Component Value Date   WBC 10.9 (H) 01/10/2021   HGB 13.7 01/10/2021   HCT 40.5 01/10/2021   MCV 96 01/10/2021   PLT 389 01/10/2021   Lab Results  Component Value Date   ALT 11 01/07/2022   AST 15 01/07/2022   ALKPHOS 87 01/07/2022   BILITOT <0.2 01/07/2022   No results found for: "25OHVITD2", "25OHVITD3", "VD25OH"   Review of Systems  Constitutional:  Negative for chills and fever.  HENT:  Negative  for sore throat.   Respiratory:  Negative for shortness of breath and wheezing.   Cardiovascular:  Negative for chest pain, palpitations and leg swelling.  Gastrointestinal:  Positive for constipation. Negative for abdominal pain, anorexia, diarrhea, nausea and vomiting.  Genitourinary:  Negative for dysuria, flank pain, frequency, hematuria and urgency.  Musculoskeletal:  Negative for back pain and joint pain.  Skin:  Negative for rash.  Neurological:  Negative for headaches.    Patient Active Problem List   Diagnosis Date Noted   Encounter for screening colonoscopy 08/18/2023   Polyp of sigmoid colon 08/18/2023   Primary osteoarthritis of right knee 09/10/2022   Centrilobular emphysema (HCC) 04/11/2022   Aortic atherosclerosis (HCC) 04/11/2022   Anxiety 11/21/2015   Essential hypertension 11/21/2015   Anxiety and depression 11/03/2012   Cardiomyopathy (HCC) 10/29/2011   Cigarette nicotine dependence without complication 10/29/2011   Tobacco abuse 10/29/2011    No Known Allergies  Past Surgical History:  Procedure Laterality Date   COLONOSCOPY WITH PROPOFOL N/A 08/18/2023   Procedure: COLONOSCOPY WITH PROPOFOL;  Surgeon: Midge Minium, MD;  Location: [facility];  Service: Endoscopy;  Laterality: N/A;   OVARIAN CYST REMOVAL     POLYPECTOMY  08/18/2023   Procedure: POLYPECTOMY;  Surgeon: Midge Minium, MD;  Location: [facility];  Service: Endoscopy;;    Social History   Tobacco Use   Smoking status: Every Day    Current packs/day: 1.00  Average packs/day: 1 pack/day for 51.0 years (51.0 ttl pk-yrs)    Types: Cigarettes   Smokeless tobacco: Never   Tobacco comments:    0.5 PPD 08/26/2023 khj  Vaping Use   Vaping status: Never Used  Substance Use Topics   Alcohol use: No   Drug use: No     Medication list has been reviewed and updated.  Current Meds  Medication Sig   albuterol (VENTOLIN HFA) 108 (90 Base) MCG/ACT inhaler Inhale 2 puffs into the  lungs every 6 (six) hours as needed for wheezing or shortness of breath.   atorvastatin (LIPITOR) 10 MG tablet Take 1 tablet (10 mg total) by mouth daily.   Calcium Carbonate-Vit D-Min (CALCIUM 1200 PO) Take 1 tablet by mouth daily.   carvedilol (COREG) 3.125 MG tablet Take 1 tablet (3.125 mg total) by mouth 2 (two) times daily with a meal. (Patient taking differently: Take 3.125 mg by mouth 2 (two) times daily with a meal. 2 pills BID/ cardio at [facility])   cholecalciferol (VITAMIN D3) 25 MCG (1000 UNIT) tablet Take 1,000 Units by mouth daily.   ENTRESTO 24-26 MG Take 1 tablet by mouth 2 (two) times daily. [facility]   montelukast (SINGULAIR) 10 MG tablet Take 1 tablet (10 mg total) by mouth daily.   Multiple Vitamin (MULTI-VITAMIN) tablet Take 1 tablet by mouth daily.   pseudoephedrine (SUDAFED) 60 MG tablet Take 1 tablet (60 mg total) by mouth every 6 (six) hours as needed for congestion.   sertraline (ZOLOFT) 100 MG tablet Take 1 tablet (100 mg total) by mouth daily.   umeclidinium-vilanterol (ANORO ELLIPTA) 62.5-25 MCG/ACT AEPB Inhale 1 puff into the lungs daily.   vitamin B-12 (CYANOCOBALAMIN) 1000 MCG tablet Take 1,000 mcg by mouth daily.   [DISCONTINUED] diclofenac (VOLTAREN) 50 MG EC tablet Take 1 tablet (50 mg total) by mouth 2 (two) times daily as needed.       08/14/2023    4:04 PM 07/21/2023    2:13 PM 07/07/2023    1:22 PM 02/18/2023    4:21 PM  GAD 7 : Generalized Anxiety Score  Nervous, Anxious, on Edge 0 0 0 0  Control/stop worrying 0 0 0 0  Worry too much - different things 0 0 0 0  Trouble relaxing 0 0 0 0  Restless 0 0 0 0  Easily annoyed or irritable 0 0 0 0  Afraid - awful might happen 0 0 0 0  Total GAD 7 Score 0 0 0 0  Anxiety Difficulty Not difficult at all Not difficult at all Not difficult at all Not difficult at all       08/14/2023    4:04 PM 07/21/2023    2:13 PM 07/07/2023    1:22 PM  Depression screen PHQ 2/9  Decreased Interest 0 0 0  Down, Depressed,  Hopeless 0 0 0  PHQ - 2 Score 0 0 0  Altered sleeping 0 0 0  Tired, decreased energy 1 0 0  Change in appetite 0 0 0  Feeling bad or failure about yourself  0 0 0  Trouble concentrating 0 0 0  Moving slowly or fidgety/restless 0 0 0  Suicidal thoughts 0 0 0  PHQ-9 Score 1 0 0  Difficult doing work/chores Not difficult at all Not difficult at all Not difficult at all    BP Readings from Last 3 Encounters:  11/17/23 124/70  08/26/23 110/72  08/18/23 (!) 126/97    Physical Exam Vitals and nursing note  reviewed. Exam conducted with a chaperone present.  Constitutional:      General: She is not in acute distress.    Appearance: She is not diaphoretic.  HENT:     Head: Normocephalic and atraumatic.     Right Ear: Tympanic membrane and external ear normal.     Left Ear: Tympanic membrane and external ear normal.     Nose: Nose normal. No congestion or rhinorrhea.     Mouth/Throat:     Mouth: Mucous membranes are moist.     Pharynx: No oropharyngeal exudate or posterior oropharyngeal erythema.  Eyes:     General:        Right eye: No discharge.        Left eye: No discharge.     Conjunctiva/sclera: Conjunctivae normal.     Pupils: Pupils are equal, round, and reactive to light.  Neck:     Thyroid: No thyromegaly.     Vascular: No JVD.  Cardiovascular:     Rate and Rhythm: Normal rate and regular rhythm.     Heart sounds: Normal heart sounds. No murmur heard.    No friction rub. No gallop.  Pulmonary:     Effort: Pulmonary effort is normal. No respiratory distress.     Breath sounds: Normal breath sounds. No stridor. No wheezing, rhonchi or rales.  Chest:     Chest wall: No tenderness.  Abdominal:     General: Bowel sounds are normal.     Palpations: Abdomen is soft. There is no mass.     Tenderness: There is no abdominal tenderness. There is no guarding or rebound.  Musculoskeletal:        General: Normal range of motion.     Cervical back: Normal range of motion and  neck supple.  Lymphadenopathy:     Cervical: No cervical adenopathy.  Skin:    General: Skin is warm and dry.  Neurological:     Mental Status: She is alert.     Deep Tendon Reflexes: Reflexes are normal and symmetric.     Wt Readings from Last 3 Encounters:  11/17/23 115 lb (52.2 kg)  08/26/23 116 lb 12.8 oz (53 kg)  08/18/23 121 lb 4.1 oz (55 kg)    BP 124/70   Pulse 78   Ht 5\' 6"  (1.676 m)   Wt 115 lb (52.2 kg)   SpO2 98%   BMI 18.56 kg/m   Assessment and Plan: 1. Pain of both hip joints (Primary) New onset.  Persistent.  Stable.  Patient started having pain within the last couple days but primarily is in the inguinal creases and when I palpate over the femoral head and neck of the femurs bilateral there is tenderness there is slight pain with internal/external rotation I believe this is probably more of arthritis we will obtain an x-ray of the hips and will likely refer to sports medicine. - meloxicam (MOBIC) 15 MG tablet; Take 1 tablet (15 mg total) by mouth daily.  Dispense: 30 tablet; Refill: 0 - DG Hip Unilat W OR W/O Pelvis 2-3 Views Left; Future - DG HIP UNILAT WITH PELVIS 2-3 VIEWS RIGHT; Future  2. Acute maxillary sinusitis, recurrence not specified New onset.  Persistent.  Stable.  Patient completed course of antibiotics but has not completely resolved and still has blood pressure sinus discharge.  There is tenderness over the maxillary and frontal sinuses.  We will treat with Augmentin 875 mg twice a day. - amoxicillin-clavulanate (AUGMENTIN) 875-125 MG tablet; Take 1  tablet by mouth 2 (two) times daily.  Dispense: 20 tablet; Refill: 0     Elizabeth Sauer, MD

## 2023-11-20 ENCOUNTER — Ambulatory Visit: Payer: Medicare Other | Admitting: Family Medicine

## 2023-11-22 ENCOUNTER — Encounter: Payer: Self-pay | Admitting: Family Medicine

## 2023-12-04 ENCOUNTER — Ambulatory Visit (INDEPENDENT_AMBULATORY_CARE_PROVIDER_SITE_OTHER): Payer: Medicare Other | Admitting: Family Medicine

## 2023-12-04 ENCOUNTER — Encounter: Payer: Self-pay | Admitting: Family Medicine

## 2023-12-04 VITALS — BP 98/58 | HR 75 | Ht 66.0 in | Wt 111.2 lb

## 2023-12-04 DIAGNOSIS — M160 Bilateral primary osteoarthritis of hip: Secondary | ICD-10-CM | POA: Diagnosis not present

## 2023-12-04 MED ORDER — DICLOFENAC SODIUM 50 MG PO TBEC
50.0000 mg | DELAYED_RELEASE_TABLET | Freq: Two times a day (BID) | ORAL | 0 refills | Status: DC | PRN
Start: 2023-12-04 — End: 2024-02-12

## 2023-12-04 MED ORDER — MELOXICAM 15 MG PO TABS
15.0000 mg | ORAL_TABLET | Freq: Every day | ORAL | 0 refills | Status: DC | PRN
Start: 2023-12-04 — End: 2024-07-05

## 2023-12-04 NOTE — Assessment & Plan Note (Signed)
 History of Present Illness Miss Mckillop presents with bilateral hip pain. She describes the pain as a constant ache in the groin area bilaterally, which occasionally radiates down to the mid-thigh. 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 She has been taking meloxicam  for her hip pain, which she reports takes about two hours to take effect and provides relief for most of the day. She has not noticed any specific activities that aggravate the pain, and it does not seem to be associated with any specific movements or positions.  Physical Exam MUSCULOSKELETAL: Negative/equivocal FADIR test bilaterally, negative Faber test bilaterally, nontender greater trochanteric regions bilaterally, nontender SI joints bilaterally, and resisted hip flexion does not elicit pain bilaterally.  Assessment and Plan Bilateral Hip Osteoarthritis Chronic hip pain, worse in the morning and with prolonged standing. X-rays show mild femoroacetabular arthritis bilaterally. Physical exam is reassuring. -Continue Meloxicam  as needed in the morning with food. -As symptoms improved, she can transition to Diclofenac  twice a day as needed with food for flexibility in dosing. -Recommend home exercises to strengthen hip muscles and improve joint mobility. -Consider use of anti-fatigue mats during prolonged standing activities. -If symptoms persist or worsen, consider cortisone injection.

## 2023-12-04 NOTE — Progress Notes (Signed)
 Primary Care / Sports Medicine Office Visit  Patient Information:  Patient ID: Marilyn Molina, female DOB: August 08, 1951 Age: 73 y.o. MRN: [id number]   Marilyn Molina is a pleasant 73 y.o. female presenting with the following:  Chief Complaint  Patient presents with   Hip Pain    Patient presents today for bil hip pain x 2-3 months. Patient states her hips have been achy pretty constant, but since she has taken the meloxicam  Dr. Joshua prescribed her it has calmed down some, she also uses heat to help with the pain.     Vitals:   12/04/23 1338  BP: (!) 98/58  Pulse: 75  SpO2: 96%   Vitals:   12/04/23 1338  Weight: 111 lb 3.2 oz (50.4 kg)  Height: 5' 6 (1.676 m)   Body mass index is 17.95 kg/m.     Independent interpretation of notes and tests performed by another provider:   Independent interpretation of bilateral hip x-rays dated 11/17/2023 demonstrate mild bilateral femoroacetabular osteoarthritis with inferior joint space narrowing, osteophytosis of the femoral head bilaterally, no acute osseous processes identified.  Procedures performed:   None  Pertinent History, Exam, Impression, and Recommendations:   Problem List Items Addressed This Visit       Musculoskeletal and Integument   Bilateral primary osteoarthritis of hip - Primary   History of Present Illness [name] presents with bilateral hip pain. She describes the pain as a constant ache in the groin area bilaterally, which occasionally radiates down to the mid-thigh. 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 She has been taking meloxicam  for her hip pain, which she reports takes about two hours to take effect and provides relief for most of the day. She has not noticed any specific activities that aggravate the pain, and it does not seem to be associated with any specific movements or positions.  Physical  Exam MUSCULOSKELETAL: Negative/equivocal FADIR test bilaterally, negative Faber test bilaterally, nontender greater trochanteric regions bilaterally, nontender SI joints bilaterally, and resisted hip flexion does not elicit pain bilaterally.  Assessment and Plan Bilateral Hip Osteoarthritis Chronic hip pain, worse in the morning and with prolonged standing. X-rays show mild femoroacetabular arthritis bilaterally. Physical exam is reassuring. -Continue Meloxicam  as needed in the morning with food. -As symptoms improved, she can transition to Diclofenac  twice a day as needed with food for flexibility in dosing. -Recommend home exercises to strengthen hip muscles and improve joint mobility. -Consider use of anti-fatigue mats during prolonged standing activities. -If symptoms persist or worsen, consider cortisone injection.      Relevant Medications   meloxicam  (MOBIC ) 15 MG tablet   diclofenac  (VOLTAREN ) 50 MG EC tablet     Orders & Medications Medications:  Meds ordered this encounter  Medications   meloxicam  (MOBIC ) 15 MG tablet    Sig: Take 1 tablet (15 mg total) by mouth daily as needed for pain.    Dispense:  30 tablet    Refill:  0   diclofenac  (VOLTAREN ) 50 MG EC tablet    Sig: Take 1 tablet (50 mg total) by mouth 2 (two) times daily as needed.    Dispense:  60 tablet    Refill:  0   No orders of the defined types were placed in this encounter.    No follow-ups on file.     Satoru Milich J Odell Fasching, MD, CAQSM   Primary Care  Sports Medicine Primary Care and Sports Medicine at [facility]

## 2023-12-04 NOTE — Patient Instructions (Signed)
 Patient Plan:  Bilateral Hip Osteoarthritis: - Continue meloxicam  as needed in the morning with food. - Once symptoms stabilize, can take diclofenac  up to twice daily as needed with food. - Only take meloxicam  OR diclofenac  - Perform home exercises to strengthen hip muscles and improve mobility. - Use anti-fatigue mats during prolonged standing. - Consider cortisone injection if symptoms persist or worsen. Contact us  for scheduling.

## 2023-12-17 ENCOUNTER — Ambulatory Visit: Payer: Self-pay | Admitting: *Deleted

## 2023-12-17 NOTE — Telephone Encounter (Signed)
  Chief Complaint: abdominal pain- upper, vomiting Symptoms: upper abdominal pain that comes and goes -TUM and Prilosec help, vomiting present Frequency: 2 weeks Pertinent Negatives: Patient denies back pain, diarrhea, fever, urination pain  Disposition: [] ED /[] Urgent Care (no appt availability in office) / [x] Appointment(In office/virtual)/ []  [place] [facility]/ [] Home Care/ [] Refused Recommended Disposition /[] [facility]/ []  Follow-up with PCP Additional Notes: Patient request to see PCP only- she has been schedule first available - same day slot Monday- patient advised call back if she gets worse- will need to see another provider

## 2023-12-17 NOTE — Telephone Encounter (Signed)
 Reason for Disposition  [1] Abdominal pain is intermittent AND [2] shoots into chest, with sour taste in mouth  Answer Assessment - Initial Assessment Questions 1. LOCATION: "Where does it hurt?"      Upper midline pain  3. ONSET: "When did the pain begin?" (e.g., minutes, hours or days ago)      Past 2 weeks everyday 4. SUDDEN: "Gradual or sudden onset?"     Early am, after eating 5. PATTERN "Does the pain come and go, or is it constant?"    - If it comes and goes: "How long does it last?" "Do you have pain now?"     (Note: Comes and goes means the pain is intermittent. It goes away completely between bouts.)    - If constant: "Is it getting better, staying the same, or getting worse?"      (Note: Constant means the pain never goes away completely; most serious pain is constant and gets worse.)      Comes and goes- Prilosec helps 6. SEVERITY: "How bad is the pain?"  (e.g., Scale 1-10; mild, moderate, or severe)    - MILD (1-3): Doesn't interfere with normal activities, abdomen soft and not tender to touch..     - MODERATE (4-7): Interferes with normal activities or awakens from sleep, abdomen tender to touch.     - SEVERE (8-10): Excruciating pain, doubled over, unable to do any normal activities.       8/10 7. RECURRENT SYMPTOM: "Have you ever had this type of stomach pain before?" If Yes, ask: "When was the last time?" and "What happened that time?"      no 8. AGGRAVATING FACTORS: "Does anything seem to cause this pain?" (e.g., foods, stress, alcohol)     Prilosec may help 9. CARDIAC SYMPTOMS: "Do you have any of the following symptoms: chest pain, difficulty breathing, sweating, nausea?"     no 10. OTHER SYMPTOMS: "Do you have any other symptoms?" (e.g., back pain, diarrhea, fever, urination pain, vomiting)       Vomiting- i10 out of 14 days vomit, does help pain  Protocols used: Abdominal Pain - Upper-A-AH

## 2023-12-19 ENCOUNTER — Encounter: Payer: Self-pay | Admitting: Family Medicine

## 2023-12-19 ENCOUNTER — Other Ambulatory Visit: Payer: Self-pay | Admitting: Family Medicine

## 2023-12-19 MED ORDER — ONDANSETRON HCL 4 MG PO TABS: 4.0000 mg | ORAL_TABLET | Freq: Three times a day (TID) | ORAL | 0 refills | Status: DC | PRN

## 2023-12-19 MED ORDER — PANTOPRAZOLE SODIUM 40 MG PO TBEC: 40.0000 mg | DELAYED_RELEASE_TABLET | Freq: Every day | ORAL | 3 refills | Status: DC

## 2023-12-22 ENCOUNTER — Ambulatory Visit (INDEPENDENT_AMBULATORY_CARE_PROVIDER_SITE_OTHER): Payer: Medicare Other | Admitting: Family Medicine

## 2023-12-22 ENCOUNTER — Encounter: Payer: Self-pay | Admitting: Family Medicine

## 2023-12-22 VITALS — BP 106/74 | HR 69 | Ht 66.0 in | Wt 114.0 lb

## 2023-12-22 DIAGNOSIS — K2900 Acute gastritis without bleeding: Secondary | ICD-10-CM | POA: Diagnosis not present

## 2023-12-22 NOTE — Progress Notes (Signed)
Date:  12/22/2023   Name:  Marilyn Molina   DOB:  27-Dec-1950   MRN:  [id number]   Chief Complaint: Abdominal Pain (Upper stomach pain x 2 weeks. ) and Emesis (Vomiting x 2 weeks not daily. )  Abdominal Pain This is a new problem. The current episode started in the past 7 days. The problem occurs intermittently. The problem has been waxing and waning. The pain is located in the epigastric region. The pain is mild. The quality of the pain is burning and aching. The abdominal pain does not radiate. Associated symptoms include constipation, nausea and vomiting. Pertinent negatives include no arthralgias, diarrhea, dysuria, fever, frequency, headaches, hematochezia, hematuria, melena or myalgias. Nothing aggravates the pain. The pain is relieved by Nothing. She has tried proton pump inhibitors and H2 blockers for the symptoms. The treatment provided mild relief. There is no history of abdominal surgery, colon cancer, irritable bowel syndrome or pancreatitis.  Emesis  This is a new problem. The current episode started yesterday. The problem occurs intermittently. The problem has been gradually improving. Associated symptoms include abdominal pain. Pertinent negatives include no arthralgias, chills, coughing, diarrhea, dizziness, fever, headaches or myalgias.    Lab Results  Component Value Date   NA 143 01/07/2022   K 5.0 01/07/2022   CO2 22 01/07/2022   GLUCOSE 93 01/07/2022   BUN 13 01/07/2022   CREATININE 0.89 01/07/2022   CALCIUM 9.5 01/07/2022   EGFR 70 01/07/2022   GFRNONAA 84 01/10/2021   Lab Results  Component Value Date   CHOL 152 01/07/2022   HDL 69 01/07/2022   LDLCALC 67 01/07/2022   TRIG 83 01/07/2022   CHOLHDL 2.9 10/08/2018   Lab Results  Component Value Date   TSH 1.430 01/10/2021   No results found for: "HGBA1C" Lab Results  Component Value Date   WBC 10.9 (H) 01/10/2021   HGB 13.7 01/10/2021   HCT 40.5 01/10/2021   MCV 96 01/10/2021   PLT 389  01/10/2021   Lab Results  Component Value Date   ALT 11 01/07/2022   AST 15 01/07/2022   ALKPHOS 87 01/07/2022   BILITOT <0.2 01/07/2022   No results found for: "25OHVITD2", "25OHVITD3", "VD25OH"   Review of Systems  Constitutional: Negative.  Negative for chills, fatigue, fever and unexpected weight change.  HENT:  Negative for congestion, ear discharge, ear pain, rhinorrhea, sinus pressure, sneezing and sore throat.   Respiratory:  Negative for cough, shortness of breath, wheezing and stridor.   Gastrointestinal:  Positive for abdominal pain, constipation, nausea and vomiting. Negative for blood in stool, diarrhea, hematochezia and melena.  Genitourinary:  Negative for dysuria, flank pain, frequency, hematuria, urgency and vaginal discharge.  Musculoskeletal:  Negative for arthralgias, back pain and myalgias.  Skin:  Negative for rash.  Neurological:  Negative for dizziness, weakness and headaches.  Hematological:  Negative for adenopathy. Does not bruise/bleed easily.  Psychiatric/Behavioral:  Negative for dysphoric mood. The patient is not nervous/anxious.     Patient Active Problem List   Diagnosis Date Noted   Bilateral primary osteoarthritis of hip 12/04/2023   Encounter for screening colonoscopy 08/18/2023   Polyp of sigmoid colon 08/18/2023   Primary osteoarthritis of right knee 09/10/2022   Centrilobular emphysema (HCC) 04/11/2022   Aortic atherosclerosis (HCC) 04/11/2022   Anxiety 11/21/2015   Essential hypertension 11/21/2015   Anxiety and depression 11/03/2012   Cardiomyopathy (HCC) 10/29/2011   Cigarette nicotine dependence without complication 10/29/2011   Tobacco abuse 10/29/2011  No Known Allergies  Past Surgical History:  Procedure Laterality Date   COLONOSCOPY WITH PROPOFOL N/A 08/18/2023   Procedure: COLONOSCOPY WITH PROPOFOL;  Surgeon: Midge Minium, MD;  Location[facility];  Service: Endoscopy;  Laterality: N/A;   OVARIAN CYST REMOVAL      POLYPECTOMY  08/18/2023   Procedure: POLYPECTOMY;  Surgeon: Midge Minium, MD;  Location: [facility];  Service: Endoscopy;;    Social History   Tobacco Use   Smoking status: Every Day    Current packs/day: 1.00    Average packs/day: 1 pack/day for 51.0 years (51.0 ttl pk-yrs)    Types: Cigarettes   Smokeless tobacco: Never   Tobacco comments:    0.5 PPD 08/26/2023 khj  Vaping Use   Vaping status: Never Used  Substance Use Topics   Alcohol use: No   Drug use: No     Medication list has been reviewed and updated.  Current Meds  Medication Sig   albuterol (VENTOLIN HFA) 108 (90 Base) MCG/ACT inhaler Inhale 2 puffs into the lungs every 6 (six) hours as needed for wheezing or shortness of breath.   atorvastatin (LIPITOR) 10 MG tablet Take 1 tablet (10 mg total) by mouth daily.   Calcium Carbonate-Vit D-Min (CALCIUM 1200 PO) Take 1 tablet by mouth daily.   carvedilol (COREG) 3.125 MG tablet Take 1 tablet (3.125 mg total) by mouth 2 (two) times daily with a meal. (Patient taking differently: Take 3.125 mg by mouth 2 (two) times daily with a meal. 2 pills BID/ cardio at [facility])   cholecalciferol (VITAMIN D3) 25 MCG (1000 UNIT) tablet Take 1,000 Units by mouth daily.   diclofenac (VOLTAREN) 50 MG EC tablet Take 1 tablet (50 mg total) by mouth 2 (two) times daily as needed.   ENTRESTO 24-26 MG Take 1 tablet by mouth 2 (two) times daily. [facility]   montelukast (SINGULAIR) 10 MG tablet Take 1 tablet (10 mg total) by mouth daily.   Multiple Vitamin (MULTI-VITAMIN) tablet Take 1 tablet by mouth daily.   ondansetron (ZOFRAN) 4 MG tablet Take 1 tablet (4 mg total) by mouth every 8 (eight) hours as needed for nausea or vomiting.   pantoprazole (PROTONIX) 40 MG tablet Take 1 tablet (40 mg total) by mouth daily.   sertraline (ZOLOFT) 100 MG tablet Take 1 tablet (100 mg total) by mouth daily.   umeclidinium-vilanterol (ANORO ELLIPTA) 62.5-25 MCG/ACT AEPB Inhale 1 puff into the lungs  daily.   vitamin B-12 (CYANOCOBALAMIN) 1000 MCG tablet Take 1,000 mcg by mouth daily.       12/22/2023    4:07 PM 08/14/2023    4:04 PM 07/21/2023    2:13 PM 07/07/2023    1:22 PM  GAD 7 : Generalized Anxiety Score  Nervous, Anxious, on Edge 1 0 0 0  Control/stop worrying 0 0 0 0  Worry too much - different things 0 0 0 0  Trouble relaxing 1 0 0 0  Restless 1 0 0 0  Easily annoyed or irritable 0 0 0 0  Afraid - awful might happen 0 0 0 0  Total GAD 7 Score 3 0 0 0  Anxiety Difficulty Not difficult at all Not difficult at all Not difficult at all Not difficult at all       12/22/2023    4:07 PM 08/14/2023    4:04 PM 07/21/2023    2:13 PM  Depression screen PHQ 2/9  Decreased Interest 0 0 0  Down, Depressed, Hopeless 1 0  0  PHQ - 2 Score 1 0 0  Altered sleeping 0 0 0  Tired, decreased energy 2 1 0  Change in appetite 2 0 0  Feeling bad or failure about yourself  0 0 0  Trouble concentrating 0 0 0  Moving slowly or fidgety/restless 0 0 0  Suicidal thoughts 0 0 0  PHQ-9 Score 5 1 0  Difficult doing work/chores Not difficult at all Not difficult at all Not difficult at all    BP Readings from Last 3 Encounters:  12/22/23 106/74  12/04/23 (!) 98/58  11/17/23 124/70    Physical Exam Vitals and nursing note reviewed.  Constitutional:      General: She is not in acute distress.    Appearance: She is not diaphoretic.  HENT:     Head: Normocephalic and atraumatic.     Right Ear: External ear normal.     Left Ear: External ear normal.     Nose: Nose normal.  Eyes:     General:        Right eye: No discharge.        Left eye: No discharge.     Conjunctiva/sclera: Conjunctivae normal.     Pupils: Pupils are equal, round, and reactive to light.  Neck:     Thyroid: No thyromegaly.     Vascular: No JVD.  Cardiovascular:     Rate and Rhythm: Normal rate and regular rhythm.     Heart sounds: Normal heart sounds. No murmur heard.    No friction rub. No gallop.  Pulmonary:      Effort: Pulmonary effort is normal.     Breath sounds: Normal breath sounds.  Abdominal:     General: Bowel sounds are normal.     Palpations: Abdomen is soft. There is no hepatomegaly, splenomegaly or mass.     Tenderness: There is no abdominal tenderness. There is no guarding.  Musculoskeletal:        General: Normal range of motion.     Cervical back: Normal range of motion and neck supple.  Lymphadenopathy:     Cervical: No cervical adenopathy.  Skin:    General: Skin is warm and dry.  Neurological:     Mental Status: She is alert.     Deep Tendon Reflexes: Reflexes are normal and symmetric.     Wt Readings from Last 3 Encounters:  12/22/23 114 lb (51.7 kg)  12/04/23 111 lb 3.2 oz (50.4 kg)  11/17/23 115 lb (52.2 kg)    BP 106/74   Pulse 69   Ht 5\' 6"  (1.676 m)   Wt 114 lb (51.7 kg)   SpO2 95%   BMI 18.40 kg/m   Assessment and Plan:  1. Acute gastritis without hemorrhage, unspecified gastritis type (Primary) New onset.  Patient had midepigastric burning pain in which we called in pantoprazole 40 mg to start at twice a day with an evening dose.  Patient is currently on a continue pantoprazole at twice a day for another 5 to 7 days and then will cut back to once a day and we will continue at current dosing with decreasing dose to see cessation of famotidine in 3 to 5 days.  If symptoms continue or if they were to recur our next step is to have it evaluated by GI and patient will contact.   Elizabeth Sauer, MD

## 2024-01-02 ENCOUNTER — Other Ambulatory Visit: Payer: Self-pay | Admitting: Family Medicine

## 2024-01-02 DIAGNOSIS — J4521 Mild intermittent asthma with (acute) exacerbation: Secondary | ICD-10-CM

## 2024-01-08 ENCOUNTER — Ambulatory Visit (INDEPENDENT_AMBULATORY_CARE_PROVIDER_SITE_OTHER): Payer: Medicare Other | Admitting: Family Medicine

## 2024-01-08 ENCOUNTER — Encounter: Payer: Self-pay | Admitting: Family Medicine

## 2024-01-08 VITALS — BP 116/64 | HR 76 | Ht 66.0 in | Wt 111.4 lb

## 2024-01-08 DIAGNOSIS — K219 Gastro-esophageal reflux disease without esophagitis: Secondary | ICD-10-CM

## 2024-01-08 DIAGNOSIS — F324 Major depressive disorder, single episode, in partial remission: Secondary | ICD-10-CM

## 2024-01-08 DIAGNOSIS — R110 Nausea: Secondary | ICD-10-CM

## 2024-01-08 MED ORDER — ONDANSETRON HCL 4 MG PO TABS
4.0000 mg | ORAL_TABLET | Freq: Three times a day (TID) | ORAL | 3 refills | Status: AC | PRN
Start: 2024-01-08 — End: ?

## 2024-01-08 MED ORDER — SERTRALINE HCL 100 MG PO TABS: 100.0000 mg | ORAL_TABLET | Freq: Every day | ORAL | 1 refills | Status: AC

## 2024-01-08 MED ORDER — PANTOPRAZOLE SODIUM 40 MG PO TBEC: 40.0000 mg | DELAYED_RELEASE_TABLET | Freq: Every day | ORAL | 11 refills | Status: AC

## 2024-01-08 NOTE — Progress Notes (Signed)
 Date:  01/08/2024   Name:  Marilyn Molina   DOB:  10/25/1951   MRN:  [id number]   Chief Complaint: Medical Management of Chronic Issues (Patient presents today for a 6 month follow up on her HTN, HLD, and depression. She is doing well and just wold like refills on her medications today.)  Depression        This is a chronic problem.  The current episode started more than 1 year ago.   The onset quality is sudden.   The problem occurs intermittently.  The problem has been gradually improving since onset.  Associated symptoms include decreased concentration, insomnia, decreased interest and sad.  Associated symptoms include no fatigue, no helplessness, no hopelessness, not irritable, no restlessness, no appetite change, no body aches, no myalgias, no headaches, no indigestion and no suicidal ideas.  Past treatments include SSRIs - Selective serotonin reuptake inhibitors.  Compliance with treatment is good.  Past compliance problems include difficulty understanding directions.  Previous treatment provided moderate relief. Gastroesophageal Reflux She reports no chest pain, no choking, no dysphagia, no early satiety, no water  brash or no wheezing. This is a chronic problem. The current episode started more than 1 year ago. The problem has been gradually improving. The symptoms are aggravated by certain foods. Pertinent negatives include no fatigue. She has tried a PPI for the symptoms. The treatment provided moderate relief.    Lab Results  Component Value Date   NA 143 01/07/2022   K 5.0 01/07/2022   CO2 22 01/07/2022   GLUCOSE 93 01/07/2022   BUN 13 01/07/2022   CREATININE 0.89 01/07/2022   CALCIUM  9.5 01/07/2022   EGFR 70 01/07/2022   GFRNONAA 84 01/10/2021   Lab Results  Component Value Date   CHOL 152 01/07/2022   HDL 69 01/07/2022   LDLCALC 67 01/07/2022   TRIG 83 01/07/2022   CHOLHDL 2.9 10/08/2018   Lab Results  Component Value Date   TSH 1.430 01/10/2021   No results  found for: HGBA1C Lab Results  Component Value Date   WBC 10.9 (H) 01/10/2021   HGB 13.7 01/10/2021   HCT 40.5 01/10/2021   MCV 96 01/10/2021   PLT 389 01/10/2021   Lab Results  Component Value Date   ALT 11 01/07/2022   AST 15 01/07/2022   ALKPHOS 87 01/07/2022   BILITOT <0.2 01/07/2022   No results found for: 25OHVITD2, 25OHVITD3, VD25OH   Review of Systems  Constitutional:  Negative for appetite change and fatigue.  Eyes:  Negative for visual disturbance.  Respiratory:  Negative for apnea, choking, shortness of breath and wheezing.   Cardiovascular:  Negative for chest pain, palpitations and leg swelling.  Gastrointestinal:  Negative for abdominal distention and dysphagia.  Musculoskeletal:  Negative for myalgias.  Neurological:  Negative for headaches.  Psychiatric/Behavioral:  Positive for decreased concentration and depression. Negative for suicidal ideas. The patient has insomnia.     Patient Active Problem List   Diagnosis Date Noted   Bilateral primary osteoarthritis of hip 12/04/2023   Encounter for screening colonoscopy 08/18/2023   Polyp of sigmoid colon 08/18/2023   Primary osteoarthritis of right knee 09/10/2022   Centrilobular emphysema (HCC) 04/11/2022   Aortic atherosclerosis (HCC) 04/11/2022   Anxiety 11/21/2015   Essential hypertension 11/21/2015   Anxiety and depression 11/03/2012   Cardiomyopathy (HCC) 10/29/2011   Cigarette nicotine dependence without complication 10/29/2011   Tobacco abuse 10/29/2011    No Known Allergies  Past Surgical History:  Procedure Laterality Date   COLONOSCOPY WITH PROPOFOL  N/A 08/18/2023   Procedure: COLONOSCOPY WITH PROPOFOL ;  Surgeon: Jinny Carmine, MD;  Location: [facility];  Service: Endoscopy;  Laterality: N/A;   OVARIAN CYST REMOVAL     POLYPECTOMY  08/18/2023   Procedure: POLYPECTOMY;  Surgeon: Jinny Carmine, MD;  Location: [facility];  Service: Endoscopy;;    Social History    Tobacco Use   Smoking status: Every Day    Current packs/day: 1.00    Average packs/day: 1 pack/day for 51.0 years (51.0 ttl pk-yrs)    Types: Cigarettes   Smokeless tobacco: Never   Tobacco comments:    0.5 PPD 08/26/2023 khj  Vaping Use   Vaping status: Never Used  Substance Use Topics   Alcohol use: No   Drug use: No     Medication list has been reviewed and updated.  Current Meds  Medication Sig   atorvastatin  (LIPITOR) 10 MG tablet Take 1 tablet (10 mg total) by mouth daily.   Calcium  Carbonate-Vit D-Min (CALCIUM  1200 PO) Take 1 tablet by mouth daily.   carvedilol  (COREG ) 3.125 MG tablet Take 1 tablet (3.125 mg total) by mouth 2 (two) times daily with a meal. (Patient taking differently: Take 3.125 mg by mouth 2 (two) times daily with a meal. 2 pills BID/ cardio at [facility])   cholecalciferol (VITAMIN D3) 25 MCG (1000 UNIT) tablet Take 1,000 Units by mouth daily.   diclofenac  (VOLTAREN ) 50 MG EC tablet Take 1 tablet (50 mg total) by mouth 2 (two) times daily as needed.   ENTRESTO 24-26 MG Take 1 tablet by mouth 2 (two) times daily. [facility]   montelukast  (SINGULAIR ) 10 MG tablet TAKE 1 TABLET BY MOUTH EVERY DAY   Multiple Vitamin (MULTI-VITAMIN) tablet Take 1 tablet by mouth daily.   ondansetron  (ZOFRAN ) 4 MG tablet Take 1 tablet (4 mg total) by mouth every 8 (eight) hours as needed for nausea or vomiting.   pantoprazole  (PROTONIX ) 40 MG tablet Take 1 tablet (40 mg total) by mouth daily.   sertraline  (ZOLOFT ) 100 MG tablet Take 1 tablet (100 mg total) by mouth daily.   vitamin B-12 (CYANOCOBALAMIN) 1000 MCG tablet Take 1,000 mcg by mouth daily.       01/08/2024    1:30 PM 12/22/2023    4:07 PM 08/14/2023    4:04 PM 07/21/2023    2:13 PM  GAD 7 : Generalized Anxiety Score  Nervous, Anxious, on Edge 1 1 0 0  Control/stop worrying 0 0 0 0  Worry too much - different things 0 0 0 0  Trouble relaxing 0 1 0 0  Restless 0 1 0 0  Easily annoyed or irritable 0 0 0 0   Afraid - awful might happen 0 0 0 0  Total GAD 7 Score 1 3 0 0  Anxiety Difficulty Not difficult at all Not difficult at all Not difficult at all Not difficult at all       01/08/2024    1:30 PM 12/22/2023    4:07 PM 08/14/2023    4:04 PM  Depression screen PHQ 2/9  Decreased Interest 1 0 0  Down, Depressed, Hopeless 1 1 0  PHQ - 2 Score 2 1 0  Altered sleeping 0 0 0  Tired, decreased energy 1 2 1   Change in appetite 0 2 0  Feeling bad or failure about yourself  0 0 0  Trouble concentrating 1 0 0  Moving slowly or fidgety/restless 0 0  0  Suicidal thoughts 0 0 0  PHQ-9 Score 4 5 1   Difficult doing work/chores  Not difficult at all Not difficult at all    BP Readings from Last 3 Encounters:  01/08/24 116/64  12/22/23 106/74  12/04/23 (!) 98/58    Physical Exam Vitals and nursing note reviewed.  Constitutional:      General: She is not irritable.She is not in acute distress.    Appearance: She is not diaphoretic.  HENT:     Head: Normocephalic and atraumatic.     Right Ear: Tympanic membrane and external ear normal. There is no impacted cerumen.     Left Ear: Tympanic membrane and external ear normal. There is no impacted cerumen.     Nose: Nose normal. No congestion or rhinorrhea.     Mouth/Throat:     Mouth: Mucous membranes are moist.     Pharynx: No oropharyngeal exudate or posterior oropharyngeal erythema.  Eyes:     General:        Right eye: No discharge.        Left eye: No discharge.     Conjunctiva/sclera: Conjunctivae normal.     Pupils: Pupils are equal, round, and reactive to light.  Neck:     Thyroid : No thyromegaly.     Vascular: No JVD.  Cardiovascular:     Rate and Rhythm: Normal rate and regular rhythm.     Heart sounds: Normal heart sounds. No murmur heard.    No friction rub. No gallop.  Pulmonary:     Effort: Pulmonary effort is normal.     Breath sounds: Normal breath sounds. No wheezing, rhonchi or rales.  Abdominal:     General: Bowel  sounds are normal.     Palpations: Abdomen is soft. There is no mass.     Tenderness: There is no abdominal tenderness. There is no guarding.  Musculoskeletal:        General: Normal range of motion.     Cervical back: Normal range of motion and neck supple.  Lymphadenopathy:     Cervical: No cervical adenopathy.  Skin:    General: Skin is warm and dry.  Neurological:     Mental Status: She is alert.     Deep Tendon Reflexes: Reflexes are normal and symmetric.     Wt Readings from Last 3 Encounters:  01/08/24 111 lb 6.4 oz (50.5 kg)  12/22/23 114 lb (51.7 kg)  12/04/23 111 lb 3.2 oz (50.4 kg)    BP 116/64   Pulse 76   Ht 5' 6 (1.676 m)   Wt 111 lb 6.4 oz (50.5 kg)   SpO2 100%   BMI 17.98 kg/m   Assessment and Plan:  1. Major depressive disorder in partial remission, unspecified whether recurrent (HCC) Chronic.  Controlled.  Stable.  PHQ is 4.  GAD score is 1.  Continue sertraline  100 mg once a day.  Patient feels significantly better with the elevation to this level.  Patient will continue sertraline  100 mg once a day and we will recheck patient in 6 months.  Patient is also going to discontinue montelukast . - sertraline  (ZOLOFT ) 100 MG tablet; Take 1 tablet (100 mg total) by mouth daily.  Dispense: 90 tablet; Refill: 1  2. Nausea (Primary) Chronic.  Controlled.  Stable.  Patient would like a refill on hand for Zofran  4 mg as needed.  Nausea. - ondansetron  (ZOFRAN ) 4 MG tablet; Take 1 tablet (4 mg total) by mouth every 8 (eight) hours  as needed for nausea or vomiting.  Dispense: 20 tablet; Refill: 3  3. Gastroesophageal reflux disease without esophagitis Chronic.  Controlled.  Stable.  Patient has total control of her reflux with pantoprazole  40 mg once a day.  Will continue at current dosing. - pantoprazole  (PROTONIX ) 40 MG tablet; Take 1 tablet (40 mg total) by mouth daily.  Dispense: 30 tablet; Refill: 11    Cathryne Molt, MD

## 2024-01-13 ENCOUNTER — Other Ambulatory Visit: Payer: Self-pay | Admitting: Family Medicine

## 2024-01-13 DIAGNOSIS — M160 Bilateral primary osteoarthritis of hip: Secondary | ICD-10-CM

## 2024-01-13 NOTE — Telephone Encounter (Signed)
Requested medication (s) are due for refill today: yes  Requested medication (s) are on the active medication list: yes  Last refill:  12/04/23  Future visit scheduled: no  Notes to clinic:  Unable to refill per protocol due to failed labs, no updated results.      Requested Prescriptions  Pending Prescriptions Disp Refills   meloxicam (MOBIC) 15 MG tablet [Pharmacy Med Name: MELOXICAM 15 MG TABLET] 30 tablet 0    Sig: TAKE 1 TABLET BY MOUTH EVERY DAY AS NEEDED FOR PAIN     Analgesics:  COX2 Inhibitors Failed - 01/13/2024  4:11 PM      Failed - Manual Review: Labs are only required if the patient has taken medication for more than 8 weeks.      Failed - HGB in normal range and within 360 days    Hemoglobin  Date Value Ref Range Status  01/10/2021 13.7 11.1 - 15.9 g/dL Final         Failed - Cr in normal range and within 360 days    Creatinine, Ser  Date Value Ref Range Status  01/07/2022 0.89 0.57 - 1.00 mg/dL Final         Failed - HCT in normal range and within 360 days    Hematocrit  Date Value Ref Range Status  01/10/2021 40.5 34.0 - 46.6 % Final         Failed - AST in normal range and within 360 days    AST  Date Value Ref Range Status  01/07/2022 15 0 - 40 IU/L Final         Failed - ALT in normal range and within 360 days    ALT  Date Value Ref Range Status  01/07/2022 11 0 - 32 IU/L Final         Failed - eGFR is 30 or above and within 360 days    GFR calc Af Amer  Date Value Ref Range Status  01/10/2021 96 >59 mL/min/1.73 Final    Comment:    **In accordance with recommendations from the NKF-ASN Task force,**   [facility] is in the process of updating its eGFR calculation to the   2021 CKD-EPI creatinine equation that estimates kidney function   without a race variable.    GFR calc non Af Amer  Date Value Ref Range Status  01/10/2021 84 >59 mL/min/1.73 Final   eGFR  Date Value Ref Range Status  01/07/2022 70 >59 mL/min/1.73 Final          Passed - Patient is not pregnant      Passed - Valid encounter within last 12 months    Recent Outpatient Visits           3 weeks ago Acute gastritis without hemorrhage, unspecified gastritis type   [place] [facility] In[facility], MD   1 month ago Bilateral primary osteoarthritis of hip   [place] [facility], Ocie Bob, MD   1 month ago Pain of both hip joints   [place] [facility] Inches, MD   5 months ago Acute non-recurrent maxillary sinusitis   [facility] at [facility], Melton Alar, [place]   5 months ago Weight loss   [facility]ineas Inches, MD

## 2024-01-16 NOTE — Telephone Encounter (Signed)
Please review thank you. JM

## 2024-02-10 ENCOUNTER — Other Ambulatory Visit: Payer: Self-pay | Admitting: Family Medicine

## 2024-02-10 DIAGNOSIS — M160 Bilateral primary osteoarthritis of hip: Secondary | ICD-10-CM

## 2024-02-10 NOTE — Telephone Encounter (Signed)
 Requested medications are due for refill today.  yes  Requested medications are on the active medications list.  yes  Last refill. 12/04/2023 #60 0 rf  Future visit scheduled.   no  Notes to clinic.  New medication to this pt. Please review for refill.    Requested Prescriptions  Pending Prescriptions Disp Refills   diclofenac (VOLTAREN) 50 MG EC tablet [Pharmacy Med Name: DICLOFENAC SOD EC 50 MG TAB] 60 tablet 0    Sig: TAKE 1 TABLET BY MOUTH 2 TIMES DAILY AS NEEDED.     Analgesics:  NSAIDS Failed - 02/10/2024  4:54 PM      Failed - Manual Review: Labs are only required if the patient has taken medication for more than 8 weeks.      Failed - Cr in normal range and within 360 days    Creatinine, Ser  Date Value Ref Range Status  01/07/2022 0.89 0.57 - 1.00 mg/dL Final         Failed - HGB in normal range and within 360 days    Hemoglobin  Date Value Ref Range Status  01/10/2021 13.7 11.1 - 15.9 g/dL Final         Failed - PLT in normal range and within 360 days    Platelets  Date Value Ref Range Status  01/10/2021 389 150 - 450 x10E3/uL Final         Failed - HCT in normal range and within 360 days    Hematocrit  Date Value Ref Range Status  01/10/2021 40.5 34.0 - 46.6 % Final         Failed - eGFR is 30 or above and within 360 days    GFR calc Af Amer  Date Value Ref Range Status  01/10/2021 96 >59 mL/min/1.73 Final    Comment:    **In accordance with recommendations from the NKF-ASN Task force,**   [facility] is in the process of updating its eGFR calculation to the   2021 CKD-EPI creatinine equation that estimates kidney function   without a race variable.    GFR calc non Af Amer  Date Value Ref Range Status  01/10/2021 84 >59 mL/min/1.73 Final   eGFR  Date Value Ref Range Status  01/07/2022 70 >59 mL/min/1.73 Final         Passed - Patient is not pregnant      Passed - Valid encounter within last 12 months    Recent Outpatient Visits           1  month ago Acute gastritis without hemorrhage, unspecified gastritis type   [facility], [facility]   2 months ago Bilateral primary osteoarthritis of hip   [facility], Ocie Bob, MD   2 months ago Pain of both hip joints   [facility], MD   6 months ago Acute non-recurrent maxillary sinusitis   [facility], Melton Alar, PA   6 months ago Weight loss   [facility], MD

## 2024-02-11 NOTE — Telephone Encounter (Signed)
 Please review.  KP

## 2024-03-16 ENCOUNTER — Other Ambulatory Visit: Payer: Self-pay | Admitting: Family Medicine

## 2024-03-16 DIAGNOSIS — M160 Bilateral primary osteoarthritis of hip: Secondary | ICD-10-CM

## 2024-03-16 NOTE — Telephone Encounter (Signed)
 Requested medications are due for refill today.  yes  Requested medications are on the active medications list.  yes  Last refill. 02/12/2024 #60 0 rf  Future visit scheduled.   no  Notes to clinic.  Labs are expired. Please review for refill.    Requested Prescriptions  Pending Prescriptions Disp Refills   diclofenac (VOLTAREN) 50 MG EC tablet [Pharmacy Med Name: DICLOFENAC SOD EC 50 MG TAB] 60 tablet 0    Sig: TAKE 1 TABLET BY MOUTH TWICE A DAY AS NEEDED     Analgesics:  NSAIDS Failed - 03/16/2024  4:39 PM      Failed - Manual Review: Labs are only required if the patient has taken medication for more than 8 weeks.      Failed - Cr in normal range and within 360 days    Creatinine, Ser  Date Value Ref Range Status  01/07/2022 0.89 0.57 - 1.00 mg/dL Final         Failed - HGB in normal range and within 360 days    Hemoglobin  Date Value Ref Range Status  01/10/2021 13.7 11.1 - 15.9 g/dL Final         Failed - PLT in normal range and within 360 days    Platelets  Date Value Ref Range Status  01/10/2021 389 150 - 450 x10E3/uL Final         Failed - HCT in normal range and within 360 days    Hematocrit  Date Value Ref Range Status  01/10/2021 40.5 34.0 - 46.6 % Final         Failed - eGFR is 30 or above and within 360 days    GFR calc Af Amer  Date Value Ref Range Status  01/10/2021 96 >59 mL/min/1.73 Final    Comment:    **In accordance with recommendations from the NKF-ASN Task force,**   [facility] is in the process of updating its eGFR calculation to the   2021 CKD-EPI creatinine equation that estimates kidney function   without a race variable.    GFR calc non Af Amer  Date Value Ref Range Status  01/10/2021 84 >59 mL/min/1.73 Final   eGFR  Date Value Ref Range Status  01/07/2022 70 >59 mL/min/1.73 Final         Passed - Patient is not pregnant      Passed - Valid encounter within last 12 months    Recent Outpatient Visits           2 months ago  Nausea   [facility], MD

## 2024-03-18 NOTE — Telephone Encounter (Signed)
 Please review.  KP

## 2024-04-06 IMAGING — CT CT CHEST LUNG CANCER SCREENING LOW DOSE W/O CM
2 of 5 series · 15 of 40 positions shown, 18 images · non-contrast
Comparison: Chest CT dated November 03, 2019

CLINICAL DATA: Current smoker with 70 pack-year history



[Series 3: lung 1.00 · axial · 0.66mm/px · z∈[-1217,-932]mm · 12 of 317 slices shown, 15 images]
[im 16/317  mediastinal]
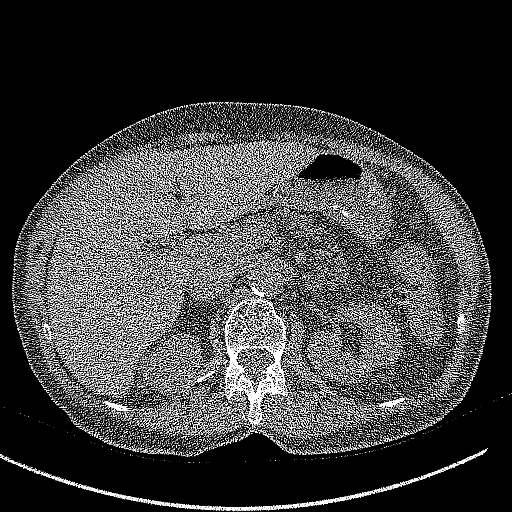
[im 16/317  lung]
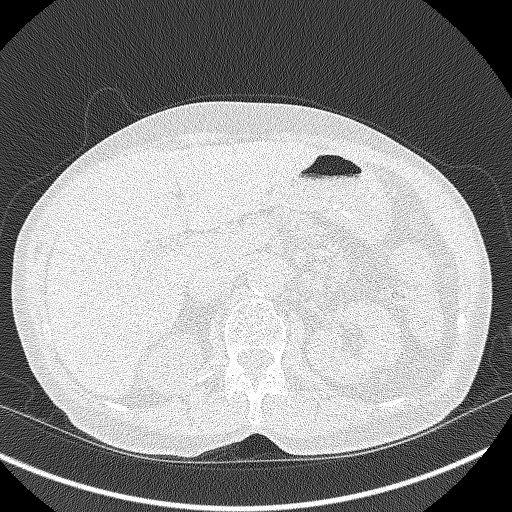
[im 46/317  lung]
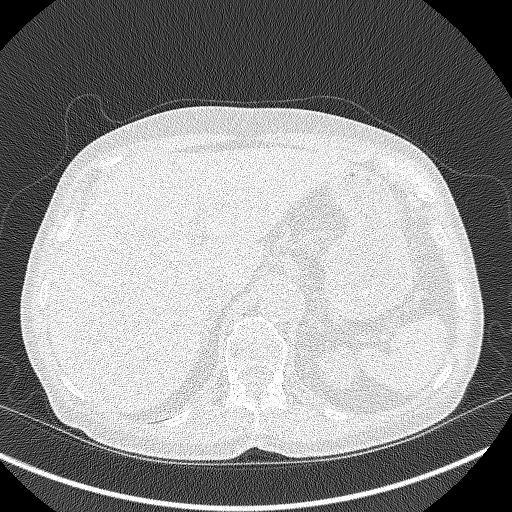
[im 76/317  lung]
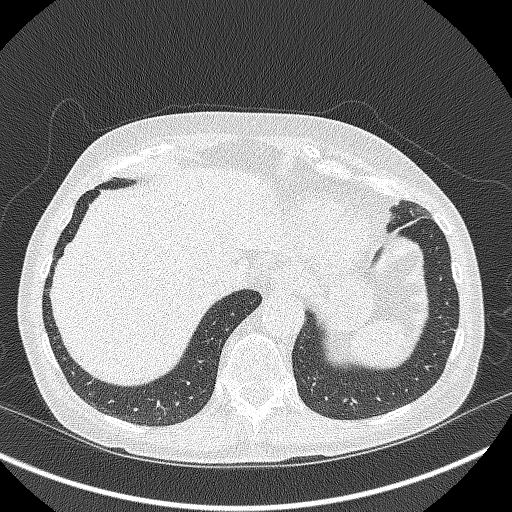
[im 91/317  lung]
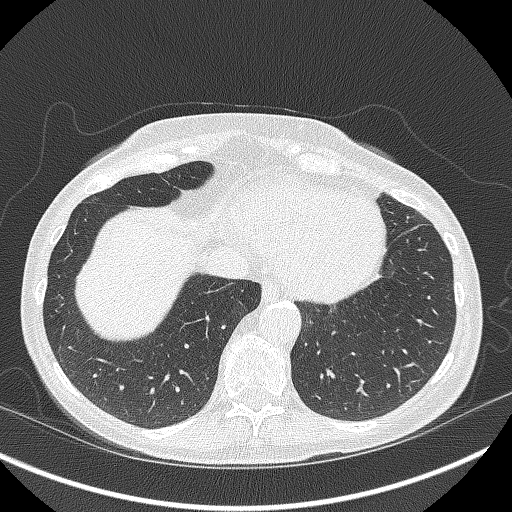
[im 121/317  mediastinal]
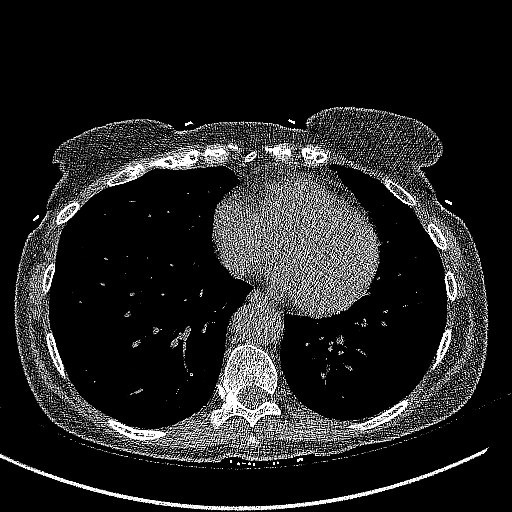
[im 121/317  lung]
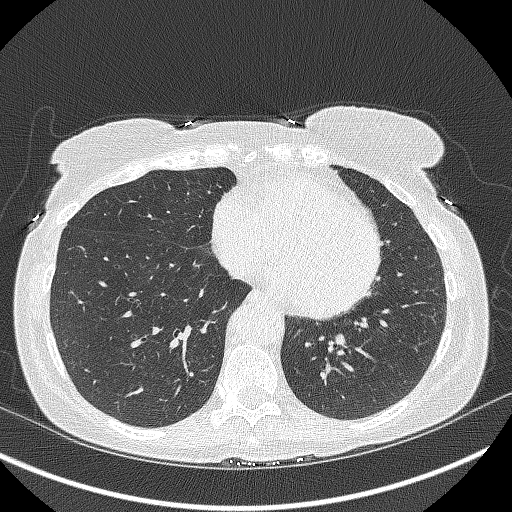
[im 151/317  lung]
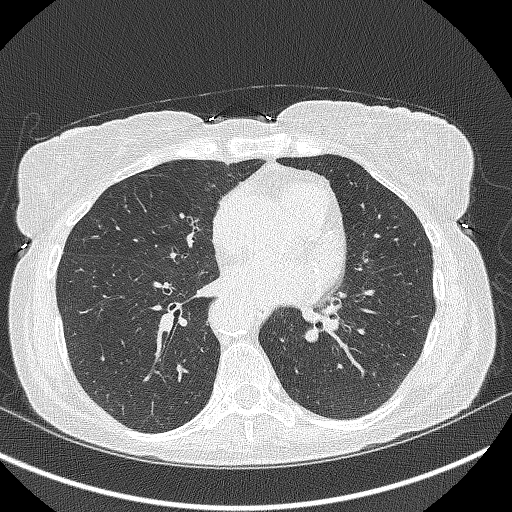
[im 166/317  lung]
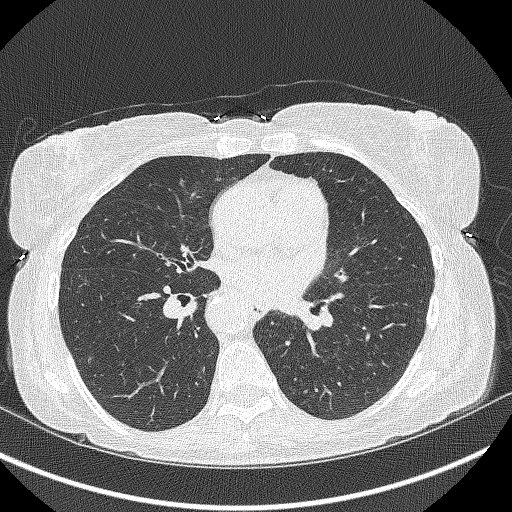
[im 196/317  lung]
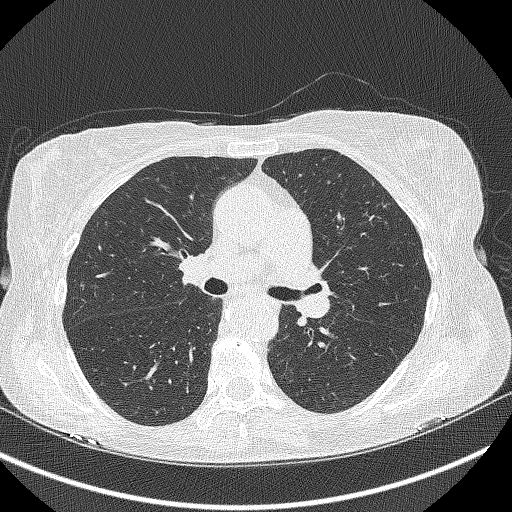
[im 226/317  mediastinal]
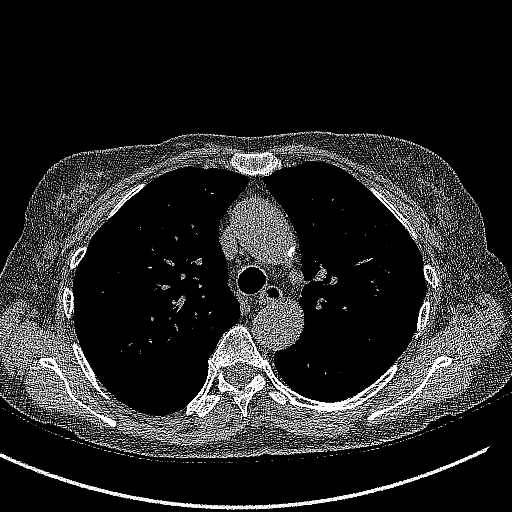
[im 226/317  lung]
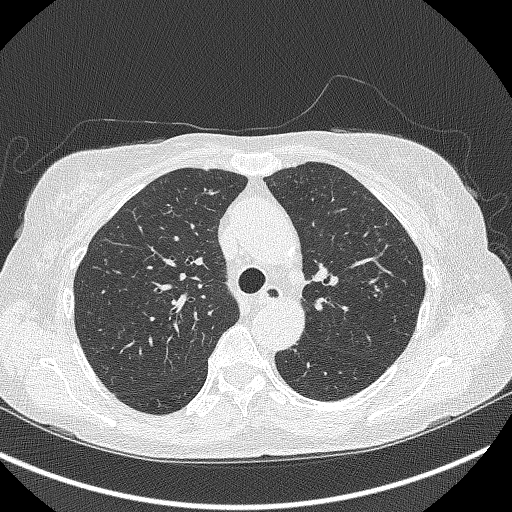
[im 241/317  lung]
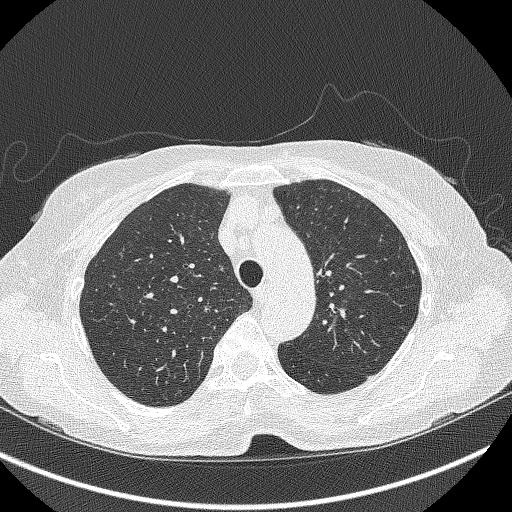
[im 271/317  lung]
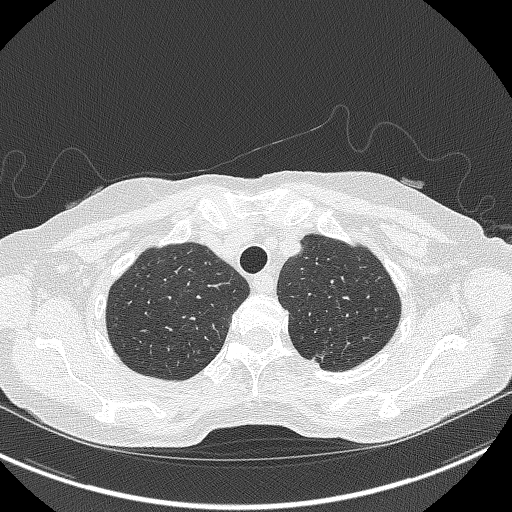
[im 301/317  lung]
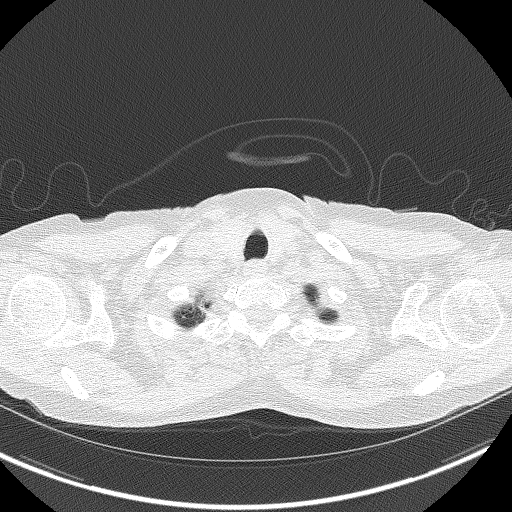

[Series 5: coronals lung 1.00 cor · coronal · 0.62mm/px · 3 of 292 slices shown]
[im 59/292  lung]
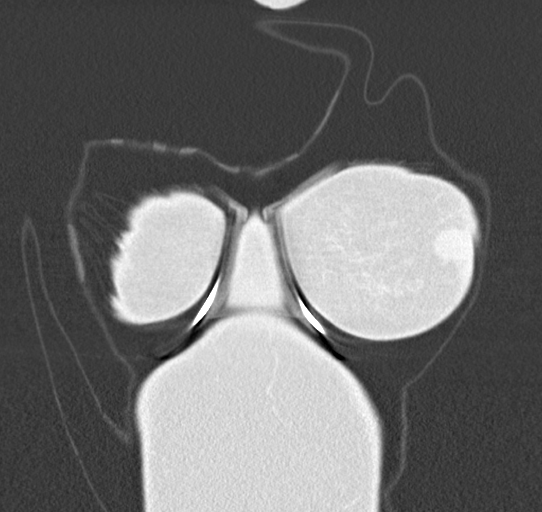
[im 117/292  lung]
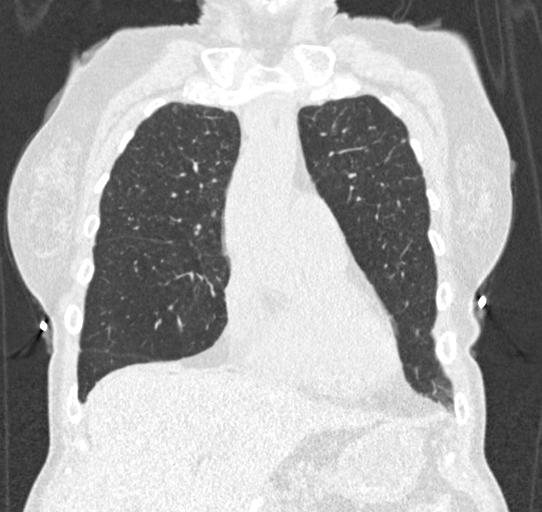
[im 175/292  lung]
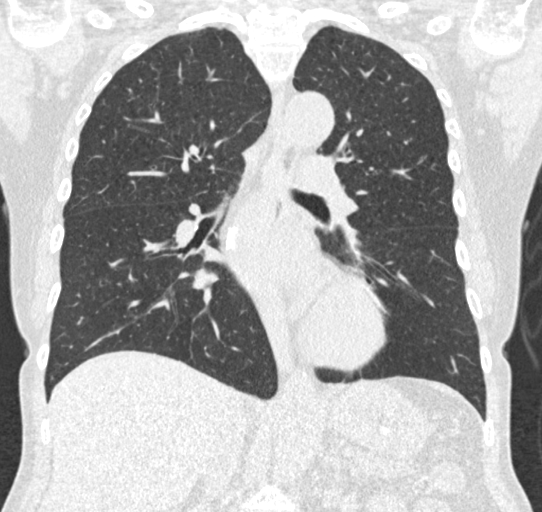

[15 of 40 positions shown; findings below may reference images not displayed]

FINDINGS: Cardiovascular: Normal heart size. No pericardial effusion. Coronary
artery calcifications of the LAD and circumflex.

Mediastinum/Nodes: Esophagus and thyroid are unremarkable. No
pathologically enlarged lymph nodes seen in the chest.

Lungs/Pleura: Central airways are patent. Centrilobular emphysema.
No consolidation, pleural effusion or pneumothorax.

Upper Abdomen: Peripherally calcified cystic lesion of the left
kidney measuring up to 2.5 cm, is unchanged in size but demonstrates
a internal septation which was not seen on prior exam. No acute
abnormality. New solid right upper lobe nodule measuring 7.2 mm in
mean diameter on image 89. Additional previously seen bilateral
solid pulmonary nodules are stable.

Musculoskeletal: No chest wall mass or suspicious bone lesions
identified.
IMPRESSION: 1. New solid nodule of the right upper lobe with largest nodule
measuring 7.2 mm. Lung-RADS 4AS, suspicious. Follow up low-dose
chest CT without contrast in 3 months (please use the following
order, "CT CHEST LCS NODULE FOLLOW-UP W/O CM") is recommended.
Alternatively, PET may be considered when there is a solid component
8mm or larger. S modifier for left renal lesion.
2. Peripherally calcified cystic lesion of the left kidney, is
unchanged in size but demonstrates a internal septation which was
not seen on prior exam. Recommend renal protocol CT or MRI for
further evaluation.
3.  Aortic Atherosclerosis ([id number]).

## 2024-04-26 ENCOUNTER — Other Ambulatory Visit: Payer: Self-pay | Admitting: Family Medicine

## 2024-04-26 DIAGNOSIS — M160 Bilateral primary osteoarthritis of hip: Secondary | ICD-10-CM

## 2024-04-28 NOTE — Telephone Encounter (Signed)
 Requested medication (s) are due for refill today: yes  Requested medication (s) are on the active medication list: yes  Last refill:  03/19/24 #60  Future visit scheduled: no  Notes to clinic:  overdue lab work (pt has been on med for > 8 weeks   Requested Prescriptions  Pending Prescriptions Disp Refills   diclofenac  (VOLTAREN ) 50 MG EC tablet [Pharmacy Med Name: DICLOFENAC  SOD EC 50 MG TAB] 60 tablet 0    Sig: TAKE 1 TABLET BY MOUTH TWICE A DAY AS NEEDED     Analgesics:  NSAIDS Failed - 04/28/2024  4:20 PM      Failed - Manual Review: Labs are only required if the patient has taken medication for more than 8 weeks.      Failed - Cr in normal range and within 360 days    Creatinine, Ser  Date Value Ref Range Status  01/07/2022 0.89 0.57 - 1.00 mg/dL Final         Failed - HGB in normal range and within 360 days    Hemoglobin  Date Value Ref Range Status  01/10/2021 13.7 11.1 - 15.9 g/dL Final         Failed - PLT in normal range and within 360 days    Platelets  Date Value Ref Range Status  01/10/2021 389 150 - 450 x10E3/uL Final         Failed - HCT in normal range and within 360 days    Hematocrit  Date Value Ref Range Status  01/10/2021 40.5 34.0 - 46.6 % Final         Failed - eGFR is 30 or above and within 360 days    GFR calc Af Amer  Date Value Ref Range Status  01/10/2021 96 >59 mL/min/1.73 Final    Comment:    **In accordance with recommendations from the NKF-ASN Task force,**   [facility] is in the process of updating its eGFR calculation to the   2021 CKD-EPI creatinine equation that estimates kidney function   without a race variable.    GFR calc non Af Amer  Date Value Ref Range Status  01/10/2021 84 >59 mL/min/1.73 Final   eGFR  Date Value Ref Range Status  01/07/2022 70 >59 mL/min/1.73 Final         Passed - Patient is not pregnant      Passed - Valid encounter within last 12 months    Recent Outpatient Visits           3 months ago  Nausea   [place] [place], MD

## 2024-04-29 ENCOUNTER — Ambulatory Visit (INDEPENDENT_AMBULATORY_CARE_PROVIDER_SITE_OTHER): Admitting: Family Medicine

## 2024-04-29 ENCOUNTER — Encounter: Payer: Self-pay | Admitting: Family Medicine

## 2024-04-29 VITALS — BP 102/64 | HR 79 | Temp 97.9°F | Ht 66.0 in | Wt 111.1 lb

## 2024-04-29 DIAGNOSIS — J0100 Acute maxillary sinusitis, unspecified: Secondary | ICD-10-CM

## 2024-04-29 MED ORDER — AMOXICILLIN-POT CLAVULANATE 875-125 MG PO TABS: 1.0000 | ORAL_TABLET | Freq: Two times a day (BID) | ORAL | 1 refills | Status: DC

## 2024-04-29 NOTE — Progress Notes (Signed)
 Date:  04/29/2024   Name:  Marilyn Molina   DOB:  [birth year]   MRN:  [id number]   Chief Complaint: Sinusitis (Patient said since Saturday, no appetite, nasal congested, vomited 4 - 5 times since Saturday) and Headache  Sinusitis This is a new problem. The current episode started in the past 7 days. The problem has been gradually improving since onset. There has been no fever. The pain is mild. Pertinent negatives include no chills, congestion, coughing, diaphoresis, ear pain, headaches, hoarse voice, neck pain, shortness of breath, sinus pressure, sneezing, sore throat or swollen glands. Past treatments include nothing. The treatment provided moderate relief.    Lab Results  Component Value Date   NA 143 01/07/2022   K 5.0 01/07/2022   CO2 22 01/07/2022   GLUCOSE 93 01/07/2022   BUN 13 01/07/2022   CREATININE 0.89 01/07/2022   CALCIUM  9.5 01/07/2022   EGFR 70 01/07/2022   GFRNONAA 84 01/10/2021   Lab Results  Component Value Date   CHOL 152 01/07/2022   HDL 69 01/07/2022   LDLCALC 67 01/07/2022   TRIG 83 01/07/2022   CHOLHDL 2.9 10/08/2018   Lab Results  Component Value Date   TSH 1.430 01/10/2021   No results found for: "HGBA1C" Lab Results  Component Value Date   WBC 10.9 (H) 01/10/2021   HGB 13.7 01/10/2021   HCT 40.5 01/10/2021   MCV 96 01/10/2021   PLT 389 01/10/2021   Lab Results  Component Value Date   ALT 11 01/07/2022   AST 15 01/07/2022   ALKPHOS 87 01/07/2022   BILITOT <0.2 01/07/2022   No results found for: "25OHVITD2", "25OHVITD3", "VD25OH"   Review of Systems  Constitutional:  Negative for chills and diaphoresis.  HENT:  Negative for congestion, ear pain, hoarse voice, postnasal drip, rhinorrhea, sinus pressure, sneezing and sore throat.   Respiratory:  Negative for cough, chest tightness and shortness of breath.   Cardiovascular:  Negative for chest pain and palpitations.  Musculoskeletal:  Negative for neck pain.  Neurological:   Negative for headaches.    Patient Active Problem List   Diagnosis Date Noted   Bilateral primary osteoarthritis of hip 12/04/2023   Encounter for screening colonoscopy 08/18/2023   Polyp of sigmoid colon 08/18/2023   Primary osteoarthritis of right knee 09/10/2022   Centrilobular emphysema (HCC) 04/11/2022   Aortic atherosclerosis (HCC) 04/11/2022   Anxiety 11/21/2015   Essential hypertension 11/21/2015   Anxiety and depression 11/03/2012   Cardiomyopathy (HCC) 10/29/2011   Cigarette nicotine dependence without complication 10/29/2011   Tobacco abuse 10/29/2011    No Known Allergies  Past Surgical History:  Procedure Laterality Date   COLONOSCOPY WITH PROPOFOL  N/A 08/18/2023   Procedure: COLONOSCOPY WITH PROPOFOL ;  Surgeon: Marnee Sink, MD;  [facility];  Service: Endoscopy;  Laterality: N/A;   OVARIAN CYST REMOVAL     POLYPECTOMY  08/18/2023   Procedure: POLYPECTOMY;  Surgeon: Marnee Sink, MD;  Location: [facility];  Service: Endoscopy;;    Social History   Tobacco Use   Smoking status: Every Day    Current packs/day: 1.00    Average packs/day: 1 pack/day for 51.0 years (51.0 ttl pk-yrs)    Types: Cigarettes   Smokeless tobacco: Never   Tobacco comments:    0.5 PPD 08/26/2023 khj  Vaping Use   Vaping status: Never Used  Substance Use Topics   Alcohol use: No   Drug use: No     Medication  list has been reviewed and updated.  Current Meds  Medication Sig   albuterol  (VENTOLIN  HFA) 108 (90 Base) MCG/ACT inhaler Inhale 2 puffs into the lungs every 6 (six) hours as needed for wheezing or shortness of breath.   atorvastatin  (LIPITOR) 10 MG tablet Take 1 tablet (10 mg total) by mouth daily.   Calcium  Carbonate-Vit D-Min (CALCIUM  1200 PO) Take 1 tablet by mouth daily.   carvedilol  (COREG ) 3.125 MG tablet Take 1 tablet (3.125 mg total) by mouth 2 (two) times daily with a meal. (Patient taking differently: Take 3.125 mg by mouth 2 (two) times  daily with a meal. 2 pills BID/ cardio at [facility])   cholecalciferol (VITAMIN D3) 25 MCG (1000 UNIT) tablet Take 1,000 Units by mouth daily.   diclofenac  (VOLTAREN ) 50 MG EC tablet TAKE 1 TABLET BY MOUTH TWICE A DAY AS NEEDED   ENTRESTO 24-26 MG Take 1 tablet by mouth 2 (two) times daily. [facility]   Multiple Vitamin (MULTI-VITAMIN) tablet Take 1 tablet by mouth daily.   pantoprazole  (PROTONIX ) 40 MG tablet Take 1 tablet (40 mg total) by mouth daily.   sertraline  (ZOLOFT ) 100 MG tablet Take 1 tablet (100 mg total) by mouth daily.   vitamin B-12 (CYANOCOBALAMIN) 1000 MCG tablet Take 1,000 mcg by mouth daily.       04/29/2024   11:10 AM 01/08/2024    1:30 PM 12/22/2023    4:07 PM 08/14/2023    4:04 PM  GAD 7 : Generalized Anxiety Score  Nervous, Anxious, on Edge 0 1 1 0  Control/stop worrying 0 0 0 0  Worry too much - different things  0 0 0  Trouble relaxing  0 1 0  Restless  0 1 0  Easily annoyed or irritable  0 0 0  Afraid - awful might happen  0 0 0  Total GAD 7 Score  1 3 0  Anxiety Difficulty  Not difficult at all Not difficult at all Not difficult at all       04/29/2024   11:10 AM 01/08/2024    1:30 PM 12/22/2023    4:07 PM  Depression screen PHQ 2/9  Decreased Interest 0 1 0  Down, Depressed, Hopeless 0 1 1  PHQ - 2 Score 0 2 1  Altered sleeping  0 0  Tired, decreased energy  1 2  Change in appetite  0 2  Feeling bad or failure about yourself   0 0  Trouble concentrating  1 0  Moving slowly or fidgety/restless  0 0  Suicidal thoughts  0 0  PHQ-9 Score  4 5  Difficult doing work/chores   Not difficult at all    BP Readings from Last 3 Encounters:  04/29/24 102/64  01/08/24 116/64  12/22/23 106/74    Physical Exam Vitals and nursing note reviewed.  Constitutional:      General: She is not in acute distress.    Appearance: She is not diaphoretic.  HENT:     Head: Normocephalic and atraumatic.     Right Ear: External ear normal.     Left Ear: External ear  normal.     Nose: Nose normal.  Eyes:     General:        Right eye: No discharge.        Left eye: No discharge.     Conjunctiva/sclera: Conjunctivae normal.     Pupils: Pupils are equal, round, and reactive to light.  Neck:     Thyroid :  No thyromegaly.     Vascular: No JVD.  Cardiovascular:     Rate and Rhythm: Normal rate and regular rhythm.     Heart sounds: Normal heart sounds. No murmur heard.    No friction rub. No gallop.  Pulmonary:     Effort: Pulmonary effort is normal.     Breath sounds: Normal breath sounds.  Abdominal:     General: Bowel sounds are normal.     Palpations: Abdomen is soft. There is no mass.     Tenderness: There is no abdominal tenderness. There is no guarding.  Musculoskeletal:        General: Normal range of motion.     Cervical back: Normal range of motion and neck supple.  Lymphadenopathy:     Cervical: No cervical adenopathy.  Skin:    General: Skin is warm and dry.  Neurological:     Mental Status: She is alert.     Deep Tendon Reflexes: Reflexes are normal and symmetric.     Wt Readings from Last 3 Encounters:  04/29/24 111 lb 2 oz (50.4 kg)  01/08/24 111 lb 6.4 oz (50.5 kg)  12/22/23 114 lb (51.7 kg)    BP 102/64   Pulse 79   Temp 97.9 F (36.6 C)   Ht 5\' 6"  (1.676 m)   Wt 111 lb 2 oz (50.4 kg)   SpO2 97%   BMI 17.94 kg/m   Assessment and Plan:  1. Acute maxillary sinusitis, recurrence not specified (Primary) Acute.  Recurrent.  Symptomatic with purulent nasal drainage postnasal drainage and cough.  Patient has pressure in the maxillary area with mild tenderness on palpation and percussion over the maxillary sinuses.  This is consistent with an acute maxillary sinusitis and we will treat with Augmentin  875 mg twice a day for 10 days.   Alayne Allis, MD

## 2024-04-29 NOTE — Telephone Encounter (Signed)
 Please review request

## 2024-05-27 ENCOUNTER — Other Ambulatory Visit: Payer: Self-pay | Admitting: Family Medicine

## 2024-05-27 DIAGNOSIS — M160 Bilateral primary osteoarthritis of hip: Secondary | ICD-10-CM

## 2024-07-05 ENCOUNTER — Telehealth: Payer: Self-pay | Admitting: Family Medicine

## 2024-07-05 ENCOUNTER — Encounter: Payer: Self-pay | Admitting: Family Medicine

## 2024-07-05 ENCOUNTER — Ambulatory Visit (INDEPENDENT_AMBULATORY_CARE_PROVIDER_SITE_OTHER): Admitting: Family Medicine

## 2024-07-05 VITALS — BP 112/78 | HR 67 | Ht 66.0 in | Wt 109.4 lb

## 2024-07-05 DIAGNOSIS — M1711 Unilateral primary osteoarthritis, right knee: Secondary | ICD-10-CM

## 2024-07-05 MED ORDER — DICLOFENAC SODIUM 75 MG PO TBEC: 75.0000 mg | DELAYED_RELEASE_TABLET | Freq: Two times a day (BID) | ORAL | 0 refills | Status: DC | PRN

## 2024-07-05 NOTE — Telephone Encounter (Signed)
 I have submitted benefits review for Monovisc.   JM

## 2024-07-05 NOTE — Progress Notes (Signed)
 Primary Care / Sports Medicine Office Visit  Patient Information:  Patient ID: Marilyn Molina, female DOB: 03/11/1951 Age: 73 y.o. MRN: [id number]   Marilyn Molina is a pleasant 73 y.o. female presenting with the following:  Chief Complaint  Patient presents with   Knee Pain    Right knee pain since June. Pain has got worse with swelling and possible fluid. Patient had injection last year which helped up until the first of June. Patient also mentioned right foot constantly goes to sleep.     Vitals:   07/05/24 0956  BP: 112/78  Pulse: 67  SpO2: 98%   Vitals:   07/05/24 0956  Weight: 109 lb 6.4 oz (49.6 kg)  Height: 5' 6 (1.676 m)   Body mass index is 17.66 kg/m.  No results found.   Independent interpretation of notes and tests performed by another provider:   None  Procedures performed:   None  Pertinent History, Exam, Impression, and Recommendations:   Problem List Items Addressed This Visit     Primary osteoarthritis of right knee - Primary (Chronic)   History of Present Illness Marilyn Molina is a 73 year old female with knee osteoarthritis who presents with knee pain and swelling.  Right knee pain and swelling - Knee pain and swelling recurred in June 2025 after more than six months of relief following a gel injection administered on June 19, 2023 - Symptoms limit activity levels - Less active recently due to knee pain - [place] the beach since May 07, 2024, with ongoing symptoms  Right lower extremity neuropathy - Occasional numbness in the right foot in the setting of knee pain - Experienced a near fall due to right foot numbness - No numbness elsewhere in the leg  Symptom management - Managing symptoms with leftover anti-inflammatory medication, taken sparingly - Currently taking an anti-inflammatory pill twice a day as needed, providing some relief, especially while at the beach  Physical Exam Right Knee  Exam INSPECTION: Subtle suprapatellar swelling compared to the left knee PALPATION: 1+ effusion present, Tenderness at the lateral patellar facet and superior aspect of patella at the quadriceps tendon, Medial patellar facet, lateral and medial joint lines, and patellar tendon non-tender RANGE OF MOTION (ROM) ASSESSMENT: 0-120 degrees, limited by pain, no mechanical obstruction NEUROMUSCULAR: Grossly intact SPECIAL TESTS: Increased anterior translation on anterior drawer test with firm endpoint when compared to contralateral, Posterior drawer test negative, Medial and lateral McMurray tests equivocal with posterior and medial localization, No laxity with varus or valgus stress testing  Assessment and Plan Right knee osteoarthritis with effusion and pain Chronic osteoarthritis exacerbation with effusion and pain, exacerbated since June. Previous Monovisc injection effective for roughly a year. Current effusion and limited range of motion.  Viscosupplementation injections preferred over cortisone for long-term management. - Prescribe anti-inflammatory diclofenac  75 mg twice daily with food until follow-up. - Monitor for gastrointestinal side effects; discontinue if symptoms worsen and contact our office. - We will coordinate insurance approval for Monovisc gel injection. - Plan cortisone and gel injection if symptomatic at follow-up and effusion persists. - Continue anti-inflammatory medication until gel injection is effective. - Limit and try to avoid activities exacerbating knee pain. - Contact office if symptoms worsen or new symptoms develop.  Right foot numbness associated with right knee swelling Intermittent numbness likely due to nerve compression from knee swelling. - Monitor for resolution of numbness as knee swelling decreases. - Report persistent numbness after  knee pain resolves for further evaluation.      Relevant Medications   diclofenac  (VOLTAREN ) 75 MG EC tablet      Orders & Medications Medications:  Meds ordered this encounter  Medications   diclofenac  (VOLTAREN ) 75 MG EC tablet    Sig: Take 1 tablet (75 mg total) by mouth 2 (two) times daily as needed.    Dispense:  60 tablet    Refill:  0   No orders of the defined types were placed in this encounter.    No follow-ups on file.     Selinda JINNY Ku, MD, [facility]   Primary Care Sports Medicine [facility]

## 2024-07-05 NOTE — Telephone Encounter (Signed)
 Please seek authorization for right knee viscosupplementation, previously was covered for Monovisc in 2024

## 2024-07-05 NOTE — Assessment & Plan Note (Signed)
 History of Present Illness Marilyn Molina is a 73 year old female with knee osteoarthritis who presents with knee pain and swelling.  Right knee pain and swelling - Knee pain and swelling recurred in June 2025 after more than six months of relief following a gel injection administered on June 19, 2023 - Symptoms limit activity levels - Less active recently due to knee pain - Staying at the beach since May 07, 2024, with ongoing symptoms  Right lower extremity neuropathy - Occasional numbness in the right foot in the setting of knee pain - Experienced a near fall due to right foot numbness - No numbness elsewhere in the leg  Symptom management - Managing symptoms with leftover anti-inflammatory medication, taken sparingly - Currently taking an anti-inflammatory pill twice a day as needed, providing some relief, especially while at the beach  Physical Exam Right Knee Exam INSPECTION: Subtle suprapatellar swelling compared to the left knee PALPATION: 1+ effusion present, Tenderness at the lateral patellar facet and superior aspect of patella at the quadriceps tendon, Medial patellar facet, lateral and medial joint lines, and patellar tendon non-tender RANGE OF MOTION (ROM) ASSESSMENT: 0-120 degrees, limited by pain, no mechanical obstruction NEUROMUSCULAR: Grossly intact SPECIAL TESTS: Increased anterior translation on anterior drawer test with firm endpoint when compared to contralateral, Posterior drawer test negative, Medial and lateral McMurray tests equivocal with posterior and medial localization, No laxity with varus or valgus stress testing  Assessment and Plan Right knee osteoarthritis with effusion and pain Chronic osteoarthritis exacerbation with effusion and pain, exacerbated since June. Previous Monovisc injection effective for roughly a year. Current effusion and limited range of motion.  Viscosupplementation injections preferred over cortisone for long-term  management. - Prescribe anti-inflammatory diclofenac  75 mg twice daily with food until follow-up. - Monitor for gastrointestinal side effects; discontinue if symptoms worsen and contact our office. - We will coordinate insurance approval for Monovisc gel injection. - Plan cortisone and gel injection if symptomatic at follow-up and effusion persists. - Continue anti-inflammatory medication until gel injection is effective. - Limit and try to avoid activities exacerbating knee pain. - Contact office if symptoms worsen or new symptoms develop.  Right foot numbness associated with right knee swelling Intermittent numbness likely due to nerve compression from knee swelling. - Monitor for resolution of numbness as knee swelling decreases. - Report persistent numbness after knee pain resolves for further evaluation.

## 2024-07-05 NOTE — Patient Instructions (Signed)
 Patient Plan for Post-Visit Guidance  Right Knee Osteoarthritis with Effusion and Pain  - Take diclofenac  75 mg twice daily with food until your follow-up appointment. - Watch for stomach upset or other gastrointestinal side effects. Stop the medication and contact the office if these occur or worsen. - We will arrange insurance approval for the Monovisc gel injection. - If symptoms continue and swelling persists at your next visit, a cortisone and gel injection may be given. - Continue the anti-inflammatory medication until the gel injection takes effect. - Limit activities that make your knee pain worse.  Right Foot Numbness  - Monitor for improvement in foot numbness as knee swelling decreases. - If numbness continues after knee pain and swelling improve, report this for further evaluation.  Red Flags: - If you develop severe knee pain, redness, warmth, fever, inability to move the knee, or new or worsening numbness, contact the office immediately.

## 2024-07-23 ENCOUNTER — Telehealth: Payer: Self-pay | Admitting: Family Medicine

## 2024-07-23 NOTE — Telephone Encounter (Signed)
 Copied from CRM [id number]. Topic: Clinical - Medication Question >> Jul 23, 2024 11:38 AM Emylou G wrote: Reason for CRM: Patient called checking status of right knee viscosupplementation?

## 2024-07-26 NOTE — Telephone Encounter (Signed)
 FYI Spoke with patient and let her know that under her prescription benefits insurance does not cover gel injections. We have to do buy and bill. We are placing order today for Monovisc.  JM

## 2024-07-26 NOTE — Telephone Encounter (Signed)
 Acknowledged.

## 2024-07-29 ENCOUNTER — Ambulatory Visit
Admission: RE | Admit: 2024-07-29 | Discharge: 2024-07-29 | Disposition: A | Discharge status: Home / Self Care | Source: Ambulatory Visit | Attending: Acute Care | Admitting: Acute Care

## 2024-07-29 DIAGNOSIS — F17210 Nicotine dependence, cigarettes, uncomplicated: Secondary | ICD-10-CM | POA: Diagnosis present

## 2024-07-29 DIAGNOSIS — Z87891 Personal history of nicotine dependence: Secondary | ICD-10-CM | POA: Diagnosis present

## 2024-07-29 DIAGNOSIS — Z122 Encounter for screening for malignant neoplasm of respiratory organs: Secondary | ICD-10-CM | POA: Insufficient documentation

## 2024-08-01 ENCOUNTER — Other Ambulatory Visit: Payer: Self-pay | Admitting: Family Medicine

## 2024-08-01 DIAGNOSIS — M1711 Unilateral primary osteoarthritis, right knee: Secondary | ICD-10-CM

## 2024-08-03 NOTE — Telephone Encounter (Signed)
 Requested medications are due for refill today.  yes  Requested medications are on the active medications list.  yes  Last refill. 05/05/2024 #60 0 rf  Future visit scheduled.   no  Notes to clinic.  No pcp listed. Rx written to expire 08/04/2024    Requested Prescriptions  Pending Prescriptions Disp Refills   diclofenac  (VOLTAREN ) 75 MG EC tablet [Pharmacy Med Name: DICLOFENAC  SOD EC 75 MG TAB] 60 tablet 0    Sig: TAKE 1 TABLET BY MOUTH 2 TIMES DAILY AS NEEDED.     Analgesics:  NSAIDS Failed - 08/03/2024 12:24 PM      Failed - Manual Review: Labs are only required if the patient has taken medication for more than 8 weeks.      Failed - Cr in normal range and within 360 days    Creatinine, Ser  Date Value Ref Range Status  01/07/2022 0.89 0.57 - 1.00 mg/dL Final         Failed - HGB in normal range and within 360 days    Hemoglobin  Date Value Ref Range Status  01/10/2021 13.7 11.1 - 15.9 g/dL Final         Failed - PLT in normal range and within 360 days    Platelets  Date Value Ref Range Status  01/10/2021 389 150 - 450 x10E3/uL Final         Failed - HCT in normal range and within 360 days    Hematocrit  Date Value Ref Range Status  01/10/2021 40.5 34.0 - 46.6 % Final         Failed - eGFR is 30 or above and within 360 days    GFR calc Af Amer  Date Value Ref Range Status  01/10/2021 96 >59 mL/min/1.73 Final    Comment:    **In accordance with recommendations from the NKF-ASN Task force,**   [facility] is in the process of updating its eGFR calculation to the   2021 CKD-EPI creatinine equation that estimates kidney function   without a race variable.    GFR calc non Af Amer  Date Value Ref Range Status  01/10/2021 84 >59 mL/min/1.73 Final   eGFR  Date Value Ref Range Status  01/07/2022 70 >59 mL/min/1.73 Final         Passed - Patient is not pregnant      Passed - Valid encounter within last 12 months    Recent Outpatient Visits           4 weeks ago  Primary osteoarthritis of right knee   [facility], Selinda PARAS, MD   3 months ago Acute maxillary sinusitis, recurrence not specified   [facility] Cathryne JAYSON, MD   6 months ago Nausea   [facility] Cathryne JAYSON, MD

## 2024-08-03 NOTE — Telephone Encounter (Signed)
 Please review.  KP

## 2024-08-05 ENCOUNTER — Other Ambulatory Visit (INDEPENDENT_AMBULATORY_CARE_PROVIDER_SITE_OTHER): Payer: Self-pay | Admitting: Radiology

## 2024-08-05 ENCOUNTER — Ambulatory Visit: Admitting: Family Medicine

## 2024-08-05 VITALS — BP 90/66 | HR 81 | Ht 66.0 in | Wt 106.2 lb

## 2024-08-05 DIAGNOSIS — M1711 Unilateral primary osteoarthritis, right knee: Secondary | ICD-10-CM

## 2024-08-05 DIAGNOSIS — M160 Bilateral primary osteoarthritis of hip: Secondary | ICD-10-CM | POA: Diagnosis not present

## 2024-08-05 MED ORDER — HYALURONAN 88 MG/4ML IX SOSY
88.0000 mg | PREFILLED_SYRINGE | Freq: Once | INTRA_ARTICULAR | Status: AC
Start: 2024-08-05 — End: 2024-08-05
  Administered 2024-08-05: 88 mg via INTRA_ARTICULAR

## 2024-08-05 MED ORDER — TRIAMCINOLONE ACETONIDE 40 MG/ML IJ SUSP
40.0000 mg | Freq: Once | INTRAMUSCULAR | Status: AC
  Administered 2024-08-05: 40 mg via INTRAMUSCULAR

## 2024-08-05 NOTE — Assessment & Plan Note (Signed)
 Knee pain and swelling - Severe right knee pain recurred approximately 1.5 weeks ago, acute on chronic, known underlying OA - Pain described as 'working really bad' - Associated with swelling of the knee - Uncertain etiology, possibly related to favoring the knee - History of prior cortisone injections and gel treatments with previous relief  RIGHT KNEE INSPECTION: Trace-1+ effusion noted; no erythema, ecchymosis, or deformity PALPATION: Patella nontender; medial aspect mildly tender; lateral aspect nontender; posterior aspect tender; crepitus present RANGE OF MOTION: 0-135 degrees, painful terminal flexion  Primary osteoarthritis of right knee Chronic osteoarthritis with recent pain and mild swelling exacerbation. Has done well with visco in the past, adjunct cortisone due to active ongoing pain. - Administer cortisone and gel injection to the right knee. - Instruct to take diclofenac  twice daily until injection takes effect. - Advise icing and compression wrap during the day, remove at night. - Encourage rest and reduced activity over the weekend. - Instruct to report back in two weeks or sooner if no improvement.

## 2024-08-06 NOTE — Patient Instructions (Signed)
 Bilateral Hip Osteoarthritis  - Take diclofenac  twice daily for hip pain until symptoms improve. - Monitor hip pain and report if pain persists or worsens. - If hip pain does not improve in the next few weeks, hip injections may be considered.  Right Knee Osteoarthritis  - You received cortisone and gel injections in your right knee today. - Take diclofenac  twice daily for knee pain until the injection takes effect. - Use ice and a compression wrap on your right knee during the day; remove the wrap at night. - Rest and reduce activity over the weekend. - Monitor knee pain and swelling. Report if pain or swelling persists or worsens. - Follow up in two weeks or sooner if you do not notice improvement.  Red flags: If you experience severe pain, sudden swelling, redness, warmth, fever, numbness, tingling, or sudden difficulty moving your hip or knee, contact the office promptly.

## 2024-08-06 NOTE — Assessment & Plan Note (Signed)
 Bilateral hip pain - Lower abdominal / groin pain present bilaterally - Known underlying arthritis - Aggravated by certain movements, began with increase following knee symptoms  Physical Exam BILATERAL HIPS PALPATION: Mild j tenderness bilaterally at the anterior hips, trochanteric regions nontender RANGE OF MOTION: Grossly full and symmetric with pain SPECIAL TESTS: Positive FADIR localizing anteriorly, recreating stated symptomatology, provocative testing otherwise benign  Primary osteoarthritis of bilateral hips Chronic osteoarthritis with pain exacerbated by compensatory movement due to right knee pain. Pain likely aggravated by active lifestyle and compensatory gait. - Instruct to take diclofenac  twice daily for hip pain until symptoms resolve, anticipate improvement as right knee symptoms resolve. - Advise to monitor hip pain and report persistent issues; consider hip injections if no improvement over the next few weeks.

## 2024-08-06 NOTE — Progress Notes (Signed)
 Primary Care / Sports Medicine Office Visit  Patient Information:  Patient ID: Marilyn Molina, female DOB: 1951-05-26 Age: 73 y.o. MRN: [id number]   Marilyn Molina is a pleasant 73 y.o. female presenting with the following:  No chief complaint on file.   Vitals:   08/05/24 1422  BP: 90/66  Pulse: 81  SpO2: 97%   Vitals:   08/05/24 1422  Weight: 106 lb 3.2 oz (48.2 kg)  Height: 5' 6 (1.676 m)   Body mass index is 17.14 kg/m.  No results found.   Independent interpretation of notes and tests performed by another provider:   None  Procedures performed:   Procedure:  Injection of right knee under ultrasound guidance. Ultrasound guidance utilized for anterolateral approach, joint space visualized Samsung HS60 device utilized with permanent recording / reporting. Verbal informed consent obtained and verified. Skin prepped in a sterile fashion. Ethyl chloride for topical local analgesia.  Completed without difficulty and tolerated well. Medication: triamcinolone  acetonide 40 mg/mL suspension for injection 1 mL total and 2 mL lidocaine  1% without epinephrine  utilized for needle placement anesthetic, Monovisc 88 mg (4 mL) Advised to contact for fevers/chills, erythema, induration, drainage, or persistent bleeding.  Pertinent History, Exam, Impression, and Recommendations:   Problem List Items Addressed This Visit     Bilateral primary osteoarthritis of hip   Bilateral hip pain - Lower abdominal / groin pain present bilaterally - Known underlying arthritis - Aggravated by certain movements, began with increase following knee symptoms  Physical Exam BILATERAL HIPS PALPATION: Mild j tenderness bilaterally at the anterior hips, trochanteric regions nontender RANGE OF MOTION: Grossly full and symmetric with pain SPECIAL TESTS: Positive FADIR localizing anteriorly, recreating stated symptomatology, provocative testing otherwise benign  Primary  osteoarthritis of bilateral hips Chronic osteoarthritis with pain exacerbated by compensatory movement due to right knee pain. Pain likely aggravated by active lifestyle and compensatory gait. - Instruct to take diclofenac  twice daily for hip pain until symptoms resolve, anticipate improvement as right knee symptoms resolve. - Advise to monitor hip pain and report persistent issues; consider hip injections if no improvement over the next few weeks.      Primary osteoarthritis of right knee - Primary (Chronic)   Knee pain and swelling - Severe right knee pain recurred approximately 1.5 weeks ago, acute on chronic, known underlying OA - Pain described as 'working really bad' - Associated with swelling of the knee - Uncertain etiology, possibly related to favoring the knee - History of prior cortisone injections and gel treatments with previous relief  RIGHT KNEE INSPECTION: Trace-1+ effusion noted; no erythema, ecchymosis, or deformity PALPATION: Patella nontender; medial aspect mildly tender; lateral aspect nontender; posterior aspect tender; crepitus present RANGE OF MOTION: 0-135 degrees, painful terminal flexion  Primary osteoarthritis of right knee Chronic osteoarthritis with recent pain and mild swelling exacerbation. Has done well with visco in the past, adjunct cortisone due to active ongoing pain. - Administer cortisone and gel injection to the right knee. - Instruct to take diclofenac  twice daily until injection takes effect. - Advise icing and compression wrap during the day, remove at night. - Encourage rest and reduced activity over the weekend. - Instruct to report back in two weeks or sooner if no improvement.      Relevant Orders   US  LIMITED JOINT SPACE STRUCTURES LOW RIGHT     Orders & Medications Medications:  Meds ordered this encounter  Medications   Hyaluronan (MONOVISC) intra-articular injection 88 mg  triamcinolone  acetonide (KENALOG -40) injection 40 mg    Orders Placed This Encounter  Procedures   US  LIMITED JOINT SPACE STRUCTURES LOW RIGHT     No follow-ups on file.     Selinda JINNY Ku, MD, [facility]   Primary Care Sports Medicine [facility]

## 2024-08-10 DIAGNOSIS — E782 Mixed hyperlipidemia: Secondary | ICD-10-CM | POA: Insufficient documentation

## 2024-08-10 DIAGNOSIS — M810 Age-related osteoporosis without current pathological fracture: Secondary | ICD-10-CM | POA: Insufficient documentation

## 2024-08-18 ENCOUNTER — Other Ambulatory Visit: Payer: Self-pay | Admitting: Acute Care

## 2024-08-18 DIAGNOSIS — Z87891 Personal history of nicotine dependence: Secondary | ICD-10-CM

## 2024-08-18 DIAGNOSIS — Z122 Encounter for screening for malignant neoplasm of respiratory organs: Secondary | ICD-10-CM

## 2024-08-18 DIAGNOSIS — F17210 Nicotine dependence, cigarettes, uncomplicated: Secondary | ICD-10-CM

## 2024-08-23 ENCOUNTER — Telehealth: Payer: Self-pay

## 2024-08-23 DIAGNOSIS — E440 Moderate protein-calorie malnutrition: Secondary | ICD-10-CM | POA: Insufficient documentation

## 2024-08-23 DIAGNOSIS — R7989 Other specified abnormal findings of blood chemistry: Secondary | ICD-10-CM | POA: Insufficient documentation

## 2024-08-23 DIAGNOSIS — R5381 Other malaise: Secondary | ICD-10-CM | POA: Insufficient documentation

## 2024-08-23 DIAGNOSIS — R634 Abnormal weight loss: Secondary | ICD-10-CM | POA: Insufficient documentation

## 2024-08-23 DIAGNOSIS — D72829 Elevated white blood cell count, unspecified: Secondary | ICD-10-CM | POA: Insufficient documentation

## 2024-08-23 DIAGNOSIS — R197 Diarrhea, unspecified: Secondary | ICD-10-CM | POA: Insufficient documentation

## 2024-08-23 DIAGNOSIS — R1030 Lower abdominal pain, unspecified: Secondary | ICD-10-CM | POA: Insufficient documentation

## 2024-08-23 DIAGNOSIS — D649 Anemia, unspecified: Secondary | ICD-10-CM | POA: Insufficient documentation

## 2024-08-23 DIAGNOSIS — D75839 Thrombocytosis, unspecified: Secondary | ICD-10-CM | POA: Insufficient documentation

## 2024-08-23 DIAGNOSIS — E119 Type 2 diabetes mellitus without complications: Secondary | ICD-10-CM | POA: Insufficient documentation

## 2024-08-23 DIAGNOSIS — I272 Pulmonary hypertension, unspecified: Secondary | ICD-10-CM | POA: Insufficient documentation

## 2024-08-23 DIAGNOSIS — R195 Other fecal abnormalities: Secondary | ICD-10-CM | POA: Insufficient documentation

## 2024-08-23 NOTE — Telephone Encounter (Signed)
Patient needs a TOC appointment.

## 2024-09-01 ENCOUNTER — Other Ambulatory Visit: Payer: Self-pay | Admitting: Pulmonary Disease

## 2024-09-08 DIAGNOSIS — I2699 Other pulmonary embolism without acute cor pulmonale: Secondary | ICD-10-CM | POA: Insufficient documentation

## 2024-09-08 DIAGNOSIS — Z8719 Personal history of other diseases of the digestive system: Secondary | ICD-10-CM | POA: Insufficient documentation

## 2024-09-08 DIAGNOSIS — K5904 Chronic idiopathic constipation: Secondary | ICD-10-CM | POA: Insufficient documentation

## 2024-09-08 DIAGNOSIS — Z7901 Long term (current) use of anticoagulants: Secondary | ICD-10-CM | POA: Insufficient documentation

## 2024-09-21 ENCOUNTER — Ambulatory Visit: Payer: Self-pay | Admitting: Oncology

## 2024-09-21 ENCOUNTER — Inpatient Hospital Stay

## 2024-09-21 ENCOUNTER — Inpatient Hospital Stay: Attending: Oncology | Admitting: Oncology

## 2024-09-21 ENCOUNTER — Encounter: Payer: Self-pay | Admitting: Oncology

## 2024-09-21 VITALS — BP 107/71 | HR 96 | Resp 18 | Wt 107.0 lb

## 2024-09-21 DIAGNOSIS — Z7901 Long term (current) use of anticoagulants: Secondary | ICD-10-CM | POA: Insufficient documentation

## 2024-09-21 DIAGNOSIS — D500 Iron deficiency anemia secondary to blood loss (chronic): Secondary | ICD-10-CM

## 2024-09-21 DIAGNOSIS — Z720 Tobacco use: Secondary | ICD-10-CM | POA: Diagnosis not present

## 2024-09-21 DIAGNOSIS — D72829 Elevated white blood cell count, unspecified: Secondary | ICD-10-CM | POA: Insufficient documentation

## 2024-09-21 DIAGNOSIS — D509 Iron deficiency anemia, unspecified: Secondary | ICD-10-CM | POA: Diagnosis present

## 2024-09-21 DIAGNOSIS — I2699 Other pulmonary embolism without acute cor pulmonale: Secondary | ICD-10-CM | POA: Diagnosis not present

## 2024-09-21 DIAGNOSIS — D75839 Thrombocytosis, unspecified: Secondary | ICD-10-CM | POA: Diagnosis not present

## 2024-09-21 LAB — SAMPLE TO BLOOD BANK

## 2024-09-21 LAB — RETIC PANEL
Immature Retic Fract: 37.7 % — ABNORMAL HIGH (ref 2.3–15.9)
RBC.: 2.9 MIL/uL — ABNORMAL LOW (ref 3.87–5.11)
Retic Count, Absolute: 63.2 K/uL (ref 19.0–186.0)
Retic Ct Pct: 2.2 % (ref 0.4–3.1)
Reticulocyte Hemoglobin: 24.7 pg — ABNORMAL LOW (ref >27.9)

## 2024-09-21 LAB — CBC WITH DIFFERENTIAL/PLATELET
Abs Immature Granulocytes: 0.06 K/uL (ref 0.00–0.07)
Basophils Absolute: 0.1 K/uL (ref 0.0–0.1)
Basophils Relative: 1 %
Eosinophils Absolute: 0.3 K/uL (ref 0.0–0.5)
Eosinophils Relative: 2 %
HCT: 24.5 % — ABNORMAL LOW (ref 36.0–46.0)
Hemoglobin: 7.4 g/dL — ABNORMAL LOW (ref 12.0–15.0)
Immature Granulocytes: 1 %
Lymphocytes Relative: 19 %
Lymphs Abs: 2.4 K/uL (ref 0.7–4.0)
MCH: 25.5 pg — ABNORMAL LOW (ref 26.0–34.0)
MCHC: 30.2 g/dL (ref 30.0–36.0)
MCV: 84.5 fL (ref 80.0–100.0)
Monocytes Absolute: 1 K/uL (ref 0.1–1.0)
Monocytes Relative: 8 %
Neutro Abs: 9.1 K/uL — ABNORMAL HIGH (ref 1.7–7.7)
Neutrophils Relative %: 69 %
Platelets: 634 K/uL — ABNORMAL HIGH (ref 150–400)
RBC: 2.9 MIL/uL — ABNORMAL LOW (ref 3.87–5.11)
RDW: 17.7 % — ABNORMAL HIGH (ref 11.5–15.5)
WBC: 12.9 K/uL — ABNORMAL HIGH (ref 4.0–10.5)
nRBC: 0 % (ref 0.0–0.2)

## 2024-09-21 LAB — CMP (CANCER CENTER ONLY)
ALT: 9 U/L (ref 0–44)
AST: 11 U/L — ABNORMAL LOW (ref 15–41)
Albumin: 2.6 g/dL — ABNORMAL LOW (ref 3.5–5.0)
Alkaline Phosphatase: 90 U/L (ref 38–126)
Anion gap: 8 (ref 5–15)
BUN: 16 mg/dL (ref 8–23)
CO2: 19 mmol/L — ABNORMAL LOW (ref 22–32)
Calcium: 8.4 mg/dL — ABNORMAL LOW (ref 8.9–10.3)
Chloride: 111 mmol/L (ref 98–111)
Creatinine: 0.93 mg/dL (ref 0.44–1.00)
GFR, Estimated: >60 mL/min (ref >60)
Glucose, Bld: 104 mg/dL — ABNORMAL HIGH (ref 70–99)
Potassium: 3.6 mmol/L (ref 3.5–5.1)
Sodium: 138 mmol/L (ref 135–145)
Total Bilirubin: 0.5 mg/dL (ref 0.0–1.2)
Total Protein: 6.3 g/dL — ABNORMAL LOW (ref 6.5–8.1)

## 2024-09-21 LAB — IRON AND TIBC
Iron: 13 ug/dL — ABNORMAL LOW (ref 28–170)
Saturation Ratios: 4 % — ABNORMAL LOW (ref 10.4–31.8)
TIBC: 375 ug/dL (ref 250–450)
UIBC: 362 ug/dL

## 2024-09-21 LAB — FERRITIN: Ferritin: 9 ng/mL — ABNORMAL LOW (ref 11–307)

## 2024-09-21 MED ORDER — APIXABAN 5 MG PO TABS: 5.0000 mg | ORAL_TABLET | Freq: Two times a day (BID) | ORAL | 1 refills | Status: DC

## 2024-09-21 NOTE — Addendum Note (Signed)
 Addended by: BABARA CALL on: 09/21/2024 10:17 PM   Modules accepted: Orders

## 2024-09-21 NOTE — Assessment & Plan Note (Signed)
 Chornic leukocytosis, predominantly neutrophilia. Possibly due to smoking. monitor

## 2024-09-21 NOTE — Assessment & Plan Note (Signed)
 Recommend smoke cessation.  She is up to date for lung cancer screening.

## 2024-09-21 NOTE — Assessment & Plan Note (Signed)
 Possibly due to iron deficiency. If thrombocytosis persists after iron store is corrected, consider further workup

## 2024-09-21 NOTE — Assessment & Plan Note (Addendum)
 Labs are reviewed and discussed with patient. Lab Results  Component Value Date   HGB 7.4 (L) 09/21/2024   TIBC 375 09/21/2024   IRONPCTSAT 4 (L) 09/21/2024   FERRITIN 9 (L) 09/21/2024    I recommend IV Venofer treatments. I discussed about the potential risks including but not limited to allergic reactions/infusion reactions including anaphylactic reactions, diarrhea, phlebitis, high blood pressure, wheezing, SOB, skin rash, weight gain,dark urine, leg swelling, back pain, headache, nausea and fatigue, etc. Patient agrees with IV venofer.  Plan IV venofer weekly x 4

## 2024-09-21 NOTE — Assessment & Plan Note (Addendum)
 Likely provoked secondary to recent acute illness. Continue Eliquis  5 mg twice daily.  Plan for 3 -6 months of anticoagulation.

## 2024-09-21 NOTE — Progress Notes (Addendum)
 " Hematology/Oncology Consult note [phone] [phone]        REFERRING PROVIDER: Steva Clotilda DEL, NP   CHIEF COMPLAINTS/REASON FOR VISIT:  Evaluation of anemia, and pulmonary embolism   ASSESSMENT & PLAN:   Iron  deficiency anemia Labs are reviewed and discussed with patient. Lab Results  Component Value Date   HGB 7.4 (L) 09/21/2024   TIBC 375 09/21/2024   IRONPCTSAT 4 (L) 09/21/2024   FERRITIN 9 (L) 09/21/2024    I recommend IV Venofer  treatments. I discussed about the potential risks including but not limited to allergic reactions/infusion reactions including anaphylactic reactions, diarrhea, phlebitis, high blood pressure, wheezing, SOB, skin rash, weight gain,dark urine, leg swelling, back pain, headache, nausea and fatigue, etc. Patient agrees with IV venofer .  Plan IV venofer  weekly x 4    Thrombocytosis Possibly due to iron  deficiency. If thrombocytosis persists after iron  store is corrected, consider further workup  Tobacco abuse Recommend smoke cessation.  She is up to date for lung cancer screening.  Leukocytosis Chornic leukocytosis, predominantly neutrophilia. Possibly due to smoking. monitor  Pulmonary embolism (HCC) Likely provoked secondary to recent acute illness. Continue Eliquis  5 mg twice daily.  Plan for 3 -6 months of anticoagulation.   Orders Placed This Encounter  Procedures   Ferritin    Standing Status:   Future    Number of Occurrences:   1    Expected Date:   09/21/2024    Expiration Date:   03/22/2025   Iron  and TIBC    Standing Status:   Future    Number of Occurrences:   1    Expected Date:   09/21/2024    Expiration Date:   09/21/2025   CBC with Differential/Platelet    Standing Status:   Future    Number of Occurrences:   1    Expected Date:   09/21/2024    Expiration Date:   09/21/2025   Retic Panel    Standing Status:   Future    Number of Occurrences:   1    Expected Date:   09/21/2024     Expiration Date:   09/21/2025   CMP (Cancer Center only)    Standing Status:   Future    Number of Occurrences:   1    Expected Date:   09/21/2024    Expiration Date:   12/20/2024   Hold Tube- Blood Bank    Standing Status:   Future    Number of Occurrences:   1    Expected Date:   09/21/2024    Expiration Date:   12/20/2024   Follow up in 3 months.  All questions were answered. The patient knows to call the clinic with any problems, questions or concerns.  Zelphia Cap, MD, PhD [facility] Hematology Oncology 09/21/2024   HISTORY OF PRESENTING ILLNESS:   Marilyn Molina is a  73 y.o.  female with PMH listed below was seen in consultation at the request of  Steva Clotilda DEL, NP  for evaluation of anemia  Discussed the use of AI scribe software for clinical note transcription with the patient, who gave verbal consent to proceed.    Patient had a few ED visit at [facility].  08/26/2024 She felt extremely tired and exhausted, with difficulty getting out of bed. She is unable to drink much without feeling sick. She was found to have severe anemia with hypotension. She also had experiencing significant gastrointestinal issues, including nausea, vomiting, and diarrhea. Significant weight  loss has occurred over the past few months , attributed to lack of appetite and gastrointestinal symptoms   CT abdomen pelvis w contrast showed Long segment circumferential thickening of the sigmoid colon with pericolonic stranding, suspicious for acute uncomplicated diverticulitis versus nonspecific colitis.  Patient was treated with antibotics.   CTA chest showed Segmental and segmental pulmonary emboli in the bilateral lower lobes. Multiple pulmonary nodules the largest of which measures 4 mm in the right upper lobe.  Lower extremity US  showed no DVT.   She was started on Eliquis .  08/26/2024 Hemoglobin 8.7, Hb trended in the next few days to low 7s. She also has thrombocytosis  Today she continues to feel  tired.  08/31/2024 She was seen by GI outpatient. 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   High cholesterol    Hypertension    Mild intermittent asthma    Non-ischemic cardiomyopathy (HCC)    Osteoarthritis    Postural dizziness    Smoker    Weight loss     SURGICAL HISTORY: Past Surgical History:  Procedure Laterality Date   COLONOSCOPY WITH PROPOFOL  N/A 08/18/2023   Procedure: COLONOSCOPY WITH PROPOFOL ;  Surgeon: Jinny Carmine, MD;  Location: [facility];  Service: Endoscopy;  Laterality: N/A;   OVARIAN CYST REMOVAL     POLYPECTOMY  08/18/2023   Procedure: POLYPECTOMY;  Surgeon: Jinny Carmine, MD;  Location: [facility];  Service: Endoscopy;;    SOCIAL HISTORY: Social History   Socioeconomic History   Marital status: Widowed    Spouse name: Not on file   Number of children: 2   Years of education: Not on file   Highest education level: Bachelor's degree (e.g., BA, AB, BS)  Occupational History    Comment: retired  Tobacco Use   Smoking status: Every Day    Current packs/day: 1.00    Average packs/day: 1 pack/day for 51.0 years (51.0 ttl pk-yrs)    Types: Cigarettes   Smokeless tobacco: Never   Tobacco comments:    0.5 PPD 08/26/2023 khj  Vaping Use   Vaping status: Never Used  Substance and Sexual Activity   Alcohol use: No   Drug use: No   Sexual activity: Never  Other Topics Concern   Not on file  Social History Narrative   Pt's son and granddaughter live with her.    Social Drivers of Corporate Investment Banker Strain: Low Risk  (09/08/2024)   Received from [facility]   Overall Financial Resource Strain (CARDIA)    Difficulty of Paying Living Expenses: Not hard at all  Food Insecurity: No Food Insecurity (09/18/2024)   Hunger Vital Sign    Worried About Running Out  of Food in the Last Year: Never true    Ran Out of Food in the Last Year: Never true  Transportation Needs: No Transportation Needs (09/18/2024)   PRAPARE - Administrator, Civil Service (Medical): No    Lack of Transportation (Non-Medical): No  Physical Activity: Insufficiently Active (08/05/2024)   Exercise Vital Sign    Days of Exercise per Week: 2 days    Minutes of Exercise per Session: 20 min  Stress: No Stress Concern Present (08/05/2024)   [employer] of Occupational Health - Occupational Stress Questionnaire  Feeling of Stress: Only a little  Social Connections: Moderately Integrated (08/05/2024)   Social Connection and Isolation Panel    Frequency of Communication with Friends and Family: More than three times a week    Frequency of Social Gatherings with Friends and Family: Twice a week    Attends Religious Services: 1 to 4 times per year    Active Member of [employer] or Organizations: Yes    Attends Banker Meetings: 1 to 4 times per year    Marital Status: Widowed  Intimate Partner Violence: Not At Risk (02/19/2023)   Humiliation, Afraid, Rape, and Kick questionnaire    Fear of Current or Ex-Partner: No    Emotionally Abused: No    Physically Abused: No    Sexually Abused: No    FAMILY HISTORY: Family History  Problem Relation Age of Onset   Supraventricular tachycardia Mother    Stroke Father    Breast cancer Neg Hx     ALLERGIES:  has no known allergies.  MEDICATIONS:  Current Outpatient Medications  Medication Sig Dispense Refill   albuterol  (VENTOLIN  HFA) 108 (90 Base) MCG/ACT inhaler Inhale 2 puffs into the lungs every 6 (six) hours as needed for wheezing or shortness of breath. 8.5 g 2   ascorbic acid (VITAMIN C) 500 MG tablet Take 500 mg by mouth.     atorvastatin  (LIPITOR) 10 MG tablet Take 1 tablet (10 mg total) by mouth daily. 90 tablet 1   carvedilol  (COREG ) 3.125 MG tablet Take 1 tablet (3.125 mg total) by mouth 2 (two) times  daily with a meal. 180 tablet 1   diclofenac  (VOLTAREN ) 75 MG EC tablet TAKE 1 TABLET BY MOUTH 2 TIMES DAILY AS NEEDED. 60 tablet 0   ENTRESTO 24-26 MG Take 1 tablet by mouth 2 (two) times daily. [facility]     ondansetron  (ZOFRAN ) 4 MG tablet Take 1 tablet (4 mg total) by mouth every 8 (eight) hours as needed for nausea or vomiting. 20 tablet 3   pantoprazole  (PROTONIX ) 40 MG tablet Take 1 tablet (40 mg total) by mouth daily. 30 tablet 11   polyethylene glycol powder (GLYCOLAX/MIRALAX) 17 GM/SCOOP powder Take 17 g by mouth daily. Dissolve 1 capful (17g) in 4-8 ounces of liquid and take by mouth daily.     promethazine  (PHENERGAN ) 25 MG suppository Place 25 mg rectally every 6 (six) hours as needed.     sertraline  (ZOLOFT ) 100 MG tablet Take 1 tablet (100 mg total) by mouth daily. 90 tablet 1   umeclidinium-vilanterol (ANORO ELLIPTA ) 62.5-25 MCG/ACT AEPB Inhale 1 puff into the lungs daily. 60 each 5   apixaban  (ELIQUIS ) 5 MG TABS tablet Take 1 tablet (5 mg total) by mouth 2 (two) times daily. 60 tablet 1   No current facility-administered medications for this visit.    Review of Systems  Constitutional:  Positive for fatigue. Negative for appetite change, chills and fever.  HENT:   Negative for hearing loss and voice change.   Eyes:  Negative for eye problems.  Respiratory:  Negative for chest tightness and cough.   Cardiovascular:  Negative for chest pain.  Gastrointestinal:  Negative for abdominal distention, abdominal pain and blood in stool.  Endocrine: Negative for hot flashes.  Genitourinary:  Negative for difficulty urinating and frequency.   Musculoskeletal:  Negative for arthralgias.  Skin:  Negative for itching and rash.  Neurological:  Negative for extremity weakness.  Hematological:  Negative for adenopathy.  Psychiatric/Behavioral:  Negative for confusion.  PHYSICAL EXAMINATION:  Vitals:   09/21/24 1422  BP: 107/71  Pulse: 96  Resp: 18  SpO2: 99%   Filed  Weights   09/21/24 1422  Weight: 107 lb (48.5 kg)    Physical Exam Constitutional:      General: She is not in acute distress. HENT:     Head: Normocephalic and atraumatic.  Eyes:     General: No scleral icterus. Cardiovascular:     Rate and Rhythm: Normal rate and regular rhythm.     Heart sounds: Normal heart sounds.  Pulmonary:     Effort: Pulmonary effort is normal. No respiratory distress.     Breath sounds: No wheezing.  Abdominal:     General: Bowel sounds are normal. There is no distension.     Palpations: Abdomen is soft.  Musculoskeletal:        General: No deformity. Normal range of motion.     Cervical back: Normal range of motion and neck supple.  Skin:    General: Skin is warm and dry.     Findings: No erythema or rash.  Neurological:     Mental Status: She is alert and oriented to person, place, and time. Mental status is at baseline.  Psychiatric:        Mood and Affect: Mood normal.     LABORATORY DATA:  I have reviewed the data as listed    Latest Ref Rng & Units 09/21/2024    3:02 PM 01/10/2021    9:08 AM 07/11/2015    2:59 PM  CBC  WBC 4.0 - 10.5 K/uL 12.9  10.9  13.7   Hemoglobin 12.0 - 15.0 g/dL 7.4  86.2  86.5   Hematocrit 36.0 - 46.0 % 24.5  40.5  39.5   Platelets 150 - 400 K/uL 634  389  257       Latest Ref Rng & Units 09/21/2024    3:02 PM 01/07/2022    9:39 AM 01/10/2021    9:08 AM  CMP  Glucose 70 - 99 mg/dL 895  93  96   BUN 8 - 23 mg/dL 16  13  17    Creatinine 0.44 - 1.00 mg/dL 9.06  9.10  9.25   Sodium 135 - 145 mmol/L 138  143  139   Potassium 3.5 - 5.1 mmol/L 3.6  5.0  4.6   Chloride 98 - 111 mmol/L 111  106  103   CO2 22 - 32 mmol/L 19  22  21    Calcium  8.9 - 10.3 mg/dL 8.4  9.5  9.8   Total Protein 6.5 - 8.1 g/dL 6.3  6.8    Total Bilirubin 0.0 - 1.2 mg/dL 0.5  <9.7  <9.7   Alkaline Phos 38 - 126 U/L 90  87  72   AST 15 - 41 U/L 11  15  17    ALT 0 - 44 U/L 9  11  11        RADIOGRAPHIC STUDIES: I have personally  reviewed the radiological images as listed and agreed with the findings in the report. CT CHEST LUNG CA SCREEN LOW DOSE W/O CM Result Date: 08/10/2024 CLINICAL DATA:  Current 72 pack-year smoker. EXAM: CT CHEST WITHOUT CONTRAST LOW-DOSE FOR LUNG CANCER SCREENING TECHNIQUE: Multidetector CT imaging of the chest was performed following the standard protocol without IV contrast. RADIATION DOSE REDUCTION: This exam was performed according to the departmental dose-optimization program which includes automated exposure control, adjustment of the mA and/or kV according to patient  size and/or use of iterative reconstruction technique. COMPARISON:  07/29/2023. FINDINGS: Cardiovascular: Atherosclerotic calcification of the aorta and coronary arteries. Ascending aorta measures 3.9 cm (coronal image 109). Enlarged pulmonic trunk. Heart size normal. No pericardial effusion. Mediastinum/Nodes: No pathologically enlarged mediastinal or axillary lymph nodes. Hilar regions are difficult to definitively evaluate without IV contrast. Esophagus is grossly unremarkable. Lungs/Pleura: Centrilobular emphysema. Smoking related respiratory bronchiolitis. Scattered tiny juxtapleural nodules are considered benign. Subpleural scarring in the posterior left upper lobe. Calcified granulomas. Pulmonary nodules measure 3.2 mm or less in size, as before. No new pulmonary nodules. No pleural fluid. Airway is unremarkable. Upper Abdomen: Small low-attenuation lesion in the left kidney. No specific follow-up necessary. Visualized portions of the liver, adrenal glands, left kidney, spleen, pancreas, stomach and bowel are otherwise grossly unremarkable. No upper abdominal adenopathy. Musculoskeletal: Degenerative changes in the spine. IMPRESSION: 1. Lung-RADS 2, benign appearance or behavior. Continue annual screening with low-dose chest CT without contrast in 12 months. 2. Aortic atherosclerosis (ICD10-I70.0). Coronary artery calcification. 3.  Enlarged pulmonic trunk, indicative of pulmonary arterial hypertension. 4.  Emphysema (ICD10-J43.9). Electronically Signed   By: Newell Eke M.D.   On: 08/10/2024 11:33   US  LIMITED JOINT SPACE STRUCTURES LOW RIGHT Result Date: 08/06/2024 Procedure:  Injection of right knee under ultrasound guidance. Ultrasound guidance utilized for anterolateral approach, joint space visualized Samsung HS60 device utilized with permanent recording / reporting. Verbal informed consent obtained and verified. Skin prepped in a sterile fashion. Ethyl chloride for topical local analgesia. Completed without difficulty and tolerated well. Medication: triamcinolone  acetonide 40 mg/mL suspension for injection 1 mL total and 2 mL lidocaine  1% without epinephrine  utilized for needle placement anesthetic, Monovisc 88 mg (4 mL) Advised to contact for fevers/chills, erythema, induration, drainage, or persistent bleeding.         "

## 2024-09-22 NOTE — Telephone Encounter (Signed)
-----   Message from Zelphia Cap sent at 09/21/2024 10:18 PM EDT ----- Please arrange patient to get IV venofer weekly x 4. First dose as soon as possible, prefer this week.  Follow up in 2 months lab prior to MD + Venofer. Labs are ordered.  ----- Message ----- From: Rebecka, Lab In [place] Sent: 09/21/2024   3:18 PM EDT To: Zelphia Cap, MD

## 2024-09-22 NOTE — Telephone Encounter (Signed)
 Please contact pt to schedule:   IV venofer weekly x4 (first dose ASAP) 2 months: labs prior to MD/ +/- Venofer

## 2024-09-27 ENCOUNTER — Inpatient Hospital Stay

## 2024-09-27 VITALS — BP 107/58 | HR 75 | Temp 97.0°F | Resp 16

## 2024-09-27 DIAGNOSIS — D509 Iron deficiency anemia, unspecified: Secondary | ICD-10-CM | POA: Diagnosis not present

## 2024-09-27 DIAGNOSIS — D500 Iron deficiency anemia secondary to blood loss (chronic): Secondary | ICD-10-CM

## 2024-09-27 MED ORDER — IRON SUCROSE 20 MG/ML IV SOLN
200.0000 mg | Freq: Once | INTRAVENOUS | Status: AC
  Administered 2024-09-27: 200 mg via INTRAVENOUS
  Filled 2024-09-27: qty 10

## 2024-09-27 NOTE — Patient Instructions (Signed)

## 2024-10-04 ENCOUNTER — Inpatient Hospital Stay: Attending: Oncology

## 2024-10-04 VITALS — BP 108/69 | HR 69 | Temp 97.3°F | Resp 18

## 2024-10-04 DIAGNOSIS — D509 Iron deficiency anemia, unspecified: Secondary | ICD-10-CM | POA: Insufficient documentation

## 2024-10-04 DIAGNOSIS — D500 Iron deficiency anemia secondary to blood loss (chronic): Secondary | ICD-10-CM

## 2024-10-04 MED ORDER — IRON SUCROSE 20 MG/ML IV SOLN
200.0000 mg | Freq: Once | INTRAVENOUS | Status: AC
  Administered 2024-10-04: 200 mg via INTRAVENOUS
  Filled 2024-10-04: qty 10

## 2024-10-04 NOTE — Patient Instructions (Signed)

## 2024-10-11 ENCOUNTER — Inpatient Hospital Stay

## 2024-10-11 VITALS — BP 112/63 | HR 85 | Temp 98.7°F | Resp 16

## 2024-10-11 DIAGNOSIS — D509 Iron deficiency anemia, unspecified: Secondary | ICD-10-CM | POA: Diagnosis not present

## 2024-10-11 DIAGNOSIS — D500 Iron deficiency anemia secondary to blood loss (chronic): Secondary | ICD-10-CM

## 2024-10-11 MED ORDER — IRON SUCROSE 20 MG/ML IV SOLN
200.0000 mg | Freq: Once | INTRAVENOUS | Status: AC
  Administered 2024-10-11: 200 mg via INTRAVENOUS
  Filled 2024-10-11: qty 10

## 2024-10-11 NOTE — Patient Instructions (Signed)

## 2024-10-18 ENCOUNTER — Inpatient Hospital Stay

## 2024-10-19 ENCOUNTER — Inpatient Hospital Stay

## 2024-10-19 VITALS — BP 123/73 | HR 64 | Temp 97.4°F | Resp 18

## 2024-10-19 DIAGNOSIS — D500 Iron deficiency anemia secondary to blood loss (chronic): Secondary | ICD-10-CM

## 2024-10-19 DIAGNOSIS — D509 Iron deficiency anemia, unspecified: Secondary | ICD-10-CM | POA: Diagnosis not present

## 2024-10-19 MED ORDER — IRON SUCROSE 20 MG/ML IV SOLN
200.0000 mg | Freq: Once | INTRAVENOUS | Status: AC
  Administered 2024-10-19: 200 mg via INTRAVENOUS
  Filled 2024-10-19: qty 10

## 2024-10-19 NOTE — Patient Instructions (Signed)

## 2024-11-15 ENCOUNTER — Inpatient Hospital Stay: Attending: Oncology

## 2024-11-15 DIAGNOSIS — D509 Iron deficiency anemia, unspecified: Secondary | ICD-10-CM | POA: Insufficient documentation

## 2024-11-15 LAB — CBC WITH DIFFERENTIAL/PLATELET
Abs Immature Granulocytes: 0.04 K/uL (ref 0.00–0.07)
Basophils Absolute: 0.1 K/uL (ref 0.0–0.1)
Basophils Relative: 1 %
Eosinophils Absolute: 0.2 K/uL (ref 0.0–0.5)
Eosinophils Relative: 3 %
HCT: 29.7 % — ABNORMAL LOW (ref 36.0–46.0)
Hemoglobin: 8.7 g/dL — ABNORMAL LOW (ref 12.0–15.0)
Immature Granulocytes: 1 %
Lymphocytes Relative: 19 %
Lymphs Abs: 1.6 K/uL (ref 0.7–4.0)
MCH: 26.9 pg (ref 26.0–34.0)
MCHC: 29.3 g/dL — ABNORMAL LOW (ref 30.0–36.0)
MCV: 92 fL (ref 80.0–100.0)
Monocytes Absolute: 0.7 K/uL (ref 0.1–1.0)
Monocytes Relative: 8 %
Neutro Abs: 6 K/uL (ref 1.7–7.7)
Neutrophils Relative %: 68 %
Platelets: 791 K/uL — ABNORMAL HIGH (ref 150–400)
RBC: 3.23 MIL/uL — ABNORMAL LOW (ref 3.87–5.11)
RDW: 23.6 % — ABNORMAL HIGH (ref 11.5–15.5)
WBC: 8.6 K/uL (ref 4.0–10.5)
nRBC: 0 % (ref 0.0–0.2)

## 2024-11-15 LAB — IRON AND TIBC
Iron: 30 ug/dL (ref 28–170)
Saturation Ratios: 11 % (ref 10.4–31.8)
TIBC: 281 ug/dL (ref 250–450)
UIBC: 251 ug/dL

## 2024-11-15 LAB — FERRITIN: Ferritin: 88 ng/mL (ref 11–307)

## 2024-11-21 ENCOUNTER — Other Ambulatory Visit: Payer: Self-pay | Admitting: Oncology

## 2024-11-22 ENCOUNTER — Inpatient Hospital Stay: Admitting: Oncology

## 2024-11-22 ENCOUNTER — Encounter: Payer: Self-pay | Admitting: Oncology

## 2024-11-22 ENCOUNTER — Inpatient Hospital Stay

## 2024-11-22 VITALS — BP 120/72 | HR 80 | Temp 96.8°F | Resp 18 | Wt 108.3 lb

## 2024-11-22 VITALS — BP 107/61 | HR 68

## 2024-11-22 DIAGNOSIS — D500 Iron deficiency anemia secondary to blood loss (chronic): Secondary | ICD-10-CM

## 2024-11-22 DIAGNOSIS — D75839 Thrombocytosis, unspecified: Secondary | ICD-10-CM

## 2024-11-22 DIAGNOSIS — I2699 Other pulmonary embolism without acute cor pulmonale: Secondary | ICD-10-CM | POA: Diagnosis not present

## 2024-11-22 DIAGNOSIS — Z720 Tobacco use: Secondary | ICD-10-CM | POA: Diagnosis not present

## 2024-11-22 DIAGNOSIS — D509 Iron deficiency anemia, unspecified: Secondary | ICD-10-CM | POA: Diagnosis not present

## 2024-11-22 LAB — VITAMIN B12: Vitamin B-12: 854 pg/mL (ref 180–914)

## 2024-11-22 LAB — LACTATE DEHYDROGENASE: LDH: 144 U/L (ref 105–235)

## 2024-11-22 LAB — FOLATE: Folate: 8.1 ng/mL

## 2024-11-22 MED ORDER — IRON SUCROSE 20 MG/ML IV SOLN
200.0000 mg | Freq: Once | INTRAVENOUS | Status: AC
  Administered 2024-11-22: 200 mg via INTRAVENOUS

## 2024-11-22 NOTE — Assessment & Plan Note (Addendum)
 Labs are reviewed and discussed with patient. Lab Results  Component Value Date   HGB 8.7 (L) 11/15/2024   TIBC 281 11/15/2024   IRONPCTSAT 11 11/15/2024   FERRITIN 88 11/15/2024    S/p IV venofer  weekly x 4, she tolerates well.  Hemoglobin slightly improved today 8.7.  Iron  panel has improved.  This raises concern about other etiology of anemia. I recommend additional Venofer  weekly x 2 to further improve the iron  stores and hopefully improve hemoglobin. Check multiple myeloma panel, light chain ratio, B12, folate, LDH, haptoglobin

## 2024-11-22 NOTE — Assessment & Plan Note (Addendum)
 Possibly due to iron  deficiency, smoking.  However thrombocytosis does not improve despite improvement of iron  panel. Check BCR-ABL 1 FISH, Jak 2 mutation with reflex

## 2024-11-22 NOTE — Assessment & Plan Note (Signed)
 Likely provoked secondary to recent acute illness. Patient has finished 3+ months of anticoagulation. Recommend Eliquis  5 mg twice daily for another 3 months and then stop.

## 2024-11-22 NOTE — Progress Notes (Signed)
 " Hematology/Oncology Consult note [phone] Fax:([phone]) [phone]         CHIEF COMPLAINTS/REASON FOR VISIT:  anemia, and pulmonary embolism   ASSESSMENT & PLAN:   Iron  deficiency anemia Labs are reviewed and discussed with patient. Lab Results  Component Value Date   HGB 8.7 (L) 11/15/2024   TIBC 281 11/15/2024   IRONPCTSAT 11 11/15/2024   FERRITIN 88 11/15/2024    S/p IV venofer  weekly x 4, she tolerates well.  Hemoglobin slightly improved today 8.7.  Iron  panel has improved.  This raises concern about other etiology of anemia. I recommend additional Venofer  weekly x 2 to further improve the iron  stores and hopefully improve hemoglobin. Check multiple myeloma panel, light chain ratio, B12, folate, LDH, haptoglobin   Pulmonary embolism (HCC) Likely provoked secondary to recent acute illness. Patient has finished 3+ months of anticoagulation. Recommend Eliquis  5 mg twice daily for another 3 months and then stop.  Thrombocytosis Possibly due to iron  deficiency, smoking.  However thrombocytosis does not improve despite improvement of iron  panel. Check BCR-ABL 1 FISH, Jak 2 mutation with reflex  Tobacco abuse Recommend smoke cessation.  She is up to date for lung cancer screening.   Orders Placed This Encounter  Procedures   Kappa/lambda light chains    Standing Status:   Future    Number of Occurrences:   1    Expected Date:   11/22/2024    Expiration Date:   11/22/2025   Multiple Myeloma Panel (SPEP&IFE w/QIG)    Standing Status:   Future    Number of Occurrences:   1    Expected Date:   11/22/2024    Expiration Date:   11/22/2025   Vitamin B12    Standing Status:   Future    Number of Occurrences:   1    Expected Date:   11/22/2024    Expiration Date:   11/22/2025   Folate    Standing Status:   Future    Number of Occurrences:   1    Expected Date:   11/22/2024    Expiration Date:   11/22/2025   Lactate dehydrogenase    Standing Status:    Future    Number of Occurrences:   1    Expected Date:   11/22/2024    Expiration Date:   11/22/2025   Haptoglobin    Standing Status:   Future    Number of Occurrences:   1    Expected Date:   11/22/2024    Expiration Date:   11/22/2025   JAK2 V617F rfx CALR/MPL/E12-15    Standing Status:   Future    Number of Occurrences:   1    Expected Date:   11/22/2024    Expiration Date:   11/22/2025   BCR-ABL1 FISH    Standing Status:   Future    Number of Occurrences:   1    Expected Date:   11/22/2024    Expiration Date:   11/22/2025   CBC with Differential (Cancer Center Only)    Standing Status:   Future    Expected Date:   12/20/2024    Expiration Date:   03/20/2025   Iron  and TIBC    Standing Status:   Future    Expected Date:   12/20/2024    Expiration Date:   03/20/2025   Ferritin    Standing Status:   Future    Expected Date:   12/20/2024    Expiration Date:  03/20/2025   Retic Panel    Standing Status:   Future    Expected Date:   12/20/2024    Expiration Date:   03/20/2025   Follow up in 1 month All questions were answered. The patient knows to call the clinic with any problems, questions or concerns.  Zelphia Cap, MD, PhD [facility] Hematology Oncology 11/22/2024   HISTORY OF PRESENTING ILLNESS:   Marilyn Molina is a  73 y.o.  female with PMH listed below was seen in consultation at the request of  Cap Zelphia, MD  for evaluation of anemia  Discussed the use of AI scribe software for clinical note transcription with the patient, who gave verbal consent to proceed.    Patient had a few ED visit at [facility].  08/26/2024 She felt extremely tired and exhausted, with difficulty getting out of bed. She is unable to drink much without feeling sick. She was found to have severe anemia with hypotension. She also had experiencing significant gastrointestinal issues, including nausea, vomiting, and diarrhea. Significant weight loss has occurred over the past few months , attributed to  lack of appetite and gastrointestinal symptoms   CT abdomen pelvis w contrast showed Long segment circumferential thickening of the sigmoid colon with pericolonic stranding, suspicious for acute uncomplicated diverticulitis versus nonspecific colitis.  Patient was treated with antibotics.   CTA chest showed Segmental and segmental pulmonary emboli in the bilateral lower lobes. Multiple pulmonary nodules the largest of which measures 4 mm in the right upper lobe.  Lower extremity US  showed no DVT.   She was started on Eliquis .  08/26/2024 Hemoglobin 8.7, Hb trended in the next few days to low 7s. She also has thrombocytosis  08/18/2023 colonoscopy showed 4 mm polyp in the sigmoid colon.  Polyp were resected and retrieved.  Nonbleeding internal hemorrhoids.  Diverticulosis.  September 2025, patient was seen by gastroenterology outpatient.  For chronic constipation she was recommended to utilize MiraLAX and high-fiber diet.  History of diverticulitis treated with antibiotics.  Recommend high-fiber diet.  INTERVAL HISTORY Marilyn Molina is a 73 y.o. female who has above history reviewed by me today presents for follow up visit for anemia  Today patient reports that fatigue has improved after IV Venofer  treatments.  She tolerated treatments well. Denies any rectal bleeding or abdominal pain.   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   High cholesterol    Hypertension    Mild intermittent asthma    Non-ischemic cardiomyopathy (HCC)    Osteoarthritis    Postural dizziness    Smoker    Weight loss     SURGICAL HISTORY: Past Surgical History:  Procedure Laterality Date   COLONOSCOPY WITH PROPOFOL  N/A 08/18/2023   Procedure: COLONOSCOPY WITH PROPOFOL ;  Surgeon: Jinny Carmine, MD;  Location: [facility];  Service: Endoscopy;  Laterality: N/A;   OVARIAN CYST  REMOVAL     POLYPECTOMY  08/18/2023   Procedure: POLYPECTOMY;  Surgeon: Jinny Carmine, MD;  Location: [facility];  Service: Endoscopy;;    SOCIAL HISTORY: Social History   Socioeconomic History   Marital status: Widowed    Spouse name: Not on file   Number of children: 2   Years of education: Not on file   Highest education level: Bachelor's degree (e.g., BA, AB, BS)  Occupational History  Comment: retired  Tobacco Use   Smoking status: Every Day    Current packs/day: 1.00    Average packs/day: 1 pack/day for 51.0 years (51.0 ttl pk-yrs)    Types: Cigarettes   Smokeless tobacco: Never   Tobacco comments:    0.5 PPD 08/26/2023 khj  Vaping Use   Vaping status: Never Used  Substance and Sexual Activity   Alcohol use: No   Drug use: No   Sexual activity: Never  Other Topics Concern   Not on file  Social History Narrative   Pt's son and granddaughter live with her.    Social Drivers of Health   Tobacco Use: High Risk (11/22/2024)   Patient History    Smoking Tobacco Use: Every Day    Smokeless Tobacco Use: Never    Passive Exposure: Not on file  Financial Resource Strain: Low Risk  (09/08/2024)   Received from [facility]   Overall Financial Resource Strain (CARDIA)    Difficulty of Paying Living Expenses: Not hard at all  Food Insecurity: No Food Insecurity (09/18/2024)   Epic    Worried About Running Out of Food in the Last Year: Never true    Ran Out of Food in the Last Year: Never true  Transportation Needs: No Transportation Needs (09/18/2024)   Epic    Lack of Transportation (Medical): No    Lack of Transportation (Non-Medical): No  Physical Activity: Insufficiently Active (08/05/2024)   Exercise Vital Sign    Days of Exercise per Week: 2 days    Minutes of Exercise per Session: 20 min  Stress: No Stress Concern Present (08/05/2024)   [employer] of Occupational Health - Occupational Stress Questionnaire    Feeling of Stress:  Only a little  Social Connections: Moderately Integrated (08/05/2024)   Social Connection and Isolation Panel    Frequency of Communication with Friends and Family: More than three times a week    Frequency of Social Gatherings with Friends and Family: Twice a week    Attends Religious Services: 1 to 4 times per year    Active Member of [employer] or Organizations: Yes    Attends Banker Meetings: 1 to 4 times per year    Marital Status: Widowed  Intimate Partner Violence: Not At Risk (02/19/2023)   Humiliation, Afraid, Rape, and Kick questionnaire    Fear of Current or Ex-Partner: No    Emotionally Abused: No    Physically Abused: No    Sexually Abused: No  Depression (PHQ2-9): Low Risk (10/19/2024)   Depression (PHQ2-9)    PHQ-2 Score: 0  Alcohol Screen: Low Risk (08/05/2024)   Alcohol Screen    Last Alcohol Screening Score (AUDIT): 1  Housing: Low Risk (09/18/2024)   Epic    Unable to Pay for Housing in the Last Year: No    Number of Times Moved in the Last Year: 0    Homeless in the Last Year: No  Utilities: Not At Risk (09/18/2024)   Epic    Threatened with loss of utilities: No  Health Literacy: Adequate Health Literacy (08/10/2024)   Received from [facility]   [phone] Health Literacy    How often do you need to have someone help you when you read instructions, pamphlets, or other written material from your doctor or pharmacy?: Never    FAMILY HISTORY: Family History  Problem Relation Age of Onset   Supraventricular tachycardia Mother    Stroke Father    Breast cancer  Neg Hx     ALLERGIES:  has no known allergies.  MEDICATIONS:  Current Outpatient Medications  Medication Sig Dispense Refill   albuterol  (VENTOLIN  HFA) 108 (90 Base) MCG/ACT inhaler Inhale 2 puffs into the lungs every 6 (six) hours as needed for wheezing or shortness of breath. 8.5 g 2   atorvastatin  (LIPITOR) 10 MG tablet Take 1 tablet (10 mg total) by mouth daily. 90 tablet 1    carvedilol  (COREG ) 3.125 MG tablet Take 1 tablet (3.125 mg total) by mouth 2 (two) times daily with a meal. 180 tablet 1   diclofenac  (VOLTAREN ) 75 MG EC tablet TAKE 1 TABLET BY MOUTH 2 TIMES DAILY AS NEEDED. 60 tablet 0   ENTRESTO 24-26 MG Take 1 tablet by mouth 2 (two) times daily. [facility]     ondansetron  (ZOFRAN ) 4 MG tablet Take 1 tablet (4 mg total) by mouth every 8 (eight) hours as needed for nausea or vomiting. 20 tablet 3   pantoprazole  (PROTONIX ) 40 MG tablet Take 1 tablet (40 mg total) by mouth daily. 30 tablet 11   polyethylene glycol powder (GLYCOLAX/MIRALAX) 17 GM/SCOOP powder Take 17 g by mouth daily. Dissolve 1 capful (17g) in 4-8 ounces of liquid and take by mouth daily.     sertraline  (ZOLOFT ) 100 MG tablet Take 1 tablet (100 mg total) by mouth daily. 90 tablet 1   umeclidinium-vilanterol (ANORO ELLIPTA ) 62.5-25 MCG/ACT AEPB Inhale 1 puff into the lungs daily. 60 each 5   ELIQUIS  5 MG TABS tablet TAKE 1 TABLET BY MOUTH TWICE A DAY 60 tablet 1   No current facility-administered medications for this visit.    Review of Systems  Constitutional:  Positive for fatigue. Negative for appetite change, chills and fever.  HENT:   Negative for hearing loss and voice change.   Eyes:  Negative for eye problems.  Respiratory:  Negative for chest tightness and cough.   Cardiovascular:  Negative for chest pain.  Gastrointestinal:  Negative for abdominal distention, abdominal pain and blood in stool.  Endocrine: Negative for hot flashes.  Genitourinary:  Negative for difficulty urinating and frequency.   Musculoskeletal:  Negative for arthralgias.  Skin:  Negative for itching and rash.  Neurological:  Negative for extremity weakness.  Hematological:  Negative for adenopathy.  Psychiatric/Behavioral:  Negative for confusion.    PHYSICAL EXAMINATION:  Vitals:   11/22/24 1005  BP: 120/72  Pulse: 80  Resp: 18  Temp: (!) 96.8 F (36 C)  SpO2: 99%   Filed Weights   11/22/24  1005  Weight: 108 lb 4.8 oz (49.1 kg)    Physical Exam Constitutional:      General: She is not in acute distress. HENT:     Head: Normocephalic and atraumatic.  Eyes:     General: No scleral icterus. Cardiovascular:     Rate and Rhythm: Normal rate and regular rhythm.     Heart sounds: Normal heart sounds.  Pulmonary:     Effort: Pulmonary effort is normal. No respiratory distress.     Breath sounds: No wheezing.  Abdominal:     General: Bowel sounds are normal. There is no distension.     Palpations: Abdomen is soft.  Musculoskeletal:        General: Normal range of motion.     Cervical back: Normal range of motion and neck supple.  Skin:    Findings: No erythema or rash.  Neurological:     Mental Status: She is alert and oriented to person,  place, and time. Mental status is at baseline.  Psychiatric:        Mood and Affect: Mood normal.     LABORATORY DATA:  I have reviewed the data as listed    Latest Ref Rng & Units 11/15/2024   10:13 AM 09/21/2024    3:02 PM 01/10/2021    9:08 AM  CBC  WBC 4.0 - 10.5 K/uL 8.6  12.9  10.9   Hemoglobin 12.0 - 15.0 g/dL 8.7  7.4  86.2   Hematocrit 36.0 - 46.0 % 29.7  24.5  40.5   Platelets 150 - 400 K/uL 791  634  389       Latest Ref Rng & Units 09/21/2024    3:02 PM 01/07/2022    9:39 AM 01/10/2021    9:08 AM  CMP  Glucose 70 - 99 mg/dL 895  93  96   BUN 8 - 23 mg/dL 16  13  17    Creatinine 0.44 - 1.00 mg/dL 9.06  9.10  9.25   Sodium 135 - 145 mmol/L 138  143  139   Potassium 3.5 - 5.1 mmol/L 3.6  5.0  4.6   Chloride 98 - 111 mmol/L 111  106  103   CO2 22 - 32 mmol/L 19  22  21    Calcium  8.9 - 10.3 mg/dL 8.4  9.5  9.8   Total Protein 6.5 - 8.1 g/dL 6.3  6.8    Total Bilirubin 0.0 - 1.2 mg/dL 0.5  <9.7  <9.7   Alkaline Phos 38 - 126 U/L 90  87  72   AST 15 - 41 U/L 11  15  17    ALT 0 - 44 U/L 9  11  11        RADIOGRAPHIC STUDIES: I have personally reviewed the radiological images as listed and agreed with the  findings in the report. No results found.        "

## 2024-11-22 NOTE — Assessment & Plan Note (Signed)
 Recommend smoke cessation.  She is up to date for lung cancer screening.

## 2024-11-23 LAB — KAPPA/LAMBDA LIGHT CHAINS
Kappa free light chain: 42.3 mg/L — ABNORMAL HIGH (ref 3.3–19.4)
Kappa, lambda light chain ratio: 1.31 (ref 0.26–1.65)
Lambda free light chains: 32.3 mg/L — ABNORMAL HIGH (ref 5.7–26.3)

## 2024-11-23 LAB — HAPTOGLOBIN: Haptoglobin: 284 mg/dL (ref 42–346)

## 2024-11-24 LAB — MULTIPLE MYELOMA PANEL, SERUM
Albumin SerPl Elph-Mcnc: 2.5 g/dL — ABNORMAL LOW (ref 2.9–4.4)
Albumin/Glob SerPl: 0.9 (ref 0.7–1.7)
Alpha 1: 0.3 g/dL (ref 0.0–0.4)
Alpha2 Glob SerPl Elph-Mcnc: 0.9 g/dL (ref 0.4–1.0)
B-Globulin SerPl Elph-Mcnc: 0.9 g/dL (ref 0.7–1.3)
Gamma Glob SerPl Elph-Mcnc: 0.9 g/dL (ref 0.4–1.8)
Globulin, Total: 3 g/dL (ref 2.2–3.9)
IgA: 270 mg/dL (ref 64–422)
IgG (Immunoglobin G), Serum: 1063 mg/dL (ref 586–1602)
IgM (Immunoglobulin M), Srm: 117 mg/dL (ref 26–217)
Total Protein ELP: 5.5 g/dL — ABNORMAL LOW (ref 6.0–8.5)

## 2024-11-29 LAB — BCR-ABL1 FISH
Cells Analyzed: 200
Cells Counted: 200

## 2024-12-01 ENCOUNTER — Inpatient Hospital Stay

## 2024-12-01 VITALS — BP 114/69 | HR 79 | Temp 97.2°F

## 2024-12-01 DIAGNOSIS — D509 Iron deficiency anemia, unspecified: Secondary | ICD-10-CM | POA: Diagnosis not present

## 2024-12-01 DIAGNOSIS — D500 Iron deficiency anemia secondary to blood loss (chronic): Secondary | ICD-10-CM

## 2024-12-01 MED ORDER — SODIUM CHLORIDE 0.9% FLUSH
10.0000 mL | Freq: Once | INTRAVENOUS | Status: AC | PRN
  Administered 2024-12-01: 10 mL
  Filled 2024-12-01: qty 10

## 2024-12-01 MED ORDER — IRON SUCROSE 20 MG/ML IV SOLN
200.0000 mg | Freq: Once | INTRAVENOUS | Status: AC
  Administered 2024-12-01: 200 mg via INTRAVENOUS
  Filled 2024-12-01: qty 10

## 2024-12-09 LAB — JAK2 V617F RFX CALR/MPL/E12-15

## 2024-12-09 LAB — CALR +MPL + E12-E15  (REFLEX)

## 2024-12-24 ENCOUNTER — Encounter: Payer: Self-pay | Admitting: Oncology

## 2025-01-07 ENCOUNTER — Inpatient Hospital Stay: Attending: Oncology

## 2025-01-07 DIAGNOSIS — D500 Iron deficiency anemia secondary to blood loss (chronic): Secondary | ICD-10-CM

## 2025-01-07 LAB — IRON AND TIBC
Iron: 27 ug/dL — ABNORMAL LOW (ref 28–170)
Saturation Ratios: 7 % — ABNORMAL LOW (ref 10.4–31.8)
TIBC: 363 ug/dL (ref 250–450)
UIBC: 336 ug/dL

## 2025-01-07 LAB — CBC WITH DIFFERENTIAL (CANCER CENTER ONLY)
Abs Immature Granulocytes: 0.05 10*3/uL (ref 0.00–0.07)
Basophils Absolute: 0.1 10*3/uL (ref 0.0–0.1)
Basophils Relative: 1 %
Eosinophils Absolute: 0.3 10*3/uL (ref 0.0–0.5)
Eosinophils Relative: 4 %
HCT: 33.4 % — ABNORMAL LOW (ref 36.0–46.0)
Hemoglobin: 10.4 g/dL — ABNORMAL LOW (ref 12.0–15.0)
Immature Granulocytes: 1 %
Lymphocytes Relative: 30 %
Lymphs Abs: 2.2 10*3/uL (ref 0.7–4.0)
MCH: 29.2 pg (ref 26.0–34.0)
MCHC: 31.1 g/dL (ref 30.0–36.0)
MCV: 93.8 fL (ref 80.0–100.0)
Monocytes Absolute: 0.6 10*3/uL (ref 0.1–1.0)
Monocytes Relative: 7 %
Neutro Abs: 4.3 10*3/uL (ref 1.7–7.7)
Neutrophils Relative %: 57 %
Platelet Count: 471 10*3/uL — ABNORMAL HIGH (ref 150–400)
RBC: 3.56 MIL/uL — ABNORMAL LOW (ref 3.87–5.11)
RDW: 19.8 % — ABNORMAL HIGH (ref 11.5–15.5)
WBC Count: 7.4 10*3/uL (ref 4.0–10.5)
nRBC: 0 % (ref 0.0–0.2)

## 2025-01-07 LAB — RETIC PANEL
Immature Retic Fract: 21.7 % — ABNORMAL HIGH (ref 2.3–15.9)
RBC.: 3.57 MIL/uL — ABNORMAL LOW (ref 3.87–5.11)
Retic Count, Absolute: 45 10*3/uL (ref 19.0–186.0)
Retic Ct Pct: 1.3 % (ref 0.4–3.1)
Reticulocyte Hemoglobin: 30 pg

## 2025-01-07 LAB — FERRITIN: Ferritin: 89 ng/mL (ref 11–307)

## 2025-01-10 ENCOUNTER — Inpatient Hospital Stay: Admitting: Oncology

## 2025-01-10 ENCOUNTER — Inpatient Hospital Stay
